# Patient Record
Sex: Female | Born: 1960 | Race: White | Hispanic: No | Marital: Married | Smoking: Never smoker
Health system: Southern US, Community
[De-identification: ages and names within clinical notes are randomized; demographics above are authoritative.]

## PROBLEM LIST (undated history)

## (undated) DIAGNOSIS — E559 Vitamin D deficiency, unspecified: Secondary | ICD-10-CM

## (undated) DIAGNOSIS — T4145XA Adverse effect of unspecified anesthetic, initial encounter: Secondary | ICD-10-CM

## (undated) DIAGNOSIS — Z87442 Personal history of urinary calculi: Secondary | ICD-10-CM

## (undated) DIAGNOSIS — R519 Headache, unspecified: Secondary | ICD-10-CM

## (undated) DIAGNOSIS — Z789 Other specified health status: Secondary | ICD-10-CM

## (undated) DIAGNOSIS — N200 Calculus of kidney: Secondary | ICD-10-CM

## (undated) DIAGNOSIS — R51 Headache: Secondary | ICD-10-CM

## (undated) DIAGNOSIS — T8859XA Other complications of anesthesia, initial encounter: Secondary | ICD-10-CM

## (undated) DIAGNOSIS — M419 Scoliosis, unspecified: Secondary | ICD-10-CM

## (undated) DIAGNOSIS — M1990 Unspecified osteoarthritis, unspecified site: Secondary | ICD-10-CM

## (undated) DIAGNOSIS — K219 Gastro-esophageal reflux disease without esophagitis: Secondary | ICD-10-CM

## (undated) DIAGNOSIS — I10 Essential (primary) hypertension: Secondary | ICD-10-CM

## (undated) HISTORY — DX: Vitamin D deficiency, unspecified: E55.9

## (undated) HISTORY — PX: FOOT SURGERY: SHX648

## (undated) HISTORY — PX: TUBAL LIGATION: SHX77

## (undated) HISTORY — PX: CHOLECYSTECTOMY: SHX55

## (undated) HISTORY — DX: Calculus of kidney: N20.0

## (undated) HISTORY — PX: ANKLE SURGERY: SHX546

## (undated) HISTORY — PX: KNEE SURGERY: SHX244

---

## 1996-10-12 HISTORY — PX: OTHER SURGICAL HISTORY: SHX169

## 2002-11-21 ENCOUNTER — Inpatient Hospital Stay (HOSPITAL_COMMUNITY): Admission: RE | Admit: 2002-11-21 | Discharge: 2002-11-22 | Payer: Self-pay | Admitting: Orthopedic Surgery

## 2002-11-21 ENCOUNTER — Encounter (INDEPENDENT_AMBULATORY_CARE_PROVIDER_SITE_OTHER): Payer: Self-pay

## 2002-12-20 ENCOUNTER — Ambulatory Visit (HOSPITAL_COMMUNITY): Admission: RE | Admit: 2002-12-20 | Discharge: 2002-12-20 | Payer: Self-pay | Admitting: Orthopedic Surgery

## 2003-07-23 ENCOUNTER — Ambulatory Visit (HOSPITAL_COMMUNITY): Admission: RE | Admit: 2003-07-23 | Discharge: 2003-07-23 | Payer: Self-pay | Admitting: Orthopedic Surgery

## 2003-07-23 ENCOUNTER — Encounter (INDEPENDENT_AMBULATORY_CARE_PROVIDER_SITE_OTHER): Payer: Self-pay | Admitting: Specialist

## 2004-08-19 ENCOUNTER — Ambulatory Visit (HOSPITAL_COMMUNITY): Admission: RE | Admit: 2004-08-19 | Discharge: 2004-08-19 | Payer: Self-pay | Admitting: Obstetrics and Gynecology

## 2006-01-06 ENCOUNTER — Ambulatory Visit (HOSPITAL_COMMUNITY): Admission: RE | Admit: 2006-01-06 | Discharge: 2006-01-06 | Payer: Self-pay | Admitting: Obstetrics and Gynecology

## 2008-02-27 ENCOUNTER — Other Ambulatory Visit: Admission: RE | Admit: 2008-02-27 | Discharge: 2008-02-27 | Payer: Self-pay | Admitting: Obstetrics and Gynecology

## 2009-02-27 ENCOUNTER — Other Ambulatory Visit: Admission: RE | Admit: 2009-02-27 | Discharge: 2009-02-27 | Payer: Self-pay | Admitting: Obstetrics and Gynecology

## 2009-03-07 ENCOUNTER — Ambulatory Visit (HOSPITAL_COMMUNITY): Admission: RE | Admit: 2009-03-07 | Discharge: 2009-03-07 | Payer: Self-pay | Admitting: Obstetrics & Gynecology

## 2010-06-09 ENCOUNTER — Ambulatory Visit (HOSPITAL_COMMUNITY): Admission: RE | Admit: 2010-06-09 | Discharge: 2010-06-09 | Payer: Self-pay | Admitting: Obstetrics & Gynecology

## 2010-07-28 ENCOUNTER — Other Ambulatory Visit: Admission: RE | Admit: 2010-07-28 | Discharge: 2010-07-28 | Payer: Self-pay | Admitting: Obstetrics and Gynecology

## 2010-11-02 ENCOUNTER — Encounter: Payer: Self-pay | Admitting: Obstetrics and Gynecology

## 2011-02-27 NOTE — Op Note (Signed)
Sarah Hicks, Sarah Hicks                         ACCOUNT NO.:  [id number]   MEDICAL RECORD NO.:  [id number]                   PATIENT TYPE:  AMB   LOCATION:  DAY                                  FACILITY:  [facility]   PHYSICIAN:  Marlowe Kays, M.D.               DATE OF BIRTH:  Dec 24, 1960   DATE OF PROCEDURE:  07/23/2003  DATE OF DISCHARGE:                                 OPERATIVE REPORT   PREOPERATIVE DIAGNOSIS:  Morton's neuroma, second-third, third-fourth web  spaces, right foot.   POSTOPERATIVE DIAGNOSIS:  Morton's neuroma, second-third, third and fourth  web spaces, right foot.   OPERATION/PROCEDURE:  Excision of Morton's neuroma, second-third, third and  fourth web spaces, right foot.   SURGEON:  Marlowe Kays, M.D.   ASSISTANT:  Nurse.   ANESTHESIA:  General.   PATHOLOGY AND INDICATIONS FOR PROCEDURE:  Following prior foot surgery and  presumably due to postoperative swelling and compression, she developed pain  in the second-third and third and fourth web spaces.  She has had  temporarily relief with Xylocaine and steroid injections, but because of a  persistent problems with disabling pain, she is here today for excision of  the two Morton's neuromas.  She was warned that there was increased risk of  devascularizing the third toe but since she was in good health overall and  is a nonsmoker, we elected to proceed with the surgery of one sitting.   DESCRIPTION OF PROCEDURE:  Satisfactory general anesthesia, pneumatic  tourniquet.  The leg was esmarched out nonsterilely.  Right foot and ankle  were prepped with DuraPrep, draped in a sterile field.  Dorsal web-splitting  incisions between second-third and third and fourth metatarsal heads with  blunt dissection to expose the common digital nerve.  The one at the second-  third was much more pronounces in terms of pathology than the third and  fourth.  In each case the common digital nerve was grasped with an Allis  clamp and the digital branches cut with the cutting cautery distally, and I  then freed up the nerve and cut as far proximally in the intermetatarsal  head area with the cutting cautery.  I then released the tourniquet.  There  was a slight amount of bleeding superficially which I corrected in both  wounds but there was some slight deep bleeding which we were unable to  locate and completely eradicate.  Also observed the third toe for  circulation and it did pink up nicely although not quite as pink as the  second and fourth toes.  I then infiltrated both wounds with 0.5% plain  Marcaine  and closed the subcutaneous tissue with interrupted 3-0 Vicryl and  skin with interrupted 4-0 nylon simple and mattress sutures.  Betadine and  dry sterile dressings were applied.  She tolerated the procedure well and at  the time of this dictation was on her way to recovery  room in satisfactory  condition with no complications.                                                 Marlowe Kays, M.D.   JA/MEDQ  D:  07/23/2003  T:  07/23/2003  Job:  [id number]

## 2011-02-27 NOTE — Op Note (Signed)
Sarah Hicks, Sarah Hicks                         ACCOUNT NO.:  [id number]   MEDICAL RECORD NO.:  [id number]                   PATIENT TYPE:  AMB   LOCATION:  DAY                                  FACILITY:  [facility]   PHYSICIAN:  Marlowe Kays, M.D.               DATE OF BIRTH:  1960-10-17   DATE OF PROCEDURE:  11/20/2002  DATE OF DISCHARGE:                                 OPERATIVE REPORT   PREOPERATIVE DIAGNOSES:  1. Popliteal cyst, left knee.  2. Possible ganglion cyst or cyst of peroneal tendon sheath, right ankle.   POSTOPERATIVE DIAGNOSES:  1. Popliteal cyst, left knee.  2. Extensive tear of peroneus brevis tendon, right ankle.   OPERATION PERFORMED:  1. Excision of popliteal cyst, left knee.  2. Repair of multiple tears, peroneus brevis tendon, right ankle.   SURGEON:  Marlowe Kays, M.D.   ASSISTANT:  Clarene Reamer, P.A.-C.   ANESTHESIA:  General.   DESCRIPTION OF PROCEDURE:  Today's problems were the result of injury at  work on December 01, 2001.  She has had MRI dated September 22, 2002 which  demonstrated moderate sized Baker's cyst with some mild chondromalacia of  the patella but no other internal derangement of the knee to suggest a cause  for her Baker's cyst other than a primary cause of trauma to the posterior  capsule.  She has had persistent cystic mass one larger than the other  measuring about 1 to 2 cm over the extensor brevis over the right ankle and  foot.  Both these problems were to be addressed today.   Satisfactory general anesthesia.  She was first placed in supine position  and pneumatic tourniquet applied to the right lower extremity and the right  foot and ankle prepped with DuraPrep and draped in the sterile field.  I  made a longitudinal incision along the course of the peroneal tendons and  the palpable mass.  I went through the subcutaneous tissue.  The peroneal  sheath was opened and it became clear that this was not a ganglion cyst  but  actually a fairly extensive tear of the peroneus brevis.  The tendon had  tried to repair itself but had partial herniation with rotation of the  tendon from the inside out.  The total extent of the tendon tear was almost  4 cm and went up well beneath the lateral malleolus.  There were several  longitudinal rents.  The peroneus longus tendon was well defined beneath it  and did not appear to have any injury.  After defining the pathology, I then  used 2-0 Ethibond and went from distal to proximal repairing one  longitudinal rent on the inner aspect of the tendon working up beneath the  lateral malleolus and using some sutures for traction until I felt that we  had gotten up to the tip of the longitudinal split.  She also had another  tear over a good bit of the tendon posteriorly and laterally which I then  repaired with 2-0 Ethibond as well.  During this repair process, I tried to  invaginate as much as I could of the bulbous portion of the tendon but also  then trimmed some of it down with a 15 knife blade at the conclusion of the  repair.  I irrigated the wound was sterile saline and infiltrated the wound  with 0.5% plain Marcaine.  The peroneal tendon sheath was reapproximated  with interrupted 3-0 Vicryl and the skin and subcutaneous tissue with  interrupted 4-0 nylon.  Betadine, Adaptic, dry sterile dressing and ABD pads  and Webril were then applied and we then left this portion of the operation  to put a cast on later.  By this time the tourniquet had been released and  we then removed it and I then applied it to the left lower extremity.  Then  we moved her over onto a stretcher in the supine position and then put rolls  on the operating room bed and moved her back into the prone position.  I  then put Esmarch on her leg out nonsterilely and inflated the tourniquet.  I  then prepped her from tourniquet to just above her ankle with DuraPrep and  draped in sterile field.   Through the lateral parapatellar approach I then  injected 30 cc of methylene blue and saline and moved the knee vigorously.  Then a lazy-S incision in the popliteal and with care went through the  posterior fascia and found the large robins egg cyst as outlined with the  dye.  I dissected most of it out with finger dissection and its base went  just lateral to the medial hamstrings.  I excised this space and we sent the  specimen to pathology.  I then closed the defect tightly with multiple  interrupted  #1 Vicryl sutures.  The wound was irrigated with sterile  saline.  There appeared to be no residual defect present.  I released the  tourniquet and there was no unusual bleeding.  We then reapproximated the  popliteal fascia with interrupted #1 Vicryl, the subcutaneous tissue with 3-  0 Vicryl and the skin with 3-0 and 4-0 nylon.  Betadine Adaptic dry sterile  dressing were applied and I then went and placed short leg splint cast using  5 x 30 splints for her right lower extremity.  We then moved her over onto  the operating room bed and went she was in the supine position, completed  the dressing and applied knee immobilizer on her left lower extremity.  The  patient tolerated the procedure well and was taken to the recovery room in  satisfactory condition with no known complications.                                                Marlowe Kays, M.D.    JA/MEDQ  D:  11/20/2002  T:  11/20/2002  Job:  [id number]

## 2011-08-14 ENCOUNTER — Other Ambulatory Visit: Payer: Self-pay | Admitting: Obstetrics & Gynecology

## 2011-08-14 DIAGNOSIS — Z1231 Encounter for screening mammogram for malignant neoplasm of breast: Secondary | ICD-10-CM

## 2011-09-10 ENCOUNTER — Other Ambulatory Visit (HOSPITAL_COMMUNITY)
Admission: RE | Admit: 2011-09-10 | Discharge: 2011-09-10 | Disposition: A | Discharge status: Home / Self Care | Payer: 59 | Source: Ambulatory Visit | Attending: Obstetrics and Gynecology | Admitting: Obstetrics and Gynecology

## 2011-09-10 ENCOUNTER — Other Ambulatory Visit: Payer: Self-pay | Admitting: Adult Health

## 2011-09-10 DIAGNOSIS — Z01419 Encounter for gynecological examination (general) (routine) without abnormal findings: Secondary | ICD-10-CM | POA: Insufficient documentation

## 2011-09-11 ENCOUNTER — Ambulatory Visit (HOSPITAL_COMMUNITY): Payer: Self-pay

## 2011-10-12 ENCOUNTER — Ambulatory Visit (HOSPITAL_COMMUNITY)
Admission: RE | Admit: 2011-10-12 | Discharge: 2011-10-12 | Disposition: A | Discharge status: Home / Self Care | Payer: 59 | Source: Ambulatory Visit | Attending: Obstetrics & Gynecology | Admitting: Obstetrics & Gynecology

## 2011-10-12 DIAGNOSIS — Z1231 Encounter for screening mammogram for malignant neoplasm of breast: Secondary | ICD-10-CM | POA: Insufficient documentation

## 2012-08-11 ENCOUNTER — Encounter (INDEPENDENT_AMBULATORY_CARE_PROVIDER_SITE_OTHER): Payer: Self-pay | Admitting: *Deleted

## 2012-09-12 ENCOUNTER — Other Ambulatory Visit (HOSPITAL_COMMUNITY)
Admission: RE | Admit: 2012-09-12 | Discharge: 2012-09-12 | Disposition: A | Discharge status: Home / Self Care | Payer: 59 | Source: Ambulatory Visit | Attending: Obstetrics and Gynecology | Admitting: Obstetrics and Gynecology

## 2012-09-12 ENCOUNTER — Other Ambulatory Visit: Payer: Self-pay | Admitting: Adult Health

## 2012-09-12 DIAGNOSIS — Z1151 Encounter for screening for human papillomavirus (HPV): Secondary | ICD-10-CM | POA: Insufficient documentation

## 2012-09-12 DIAGNOSIS — Z01419 Encounter for gynecological examination (general) (routine) without abnormal findings: Secondary | ICD-10-CM | POA: Insufficient documentation

## 2013-02-01 ENCOUNTER — Telehealth: Payer: Self-pay | Admitting: Adult Health

## 2013-02-01 NOTE — Telephone Encounter (Signed)
Pt states 52 years old and has not had a period sine  December of 2013 not having hot flashes or any other negative symptoms, irritability at times. Wants to know if she needs to come in for an appt or if she is menopausal. Per Cyril Mourning, NP states pt probably starting menopause but can make an appt to discuss in more detail. Pt informed and stated would call back if she decided to make an appt.

## 2013-06-14 ENCOUNTER — Encounter (INDEPENDENT_AMBULATORY_CARE_PROVIDER_SITE_OTHER): Payer: Self-pay | Admitting: *Deleted

## 2013-06-28 ENCOUNTER — Telehealth: Payer: Self-pay | Admitting: Adult Health

## 2013-06-28 NOTE — Telephone Encounter (Signed)
Pt really NEEDS to speak to JAG. Pt refused to speak to me.

## 2013-06-28 NOTE — Telephone Encounter (Signed)
Had questions about daughter who is pregnant will try to get daughter in this week

## 2013-07-06 ENCOUNTER — Encounter (INDEPENDENT_AMBULATORY_CARE_PROVIDER_SITE_OTHER): Payer: Self-pay | Admitting: *Deleted

## 2013-07-06 ENCOUNTER — Telehealth (INDEPENDENT_AMBULATORY_CARE_PROVIDER_SITE_OTHER): Payer: Self-pay | Admitting: *Deleted

## 2013-07-06 ENCOUNTER — Other Ambulatory Visit (INDEPENDENT_AMBULATORY_CARE_PROVIDER_SITE_OTHER): Payer: Self-pay | Admitting: *Deleted

## 2013-07-06 DIAGNOSIS — Z1211 Encounter for screening for malignant neoplasm of colon: Secondary | ICD-10-CM

## 2013-07-06 NOTE — Telephone Encounter (Signed)
Patient needs movi prep 

## 2013-07-07 MED ORDER — PEG-KCL-NACL-NASULF-NA ASC-C 100 G PO SOLR: 1.0000 | Freq: Once | ORAL | Status: DC

## 2013-08-24 ENCOUNTER — Telehealth (INDEPENDENT_AMBULATORY_CARE_PROVIDER_SITE_OTHER): Payer: Self-pay | Admitting: *Deleted

## 2013-08-24 NOTE — Telephone Encounter (Signed)
  Procedure: tcs  Reason/Indication:  screening  Has patient had this procedure before?  no  If so, when, by whom and where?    Is there a family history of colon cancer?  no  Who?  What age when diagnosed?    Is patient diabetic?   no      Does patient have prosthetic heart valve?  no  Do you have a pacemaker?  no  Has patient ever had endocarditis? no  Has patient had joint replacement within last 12 months?  no  Does patient tend to be constipated or take laxatives? no  Is patient on Coumadin, Plavix and/or Aspirin? Yes, asa prn  Medications: asa prn, tylenol, ibuprofen  Allergies: latex  Medication Adjustment: asa 2 days  Procedure date & time: 09/13/13 at 730

## 2013-08-24 NOTE — Telephone Encounter (Signed)
agree

## 2013-09-11 ENCOUNTER — Encounter (HOSPITAL_COMMUNITY): Payer: Self-pay | Admitting: Pharmacy Technician

## 2013-09-13 ENCOUNTER — Encounter (HOSPITAL_COMMUNITY): Payer: Self-pay

## 2013-09-13 ENCOUNTER — Encounter (HOSPITAL_COMMUNITY)
Admission: RE | Disposition: A | Discharge status: Home / Self Care | Payer: Self-pay | Source: Ambulatory Visit | Attending: Internal Medicine

## 2013-09-13 ENCOUNTER — Other Ambulatory Visit: Payer: Self-pay | Admitting: Adult Health

## 2013-09-13 ENCOUNTER — Ambulatory Visit (HOSPITAL_COMMUNITY)
Admission: RE | Admit: 2013-09-13 | Discharge: 2013-09-13 | Disposition: A | Discharge status: Home / Self Care | Payer: BC Managed Care – PPO | Source: Ambulatory Visit | Attending: Internal Medicine | Admitting: Internal Medicine

## 2013-09-13 DIAGNOSIS — Z1211 Encounter for screening for malignant neoplasm of colon: Secondary | ICD-10-CM

## 2013-09-13 DIAGNOSIS — K644 Residual hemorrhoidal skin tags: Secondary | ICD-10-CM | POA: Insufficient documentation

## 2013-09-13 DIAGNOSIS — K6389 Other specified diseases of intestine: Secondary | ICD-10-CM

## 2013-09-13 DIAGNOSIS — D126 Benign neoplasm of colon, unspecified: Secondary | ICD-10-CM

## 2013-09-13 HISTORY — DX: Other specified health status: Z78.9

## 2013-09-13 HISTORY — PX: COLONOSCOPY: SHX5424

## 2013-09-13 SURGERY — COLONOSCOPY
Anesthesia: Moderate Sedation

## 2013-09-13 MED ORDER — SODIUM CHLORIDE 0.9 % IV SOLN
INTRAVENOUS | Status: DC
  Administered 2013-09-13: 08:00:00 via INTRAVENOUS

## 2013-09-13 MED ORDER — MEPERIDINE HCL 50 MG/ML IJ SOLN
INTRAMUSCULAR | Status: AC
  Filled 2013-09-13: qty 1

## 2013-09-13 MED ORDER — STERILE WATER FOR IRRIGATION IR SOLN
Status: DC | PRN
  Administered 2013-09-13: 09:00:00

## 2013-09-13 MED ORDER — MEPERIDINE HCL 50 MG/ML IJ SOLN
INTRAMUSCULAR | Status: DC | PRN
Start: 2013-09-13 — End: 2013-09-13
  Administered 2013-09-13 (×2): 25 mg via INTRAVENOUS

## 2013-09-13 MED ORDER — MIDAZOLAM HCL 5 MG/5ML IJ SOLN
INTRAMUSCULAR | Status: DC | PRN
  Administered 2013-09-13 (×4): 2 mg via INTRAVENOUS

## 2013-09-13 MED ORDER — MIDAZOLAM HCL 5 MG/5ML IJ SOLN
INTRAMUSCULAR | Status: AC
  Filled 2013-09-13: qty 10

## 2013-09-13 NOTE — Op Note (Signed)
COLONOSCOPY PROCEDURE REPORT  PATIENT:  Sarah Hicks  MR#:  [id number] Birthdate:  05-02-1961, 52 y.o., female Endoscopist:  Dr. Malissa Hippo, MD Referred By:  Dr. Ignatius Specking, MD Procedure Date: 09/13/2013  Procedure:   Colonoscopy  Indications: Patient is 52 year old Caucasian female who is undergoing average risk screening colonoscopy.  Informed Consent:  The procedure and risks were reviewed with the patient and informed consent was obtained.  Medications:  Demerol 50 mg IV Versed 8 mg IV  Description of procedure:  After a digital rectal exam was performed, that colonoscope was advanced from the anus through the rectum and colon to the area of the cecum, ileocecal valve and appendiceal orifice. The cecum was deeply intubated. These structures were well-seen and photographed for the record. From the level of the cecum and ileocecal valve, the scope was slowly and cautiously withdrawn. The mucosal surfaces were carefully surveyed utilizing scope tip to flexion to facilitate fold flattening as needed. The scope was pulled down into the rectum where a thorough exam including retroflexion was performed.  Findings:   Prep satisfactory. Small polyps ablated via cold biopsy from transverse colon. No other mucosal abnormalities noted. Normal rectal mucosa. Small hemorrhoids below the dentate line and two anal papillae.   Therapeutic/Diagnostic Maneuvers Performed:  See above  Complications:  None  Cecal Withdrawal Time:  11 minutes  Impression:  Examination performed to cecum. Small polyp there are cold biopsy from transverse colon. Small external hemorrhoids and two anal papillae.  Recommendations:  Standard instructions given. I will contact patient with biopsy results and further recommendations.  Daryana Whirley U  09/13/2013 9:12 AM  CC: Dr. Ignatius Specking., MD & Dr. Bonnetta Barry ref. provider found

## 2013-09-13 NOTE — H&P (Signed)
Sarah Hicks is an 52 y.o. female.   Chief Complaint: Patient is here for colonoscopy. HPI: Patient is 52 year old Caucasian female who is here for screening colonoscopy. She denies abdominal pain change in bowel habits or rectal bleeding. Family history is negative for CRC.  Past Medical History  Diagnosis Date  . Medical history non-contributory     Past Surgical History  Procedure Laterality Date  . Cholecystectomy  17 years ago  . Knee surgery Left 10 years ago  . Ankle surgery Right 10 year ago  . Tubal ligation  19 years ago    History reviewed. No pertinent family history. Social History:  reports that she has never smoked. She does not have any smokeless tobacco history on file. She reports that she does not drink alcohol or use illicit drugs.  Allergies:  Allergies  Allergen Reactions  . Latex     Medications Prior to Admission  Medication Sig Dispense Refill  . peg 3350 powder (MOVIPREP) 100 G SOLR Take 1 kit (200 g total) by mouth once.  1 kit  0    No results found for this or any previous visit (from the past 48 hour(s)). No results found.  ROS  Blood pressure 131/79, pulse 69, temperature 97.6 F (36.4 C), temperature source Oral, resp. rate 20, height 5\' 4"  (1.626 m), weight 189 lb (85.73 kg), last menstrual period 04/13/2013, SpO2 97.00%. Physical Exam  Constitutional: She appears well-developed and well-nourished.  HENT:  Mouth/Throat: Oropharynx is clear and moist.  Eyes: Conjunctivae are normal. No scleral icterus.  Neck: No thyromegaly present.  Cardiovascular: Normal rate, regular rhythm and normal heart sounds.   No murmur heard. Respiratory: Effort normal and breath sounds normal.  GI: Soft. She exhibits no distension and no mass. There is no tenderness.  Musculoskeletal: She exhibits no edema.  Lymphadenopathy:    She has no cervical adenopathy.  Neurological: She is alert.  Skin: Skin is warm and dry.     Assessment/Plan Average risk  screening colonoscopy.  REHMAN,NAJEEB U 09/13/2013, 8:30 AM

## 2013-09-18 ENCOUNTER — Encounter (INDEPENDENT_AMBULATORY_CARE_PROVIDER_SITE_OTHER): Payer: Self-pay | Admitting: *Deleted

## 2013-09-19 ENCOUNTER — Encounter (HOSPITAL_COMMUNITY): Payer: Self-pay | Admitting: Internal Medicine

## 2013-09-27 ENCOUNTER — Ambulatory Visit (INDEPENDENT_AMBULATORY_CARE_PROVIDER_SITE_OTHER): Payer: BC Managed Care – PPO | Admitting: Adult Health

## 2013-09-27 ENCOUNTER — Encounter: Payer: Self-pay | Admitting: Adult Health

## 2013-09-27 VITALS — BP 140/78 | HR 78 | Ht 64.0 in | Wt 195.0 lb

## 2013-09-27 DIAGNOSIS — Z01419 Encounter for gynecological examination (general) (routine) without abnormal findings: Secondary | ICD-10-CM

## 2013-09-27 NOTE — Progress Notes (Addendum)
Patient ID: Sarah Hicks, female   DOB: 1961/07/19, 52 y.o.   MRN: [id number] History of Present Illness: Sarah Hicks is a 52 year old white female, married in for a physical.She a normal pap with negative HPV 09/13/12.Had colonoscopy 09/2013 and had 1 3 mm polyp and needs repeat in 7 years.  Current Medications, Allergies, Past Medical History, Past Surgical History, Family History and Social History were reviewed in [employer] record.     Review of Systems: Patient denies any headaches, blurred vision, shortness of breath, chest pain, abdominal pain, problems with bowel movements, urination, or intercourse. No mood changes, has some pain right knee sprained it recently.Has occasional hot flash and LMP July 2014.    Physical Exam:BP 140/78  Pulse 78  Ht 5\' 4"  (1.626 m)  Wt 195 lb (88.451 kg)  BMI 33.46 kg/m2  LMP 04/13/2013 General:  Well developed, well nourished, no acute distress Skin:  Warm and dry Neck:  Midline trachea, normal thyroid Lungs; Clear to auscultation bilaterally Breast:  No dominant palpable mass, retraction, or nipple discharge Cardiovascular: Regular rate and rhythm Abdomen:  Soft, non tender, no hepatosplenomegaly Pelvic:  External genitalia is normal in appearance.  The vagina is normal in appearance. The cervix is bulbous.  Uterus is felt to be normal size, shape, and contour.  No  adnexal masses or tenderness noted. Rectal: Good sphincter tone, no polyps, or hemorrhoids felt.  Hemoccult negative. Extremities:  No varicosities noted, has some swelling right knee. Psych:  No mood changes, alert and cooperative seems happy   Impression: Yearly gyn exam no pap  Plan: Physical in 1 year Mammogram due in 10/2013 Colonoscopy 2021 Labs with PCP Call prn, discussed if no bleeding in 366 and has bleeding call me

## 2013-09-27 NOTE — Patient Instructions (Signed)
Physical in 1 year mammogram in 2015 Colonoscopy 2021 Labs with PCP

## 2014-03-06 ENCOUNTER — Telehealth: Payer: Self-pay

## 2014-03-06 NOTE — Telephone Encounter (Signed)
Spoke with pt. Pt thinks she has a yeast infection. I advised she would need to be seen. Call transferred to front desk to schedule an appt. Pt voiced understanding. [place]

## 2014-03-08 ENCOUNTER — Telehealth: Payer: Self-pay | Admitting: Adult Health

## 2014-03-08 MED ORDER — FLUCONAZOLE 150 MG PO TABS: ORAL_TABLET | ORAL | Status: DC

## 2014-03-08 NOTE — Telephone Encounter (Signed)
Complains of yeast infection after antibiotic for sinus infection,will rx diflucan

## 2014-08-13 ENCOUNTER — Encounter: Payer: Self-pay | Admitting: Adult Health

## 2014-09-20 ENCOUNTER — Other Ambulatory Visit: Payer: Self-pay | Admitting: Obstetrics and Gynecology

## 2014-09-20 DIAGNOSIS — Z1231 Encounter for screening mammogram for malignant neoplasm of breast: Secondary | ICD-10-CM

## 2014-10-08 ENCOUNTER — Ambulatory Visit (HOSPITAL_COMMUNITY)
Admission: RE | Admit: 2014-10-08 | Discharge: 2014-10-08 | Disposition: A | Discharge status: Home / Self Care | Payer: BC Managed Care – PPO | Source: Ambulatory Visit | Attending: Obstetrics and Gynecology | Admitting: Obstetrics and Gynecology

## 2014-10-08 DIAGNOSIS — Z1231 Encounter for screening mammogram for malignant neoplasm of breast: Secondary | ICD-10-CM | POA: Diagnosis not present

## 2014-10-10 ENCOUNTER — Ambulatory Visit (INDEPENDENT_AMBULATORY_CARE_PROVIDER_SITE_OTHER): Payer: BC Managed Care – PPO | Admitting: Adult Health

## 2014-10-10 ENCOUNTER — Encounter: Payer: Self-pay | Admitting: Adult Health

## 2014-10-10 VITALS — BP 132/82 | HR 78 | Ht 64.0 in | Wt 206.0 lb

## 2014-10-10 DIAGNOSIS — Z01419 Encounter for gynecological examination (general) (routine) without abnormal findings: Secondary | ICD-10-CM

## 2014-10-10 NOTE — Patient Instructions (Addendum)
Pap and physical in 1 year Mammogram yearly Colonoscopy 2021 Labs with PCP

## 2014-10-10 NOTE — Progress Notes (Signed)
Patient ID: Sarah Hicks, female   DOB: 1961-10-07, 53 y.o.   MRN: [id number] History of Present Illness: Sarah Hicks is a 53 year old white female, married in for gyn physical.She had a normal pap with negative HPV 09/12/12.She had labs with PCP and did 3 hemoccult cards that were negative.She had a colonoscopy 2014 and had a polyp, will get F/U in 7 years.Had mammogram 12/28 at [facility].   Current Medications, Allergies, Past Medical History, Past Surgical History, Family History and Social History were reviewed in [employer] record.     Review of Systems: Patient denies any headaches, blurred vision, shortness of breath, chest pain, abdominal pain, problems with bowel movements, urination, or intercourse. No joint pain or mood swings.    Physical Exam:BP 132/82 mmHg  Pulse 78  Ht 5\' 4"  (1.626 m)  Wt 206 lb (93.441 kg)  BMI 35.34 kg/m2  LMP 04/13/2013 General:  Well developed, well nourished, no acute distress Skin:  Warm and dry Neck:  Midline trachea, normal thyroid Lungs; Clear to auscultation bilaterally Breast:  No dominant palpable mass, retraction, or nipple discharge Cardiovascular: Regular rate and rhythm Abdomen:  Soft, non tender, no hepatosplenomegaly Pelvic:  External genitalia is normal in appearance.  The vagina is pale with loss of moisture and rugae. The cervix is smooth.  Uterus is felt to be normal size, shape, and contour.  No  adnexal masses or tenderness noted. Rectal: deferred  Extremities:  No swelling or varicosities noted Psych:  No mood changes,alert and cooperative,seems happy   Impression: Well woman gyn exam no pap    Plan: Pap and physical in 1 year Mammogram yearly  Colonoscopy 2021 Labs with PCP

## 2014-12-06 ENCOUNTER — Emergency Department (HOSPITAL_COMMUNITY)
Admission: EM | Admit: 2014-12-06 | Discharge: 2014-12-06 | Disposition: A | Discharge status: Home / Self Care | Payer: Worker's Compensation | Attending: Emergency Medicine | Admitting: Emergency Medicine

## 2014-12-06 ENCOUNTER — Encounter (HOSPITAL_COMMUNITY): Payer: Self-pay

## 2014-12-06 DIAGNOSIS — Y9389 Activity, other specified: Secondary | ICD-10-CM | POA: Insufficient documentation

## 2014-12-06 DIAGNOSIS — S199XXA Unspecified injury of neck, initial encounter: Secondary | ICD-10-CM | POA: Diagnosis not present

## 2014-12-06 DIAGNOSIS — S060X0A Concussion without loss of consciousness, initial encounter: Secondary | ICD-10-CM | POA: Diagnosis not present

## 2014-12-06 DIAGNOSIS — Y990 Civilian activity done for income or pay: Secondary | ICD-10-CM | POA: Insufficient documentation

## 2014-12-06 DIAGNOSIS — Y9289 Other specified places as the place of occurrence of the external cause: Secondary | ICD-10-CM | POA: Insufficient documentation

## 2014-12-06 DIAGNOSIS — W01198A Fall on same level from slipping, tripping and stumbling with subsequent striking against other object, initial encounter: Secondary | ICD-10-CM | POA: Diagnosis not present

## 2014-12-06 DIAGNOSIS — S0990XA Unspecified injury of head, initial encounter: Secondary | ICD-10-CM | POA: Diagnosis present

## 2014-12-06 DIAGNOSIS — Z91040 Latex allergy status: Secondary | ICD-10-CM | POA: Diagnosis not present

## 2014-12-06 MED ORDER — ONDANSETRON 4 MG PO TBDP
4.0000 mg | ORAL_TABLET | Freq: Once | ORAL | Status: AC
  Administered 2014-12-06: 4 mg via ORAL
  Filled 2014-12-06: qty 1

## 2014-12-06 MED ORDER — ONDANSETRON 4 MG PO TBDP: ORAL_TABLET | ORAL | Status: DC
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
## 2014-12-06 NOTE — ED Notes (Signed)
Pt reports a 2x4 fell and hit her on the top of her head at work Tuesday.  Denies any LOC but reports got lightheaded and nauseated initially.  Reports since then had been tired and nauseated.  Also c/o headache.

## 2014-12-06 NOTE — ED Notes (Signed)
MD at bedside. 

## 2014-12-06 NOTE — Discharge Instructions (Signed)
Concussion A concussion, or closed-head injury, is a brain injury caused by a direct blow to the head or by a quick and sudden movement (jolt) of the head or neck. Concussions are usually not life-threatening. Even so, the effects of a concussion can be serious. If you have had a concussion before, you are more likely to experience concussion-like symptoms after a direct blow to the head.  CAUSES  Direct blow to the head, such as from running into another player during a soccer game, being hit in a fight, or hitting your head on a hard surface.  A jolt of the head or neck that causes the brain to move back and forth inside the skull, such as in a car crash. SIGNS AND SYMPTOMS The signs of a concussion can be hard to notice. Early on, they may be missed by you, family members, and health care providers. You may look fine but act or feel differently. Symptoms are usually temporary, but they may last for days, weeks, or even longer. Some symptoms may appear right away while others may not show up for hours or days. Every head injury is different. Symptoms include:  Mild to moderate headaches that will not go away.  A feeling of pressure inside your head.  Having more trouble than usual:  Learning or remembering things you have heard.  Answering questions.  Paying attention or concentrating.  Organizing daily tasks.  Making decisions and solving problems.  Slowness in thinking, acting or reacting, speaking, or reading.  Getting lost or being easily confused.  Feeling tired all the time or lacking energy (fatigued).  Feeling drowsy.  Sleep disturbances.  Sleeping more than usual.  Sleeping less than usual.  Trouble falling asleep.  Trouble sleeping (insomnia).  Loss of balance or feeling lightheaded or dizzy.  Nausea or vomiting.  Numbness or tingling.  Increased sensitivity to:  Sounds.  Lights.  Distractions.  Vision problems or eyes that tire  easily.  Diminished sense of taste or smell.  Ringing in the ears.  Mood changes such as feeling sad or anxious.  Becoming easily irritated or angry for little or no reason.  Lack of motivation.  Seeing or hearing things other people do not see or hear (hallucinations). DIAGNOSIS Your health care provider can usually diagnose a concussion based on a description of your injury and symptoms. He or she will ask whether you passed out (lost consciousness) and whether you are having trouble remembering events that happened right before and during your injury. Your evaluation might include:  A brain scan to look for signs of injury to the brain. Even if the test shows no injury, you may still have a concussion.  Blood tests to be sure other problems are not present. TREATMENT  Concussions are usually treated in an emergency department, in urgent care, or at a clinic. You may need to stay in the hospital overnight for further treatment.  Tell your health care provider if you are taking any medicines, including prescription medicines, over-the-counter medicines, and natural remedies. Some medicines, such as blood thinners (anticoagulants) and aspirin, may increase the chance of complications. Also tell your health care provider whether you have had alcohol or are taking illegal drugs. This information may affect treatment.  Your health care provider will send you home with important instructions to follow.  How fast you will recover from a concussion depends on many factors. These factors include how severe your concussion is, what part of your brain was injured, your  age, and how healthy you were before the concussion.  Most people with mild injuries recover fully. Recovery can take time. In general, recovery is slower in older persons. Also, persons who have had a concussion in the past or have other medical problems may find that it takes longer to recover from their current injury. HOME  CARE INSTRUCTIONS General Instructions  Carefully follow the directions your health care provider gave you.  Only take over-the-counter or prescription medicines for pain, discomfort, or fever as directed by your health care provider.  Take only those medicines that your health care provider has approved.  Do not drink alcohol until your health care provider says you are well enough to do so. Alcohol and certain other drugs may slow your recovery and can put you at risk of further injury.  If it is harder than usual to remember things, write them down.  If you are easily distracted, try to do one thing at a time. For example, do not try to watch TV while fixing dinner.  Talk with family members or close friends when making important decisions.  Keep all follow-up appointments. Repeated evaluation of your symptoms is recommended for your recovery.  Watch your symptoms and tell others to do the same. Complications sometimes occur after a concussion. Older adults with a brain injury may have a higher risk of serious complications, such as a blood clot on the brain.  Tell your teachers, school nurse, school counselor, coach, athletic trainer, or work Freight forwarder about your injury, symptoms, and restrictions. Tell them about what you can or cannot do. They should watch for:  Increased problems with attention or concentration.  Increased difficulty remembering or learning new information.  Increased time needed to complete tasks or assignments.  Increased irritability or decreased ability to cope with stress.  Increased symptoms.  Rest. Rest helps the brain to heal. Make sure you:  Get plenty of sleep at night. Avoid staying up late at night.  Keep the same bedtime hours on weekends and weekdays.  Rest during the day. Take daytime naps or rest breaks when you feel tired.  Limit activities that require a lot of thought or concentration. These include:  Doing homework or job-related  work.  Watching TV.  Working on the computer.  Avoid any situation where there is potential for another head injury (football, hockey, soccer, basketball, martial arts, downhill snow sports and horseback riding). Your condition will get worse every time you experience a concussion. You should avoid these activities until you are evaluated by the appropriate follow-up health care providers. Returning To Your Regular Activities You will need to return to your normal activities slowly, not all at once. You must give your body and brain enough time for recovery.  Do not return to sports or other athletic activities until your health care provider tells you it is safe to do so.  Ask your health care provider when you can drive, ride a bicycle, or operate heavy machinery. Your ability to react may be slower after a brain injury. Never do these activities if you are dizzy.  Ask your health care provider about when you can return to work or school. Preventing Another Concussion It is very important to avoid another brain injury, especially before you have recovered. In rare cases, another injury can lead to permanent brain damage, brain swelling, or death. The risk of this is greatest during the first 7-10 days after a head injury. Avoid injuries by:  Wearing a seat  belt when riding in a car.  Drinking alcohol only in moderation.  Wearing a helmet when biking, skiing, skateboarding, skating, or doing similar activities.  Avoiding activities that could lead to a second concussion, such as contact or recreational sports, until your health care provider says it is okay.  Taking safety measures in your home.  Remove clutter and tripping hazards from floors and stairways.  Use grab bars in bathrooms and handrails by stairs.  Place non-slip mats on floors and in bathtubs.  Improve lighting in dim areas. SEEK MEDICAL CARE IF:  You have increased problems paying attention or  concentrating.  You have increased difficulty remembering or learning new information.  You need more time to complete tasks or assignments than before.  You have increased irritability or decreased ability to cope with stress.  You have more symptoms than before. Seek medical care if you have any of the following symptoms for more than 2 weeks after your injury:  Lasting (chronic) headaches.  Dizziness or balance problems.  Nausea.  Vision problems.  Increased sensitivity to noise or light.  Depression or mood swings.  Anxiety or irritability.  Memory problems.  Difficulty concentrating or paying attention.  Sleep problems.  Feeling tired all the time. SEEK IMMEDIATE MEDICAL CARE IF:  You have severe or worsening headaches. These may be a sign of a blood clot in the brain.  You have weakness (even if only in one hand, leg, or part of the face).  You have numbness.  You have decreased coordination.  You vomit repeatedly.  You have increased sleepiness.  One pupil is larger than the other.  You have convulsions.  You have slurred speech.  You have increased confusion. This may be a sign of a blood clot in the brain.  You have increased restlessness, agitation, or irritability.  You are unable to recognize people or places.  You have neck pain.  It is difficult to wake you up.  You have unusual behavior changes.  You lose consciousness. MAKE SURE YOU:  Understand these instructions.  Will watch your condition.  Will get help right away if you are not doing well or get worse. Document Released: 12/19/2003 Document Revised: 10/03/2013 Document Reviewed: 04/20/2013 [facility] Patient Information 2015 [place], [place]. This information is not intended to replace advice given to you by your health care provider. Make sure you discuss any questions you have with your health care provider.      Head Injury You have received a head injury. It does  not appear serious at this time. Headaches and vomiting are common following head injury. It should be easy to awaken from sleeping. Sometimes it is necessary for you to stay in the emergency department for a while for observation. Sometimes admission to the hospital may be needed. After injuries such as yours, most problems occur within the first 24 hours, but side effects may occur up to 7-10 days after the injury. It is important for you to carefully monitor your condition and contact your health care provider or seek immediate medical care if there is a change in your condition. WHAT ARE THE TYPES OF HEAD INJURIES? Head injuries can be as minor as a bump. Some head injuries can be more severe. More severe head injuries include:  A jarring injury to the brain (concussion).  A bruise of the brain (contusion). This mean there is bleeding in the brain that can cause swelling.  A cracked skull (skull fracture).  Bleeding in the brain  that collects, clots, and forms a bump (hematoma). WHAT CAUSES A HEAD INJURY? A serious head injury is most likely to happen to someone who is in a car wreck and is not wearing a seat belt. Other causes of major head injuries include bicycle or motorcycle accidents, sports injuries, and falls. HOW ARE HEAD INJURIES DIAGNOSED? A complete history of the event leading to the injury and your current symptoms will be helpful in diagnosing head injuries. Many times, pictures of the brain, such as CT or MRI are needed to see the extent of the injury. Often, an overnight hospital stay is necessary for observation.  WHEN SHOULD I SEEK IMMEDIATE MEDICAL CARE?  You should get help right away if:  You have confusion or drowsiness.  You feel sick to your stomach (nauseous) or have continued, forceful vomiting.  You have dizziness or unsteadiness that is getting worse.  You have severe, continued headaches not relieved by medicine. Only take over-the-counter or prescription  medicines for pain, fever, or discomfort as directed by your health care provider.  You do not have normal function of the arms or legs or are unable to walk.  You notice changes in the black spots in the center of the colored part of your eye (pupil).  You have a clear or bloody fluid coming from your nose or ears.  You have a loss of vision. During the next 24 hours after the injury, you must stay with someone who can watch you for the warning signs. This person should contact local emergency services (911 in the [place] if you have seizures, you become unconscious, or you are unable to wake up. HOW CAN I PREVENT A HEAD INJURY IN THE FUTURE? The most important factor for preventing major head injuries is avoiding motor vehicle accidents. To minimize the potential for damage to your head, it is crucial to wear seat belts while riding in motor vehicles. Wearing helmets while bike riding and playing collision sports (like football) is also helpful. Also, avoiding dangerous activities around the house will further help reduce your risk of head injury.  WHEN CAN I RETURN TO NORMAL ACTIVITIES AND ATHLETICS? You should be reevaluated by your health care provider before returning to these activities. If you have any of the following symptoms, you should not return to activities or contact sports until 1 week after the symptoms have stopped:  Persistent headache.  Dizziness or vertigo.  Poor attention and concentration.  Confusion.  Memory problems.  Nausea or vomiting.  Fatigue or tire easily.  Irritability.  Intolerant of bright lights or loud noises.  Anxiety or depression.  Disturbed sleep. MAKE SURE YOU:   Understand these instructions.  Will watch your condition.  Will get help right away if you are not doing well or get worse. Document Released: 09/28/2005 Document Revised: 10/03/2013 Document Reviewed: 06/05/2013 [facility] Patient Information 2015 [place], [place]. This  information is not intended to replace advice given to you by your health care provider. Make sure you discuss any questions you have with your health care provider.

## 2016-03-23 ENCOUNTER — Ambulatory Visit (INDEPENDENT_AMBULATORY_CARE_PROVIDER_SITE_OTHER): Payer: 59 | Admitting: Adult Health

## 2016-03-23 ENCOUNTER — Other Ambulatory Visit: Payer: Self-pay | Admitting: Adult Health

## 2016-03-23 ENCOUNTER — Encounter: Payer: Self-pay | Admitting: Adult Health

## 2016-03-23 ENCOUNTER — Telehealth: Payer: Self-pay | Admitting: *Deleted

## 2016-03-23 ENCOUNTER — Telehealth: Payer: Self-pay | Admitting: Adult Health

## 2016-03-23 ENCOUNTER — Other Ambulatory Visit (HOSPITAL_COMMUNITY)
Admission: RE | Admit: 2016-03-23 | Discharge: 2016-03-23 | Disposition: A | Discharge status: Home / Self Care | Payer: 59 | Source: Ambulatory Visit | Attending: Adult Health | Admitting: Adult Health

## 2016-03-23 VITALS — BP 132/78 | HR 80 | Ht 64.25 in | Wt 214.0 lb

## 2016-03-23 DIAGNOSIS — Z01419 Encounter for gynecological examination (general) (routine) without abnormal findings: Secondary | ICD-10-CM | POA: Insufficient documentation

## 2016-03-23 DIAGNOSIS — Z1231 Encounter for screening mammogram for malignant neoplasm of breast: Secondary | ICD-10-CM

## 2016-03-23 DIAGNOSIS — Z139 Encounter for screening, unspecified: Secondary | ICD-10-CM

## 2016-03-23 DIAGNOSIS — Z1211 Encounter for screening for malignant neoplasm of colon: Secondary | ICD-10-CM

## 2016-03-23 DIAGNOSIS — Z1151 Encounter for screening for human papillomavirus (HPV): Secondary | ICD-10-CM | POA: Insufficient documentation

## 2016-03-23 LAB — HEMOCCULT GUIAC POC 1CARD (OFFICE): FECAL OCCULT BLD: NEGATIVE

## 2016-03-23 NOTE — Patient Instructions (Signed)
Physical in 1 year,pap in 3 if normal  Mammogram yearly

## 2016-03-23 NOTE — Telephone Encounter (Signed)
Pt aware Hep C was added to labs. [place]

## 2016-03-23 NOTE — Telephone Encounter (Signed)
[facility] [place]

## 2016-03-23 NOTE — Telephone Encounter (Signed)
Will add Hept C antibody

## 2016-03-23 NOTE — Telephone Encounter (Signed)
Message left that mammogram done at the [facility] in [place].

## 2016-03-23 NOTE — Progress Notes (Signed)
Patient ID: Sarah Hicks, female   DOB: 1961/02/10, 55 y.o.   MRN: [id number] History of Present Illness: Sarah Hicks is a 55 year old white female, married in for a well woman gyn exam and pap.   Current Medications, Allergies, Past Medical History, Past Surgical History, Family History and Social History were reviewed in [employer] record.     Review of Systems: Patient denies any headaches, hearing loss, fatigue, blurred vision, shortness of breath, chest pain, abdominal pain, problems with bowel movements, urination, or intercourse. No joint pain or mood swings. She has had recent fire ant bite right inner ankle and had to have antibiotics.   Physical Exam:BP 132/78 mmHg  Pulse 80  Ht 5' 4.25" (1.632 m)  Wt 214 lb (97.07 kg)  BMI 36.45 kg/m2  LMP 04/13/2013 General:  Well developed, well nourished, no acute distress Skin:  Warm and dry Neck:  Midline trachea, normal thyroid, good ROM, no lymphadenopathy Lungs; Clear to auscultation bilaterally Breast:  No dominant palpable mass, retraction, or nipple discharge Cardiovascular: Regular rate and rhythm Abdomen:  Soft, non tender, no hepatosplenomegaly Pelvic:  External genitalia is normal in appearance, no lesions.  The vagina is normal in appearance. Urethra has no lesions or masses. The cervix is bulbous,pap with HPV performed.  Uterus is felt to be normal size, shape, and contour.  No adnexal masses or tenderness noted.Bladder is non tender, no masses felt. Rectal: Good sphincter tone, no polyps, or hemorrhoids felt.  Hemoccult negative. Extremities/musculoskeletal:  No swelling or varicosities noted, no clubbing or cyanosis,some skin color changes right inner ankle from fire [place] Psych:  No mood changes, alert and cooperative,seems happy   Impression: Well woman gyn exam and pap    Plan: Check CBC,CMP,TSH and lipids,A1c and vitamin D Physical in 1 year, pap in 3 if  normal Mammogram yearly  Colonoscopy per GI

## 2016-03-24 LAB — CBC
HEMATOCRIT: 43.1 % (ref 34.0–46.6)
Hemoglobin: 14 g/dL (ref 11.1–15.9)
MCH: 28.5 pg (ref 26.6–33.0)
MCHC: 32.5 g/dL (ref 31.5–35.7)
MCV: 88 fL (ref 79–97)
Platelets: 292 10*3/uL (ref 150–379)
RBC: 4.91 x10E6/uL (ref 3.77–5.28)
RDW: 14.2 % (ref 12.3–15.4)
WBC: 8.7 10*3/uL (ref 3.4–10.8)

## 2016-03-24 LAB — COMPREHENSIVE METABOLIC PANEL
ALK PHOS: 112 IU/L (ref 39–117)
ALT: 47 IU/L — AB (ref 0–32)
AST: 29 IU/L (ref 0–40)
Albumin/Globulin Ratio: 1.4 (ref 1.2–2.2)
Albumin: 4.3 g/dL (ref 3.5–5.5)
BUN/Creatinine Ratio: 14 (ref 9–23)
BUN: 11 mg/dL (ref 6–24)
Bilirubin Total: 0.3 mg/dL (ref 0.0–1.2)
CO2: 26 mmol/L (ref 18–29)
CREATININE: 0.76 mg/dL (ref 0.57–1.00)
Calcium: 9.4 mg/dL (ref 8.7–10.2)
Chloride: 102 mmol/L (ref 96–106)
GFR, EST AFRICAN AMERICAN: 103 mL/min/{1.73_m2} (ref >59)
GFR, EST NON AFRICAN AMERICAN: 89 mL/min/{1.73_m2} (ref >59)
GLOBULIN, TOTAL: 3 g/dL (ref 1.5–4.5)
Glucose: 87 mg/dL (ref 65–99)
Potassium: 4.7 mmol/L (ref 3.5–5.2)
Sodium: 141 mmol/L (ref 134–144)
Total Protein: 7.3 g/dL (ref 6.0–8.5)

## 2016-03-24 LAB — HEMOGLOBIN A1C
Est. average glucose Bld gHb Est-mCnc: 117 mg/dL
Hgb A1c MFr Bld: 5.7 % — ABNORMAL HIGH (ref 4.8–5.6)

## 2016-03-24 LAB — VITAMIN D 25 HYDROXY (VIT D DEFICIENCY, FRACTURES): Vit D, 25-Hydroxy: 27.3 ng/mL — ABNORMAL LOW (ref 30.0–100.0)

## 2016-03-24 LAB — LIPID PANEL
CHOL/HDL RATIO: 3 ratio (ref 0.0–4.4)
Cholesterol, Total: 222 mg/dL — ABNORMAL HIGH (ref 100–199)
HDL: 73 mg/dL (ref >39)
LDL CALC: 114 mg/dL — AB (ref 0–99)
TRIGLYCERIDES: 174 mg/dL — AB (ref 0–149)
VLDL Cholesterol Cal: 35 mg/dL (ref 5–40)

## 2016-03-24 LAB — CYTOLOGY - PAP

## 2016-03-24 LAB — TSH: TSH: 4.19 u[IU]/mL (ref 0.450–4.500)

## 2016-03-25 ENCOUNTER — Encounter: Payer: Self-pay | Admitting: Adult Health

## 2016-03-25 ENCOUNTER — Telehealth: Payer: Self-pay | Admitting: Adult Health

## 2016-03-25 DIAGNOSIS — E559 Vitamin D deficiency, unspecified: Secondary | ICD-10-CM | POA: Insufficient documentation

## 2016-03-25 HISTORY — DX: Vitamin D deficiency, unspecified: E55.9

## 2016-03-25 LAB — HEPATITIS C ANTIBODY

## 2016-03-25 LAB — SPECIMEN STATUS REPORT

## 2016-03-25 MED ORDER — CHOLECALCIFEROL 125 MCG (5000 UT) PO CAPS: 5000.0000 [IU] | ORAL_CAPSULE | Freq: Every day | ORAL | Status: DC

## 2016-03-25 NOTE — Telephone Encounter (Signed)
Pt aware of labs, needs to increase vitamin D3 to 5000 iu ,decreased carbs and fats,aware pap normal with negative HPV

## 2016-03-27 ENCOUNTER — Ambulatory Visit
Admission: RE | Admit: 2016-03-27 | Discharge: 2016-03-27 | Disposition: A | Discharge status: Home / Self Care | Payer: 59 | Source: Ambulatory Visit | Attending: Adult Health | Admitting: Adult Health

## 2016-03-27 DIAGNOSIS — Z1231 Encounter for screening mammogram for malignant neoplasm of breast: Secondary | ICD-10-CM

## 2016-04-06 ENCOUNTER — Telehealth: Payer: Self-pay | Admitting: Adult Health

## 2016-04-06 NOTE — Telephone Encounter (Signed)
Spoke with pt letting her know mammogram was normal. Pt voiced understanding. JSY 

## 2017-05-20 ENCOUNTER — Telehealth: Payer: Self-pay | Admitting: Adult Health

## 2017-05-20 ENCOUNTER — Other Ambulatory Visit: Payer: Self-pay | Admitting: Adult Health

## 2017-05-20 DIAGNOSIS — E7800 Pure hypercholesterolemia, unspecified: Secondary | ICD-10-CM

## 2017-05-20 DIAGNOSIS — Z131 Encounter for screening for diabetes mellitus: Secondary | ICD-10-CM

## 2017-05-20 DIAGNOSIS — Z01419 Encounter for gynecological examination (general) (routine) without abnormal findings: Secondary | ICD-10-CM

## 2017-05-20 DIAGNOSIS — E559 Vitamin D deficiency, unspecified: Secondary | ICD-10-CM

## 2017-05-20 DIAGNOSIS — Z1231 Encounter for screening mammogram for malignant neoplasm of breast: Secondary | ICD-10-CM

## 2017-05-20 NOTE — Telephone Encounter (Signed)
Patient called stating that she is having a pap and physical with Anderson [facility] on 06/16/2017 at 8:30am, Patient would like to know if she could have blood work done that morning. Please place lab order in.

## 2017-05-21 NOTE — Telephone Encounter (Signed)
Will order labs

## 2017-06-15 ENCOUNTER — Ambulatory Visit
Admission: RE | Admit: 2017-06-15 | Discharge: 2017-06-15 | Disposition: A | Discharge status: Home / Self Care | Payer: 59 | Source: Ambulatory Visit | Attending: Adult Health | Admitting: Adult Health

## 2017-06-15 ENCOUNTER — Encounter (INDEPENDENT_AMBULATORY_CARE_PROVIDER_SITE_OTHER): Payer: Self-pay

## 2017-06-15 DIAGNOSIS — Z1231 Encounter for screening mammogram for malignant neoplasm of breast: Secondary | ICD-10-CM

## 2017-06-16 ENCOUNTER — Encounter: Payer: Self-pay | Admitting: Adult Health

## 2017-06-16 ENCOUNTER — Ambulatory Visit (INDEPENDENT_AMBULATORY_CARE_PROVIDER_SITE_OTHER): Payer: 59 | Admitting: Adult Health

## 2017-06-16 VITALS — BP 134/86 | HR 81 | Ht 64.5 in | Wt 214.0 lb

## 2017-06-16 DIAGNOSIS — Z01419 Encounter for gynecological examination (general) (routine) without abnormal findings: Secondary | ICD-10-CM | POA: Diagnosis not present

## 2017-06-16 DIAGNOSIS — Z1212 Encounter for screening for malignant neoplasm of rectum: Secondary | ICD-10-CM

## 2017-06-16 DIAGNOSIS — Z1211 Encounter for screening for malignant neoplasm of colon: Secondary | ICD-10-CM | POA: Diagnosis not present

## 2017-06-16 LAB — HEMOCCULT GUIAC POC 1CARD (OFFICE): FECAL OCCULT BLD: NEGATIVE

## 2017-06-16 NOTE — Progress Notes (Signed)
Patient ID: Sarah Hicks, female   DOB: 12-02-1960, 56 y.o.   MRN: [id number] History of Present Illness: Amarissa is a 56 year old white female, married in for a well woman gyn exam, had normal pap with negative HPV 03/24/16. She had labs this morning.  PCP is Dr Woody Seller in [place].    Current Medications, Allergies, Past Medical History, Past Surgical History, Family History and Social History were reviewed in [employer] record.     Review of Systems: Patient denies any headaches, hearing loss, fatigue, blurred vision, shortness of breath, chest pain, abdominal pain, problems with bowel movements, urination, or intercourse. No mood swings.Awaiting right knee replacement     Physical Exam:BP 134/86 (BP Location: Left Arm, Patient Position: Sitting, Cuff Size: Large)   Pulse 81   Ht 5' 4.5" (1.638 m)   Wt 214 lb (97.1 kg)   LMP 04/13/2013   BMI 36.17 kg/m  General:  Well developed, well nourished, no acute distress Skin:  Warm and dry Neck:  Midline trachea, normal thyroid, good ROM, no lymphadenopathy Lungs; Clear to auscultation bilaterally Breast:  No dominant palpable mass, retraction, or nipple discharge Cardiovascular: Regular rate and rhythm Abdomen:  Soft, non tender, no hepatosplenomegaly Pelvic:  External genitalia is normal in appearance, no lesions.  The vagina is normal in appearance. Urethra has no lesions or masses. The cervix is smooth.  Uterus is felt to be normal size, shape, and contour.  No adnexal masses or tenderness noted.Bladder is non tender, no masses felt. Rectal: Good sphincter tone, no polyps, or hemorrhoids felt.  Hemoccult negative. Extremities/musculoskeletal:  No swelling or varicosities noted, no clubbing or cyanosis Psych:  No mood changes, alert and cooperative,seems happy PHQ 2 score 0.  Impression: 1. Well woman exam with routine gynecological exam   2. Screening for colorectal cancer       Plan: Physical in 1 year Pap  in 2020 Mammogram yearly

## 2017-06-17 ENCOUNTER — Telehealth: Payer: Self-pay | Admitting: Adult Health

## 2017-06-17 LAB — COMPREHENSIVE METABOLIC PANEL
ALBUMIN: 4.1 g/dL (ref 3.5–5.5)
ALT: 31 IU/L (ref 0–32)
AST: 25 IU/L (ref 0–40)
Albumin/Globulin Ratio: 1.6 (ref 1.2–2.2)
Alkaline Phosphatase: 95 IU/L (ref 39–117)
BUN/Creatinine Ratio: 12 (ref 9–23)
BUN: 10 mg/dL (ref 6–24)
Bilirubin Total: 0.4 mg/dL (ref 0.0–1.2)
CO2: 24 mmol/L (ref 20–29)
Calcium: 9.4 mg/dL (ref 8.7–10.2)
Chloride: 104 mmol/L (ref 96–106)
Creatinine, Ser: 0.82 mg/dL (ref 0.57–1.00)
GFR, EST AFRICAN AMERICAN: 93 mL/min/{1.73_m2} (ref >59)
GFR, EST NON AFRICAN AMERICAN: 81 mL/min/{1.73_m2} (ref >59)
Globulin, Total: 2.6 g/dL (ref 1.5–4.5)
Glucose: 91 mg/dL (ref 65–99)
Potassium: 5 mmol/L (ref 3.5–5.2)
Sodium: 143 mmol/L (ref 134–144)
TOTAL PROTEIN: 6.7 g/dL (ref 6.0–8.5)

## 2017-06-17 LAB — HEMOGLOBIN A1C
Est. average glucose Bld gHb Est-mCnc: 111 mg/dL
HEMOGLOBIN A1C: 5.5 % (ref 4.8–5.6)

## 2017-06-17 LAB — CBC
HEMOGLOBIN: 13.7 g/dL (ref 11.1–15.9)
Hematocrit: 42 % (ref 34.0–46.6)
MCH: 28.7 pg (ref 26.6–33.0)
MCHC: 32.6 g/dL (ref 31.5–35.7)
MCV: 88 fL (ref 79–97)
Platelets: 318 10*3/uL (ref 150–379)
RBC: 4.77 x10E6/uL (ref 3.77–5.28)
RDW: 14.1 % (ref 12.3–15.4)
WBC: 7.6 10*3/uL (ref 3.4–10.8)

## 2017-06-17 LAB — LIPID PANEL
Chol/HDL Ratio: 3.7 ratio (ref 0.0–4.4)
Cholesterol, Total: 214 mg/dL — ABNORMAL HIGH (ref 100–199)
HDL: 58 mg/dL (ref >39)
LDL Calculated: 117 mg/dL — ABNORMAL HIGH (ref 0–99)
Triglycerides: 197 mg/dL — ABNORMAL HIGH (ref 0–149)
VLDL Cholesterol Cal: 39 mg/dL (ref 5–40)

## 2017-06-17 LAB — VITAMIN D 25 HYDROXY (VIT D DEFICIENCY, FRACTURES): Vit D, 25-Hydroxy: 32.8 ng/mL (ref 30.0–100.0)

## 2017-06-17 LAB — TSH: TSH: 4.31 u[IU]/mL (ref 0.450–4.500)

## 2017-06-17 NOTE — Telephone Encounter (Signed)
Pt aware of labs, keep taking vitamin D and omega 3 and CO Q 10

## 2017-09-19 ENCOUNTER — Ambulatory Visit: Payer: Self-pay | Admitting: Orthopedic Surgery

## 2017-09-28 ENCOUNTER — Ambulatory Visit: Payer: Self-pay | Admitting: Orthopedic Surgery

## 2017-09-28 NOTE — H&P (Signed)
Sarah Hicks DOB: [birth year] Married / Language: English / Race: White Female Date of Admission:  10/25/2017 CC:  Right knee pain History of Present Illness The patient is a 56 year old female who comes in for a preoperative History and Physical. The patient is scheduled for a right total knee arthroplasty to be performed by Dr. Dione Plover. Aluisio, MD at [facility] on 10-25-2017. The patient is a 56 year old female who presented for follow up of their knee. The patient is being followed for their right knee pain and osteoarthritis. They are now month(s) out from when symptoms began. Symptoms reported include: pain, swelling, aching, stiffness, pain with weightbearing and difficulty ambulating. The patient feels that they are doing poorly and report their pain level to be mild. Current treatment includes: relative rest and activity modification. The following medication has been used for pain control: antiinflammatory medication (Ibuprofen). The patient has not gotten any relief of their symptoms with activity modification, Cortisone injections or viscosupplementation (helped some but not to the extent that she was expecting). Patient was seen in referral from [place]. She said her right knee is hurting at all times. She has had a progressive deformity in the knee. The knee hurts all throughout the knee and not just specifically on the medial side. She does get swelling with activity and generally gets better with rest. She feels as though it is definitely limiting what she can and cannot do. She is out of work at this time. It developed after she had a foot injury. She feels as though the knee is essentially taken over her life at this point. She has had two cortisone injections and viscous supplement injections without benefit. Dr. Delilah Shan was taking care of her up until this point. She has reached a point where she is ready to get the knee fixed. They have been treated conservatively in  the past for the above stated problem and despite conservative measures, they continue to have progressive pain and severe functional limitations and dysfunction. They have failed non-operative management including home exercise, medications, and injections. It is felt that they would benefit from undergoing total joint replacement. Risks and benefits of the procedure have been discussed with the patient and they elect to proceed with surgery. There are no active contraindications to surgery such as ongoing infection or rapidly progressive neurological disease.  Problem List/Past Medical Achilles tendinitis (M76.62)  Primary osteoarthritis of right knee (M17.11)  Chronic pain of right knee (M25.561)  Plantar fasciitis, left (M72.2)  Contusion of left foot, subsequent encounter (S90.32XD)  Tinnitus  Bronchitis  Pneumonia  Sleep Apnea  Hiatal Hernia  Hemorrhoids  Urinary Tract Infection  Kidney Stone  Menopause  Scoliosis    Allergies Latex  Family History Cerebrovascular Accident  grandmother fathers side Rheumatoid Arthritis  father Hypertension  mother, father, sister and brother Kidney disease  father Cancer  father Osteoporosis  grandfather mothers side Congestive Heart Failure  grandmother mothers side  Social History Alcohol use  never consumed alcohol Tobacco / smoke exposure  no Pain Contract  no Living situation  live with spouse Exercise  Exercises rarely; does running / walking Drug/Alcohol Rehab (Currently)  no Number of flights of stairs before winded  2-3 Illicit drug use  no Copy of Drug/Alcohol Rehab (Previously)  no Tobacco use  Never smoker. Marital status  married Children  2 Latex  Current work status  working full time  Medication History Tylenol (500MG  Capsule, Oral) Active. Multivitamin Adult (Oral)  Active. CoQ10 (Oral) Specific strength unknown - Active. Probiotic (Oral) Specific strength unknown -  Active. Vitamin C (Oral) Specific strength unknown - Active. Vitamin B Complex (Oral) Active. Ibuprofen Active.  Past Surgical History Tubal Ligation  Date: 39. Ankle Surgery  Date: 2005. right Gallbladder Surgery  Date: 1998. laporoscopic   Review of Systems General Not Present- Chills, Fatigue, Fever, Memory Loss, Night Sweats, Weight Gain and Weight Loss. Skin Not Present- Eczema, Hives, Itching, Lesions and Rash. HEENT Present- Headache and Tinnitus. Not Present- Dentures, Double Vision, Hearing Loss and Visual Loss. Respiratory Present- Allergies. Not Present- Chronic Cough, Coughing up blood, Shortness of breath at rest and Shortness of breath with exertion. Cardiovascular Not Present- Chest Pain, Difficulty Breathing Lying Down, Murmur, Palpitations, Racing/skipping heartbeats and Swelling. Gastrointestinal Not Present- Abdominal Pain, Bloody Stool, Constipation, Diarrhea, Difficulty Swallowing, Heartburn, Jaundice, Loss of appetitie, Nausea and Vomiting. Female Genitourinary Not Present- Blood in Urine, Discharge, Flank Pain, Incontinence, Painful Urination, Urgency, Urinary frequency, Urinary Retention, Urinating at Night and Weak urinary stream. Musculoskeletal Present- Joint Pain, Joint Swelling and Morning Stiffness. Not Present- Back Pain, Muscle Pain, Muscle Weakness and Spasms. Neurological Not Present- Blackout spells, Difficulty with balance, Dizziness, Paralysis, Tremor and Weakness. Psychiatric Not Present- Insomnia.  Vitals Weight: 212 lb Height: 64.5in Weight was reported by patient. Height was reported by patient. Body Surface Area: 2.02 m Body Mass Index: 35.83 kg/m  Pulse: 84 (Regular)  BP: 142/82 (Sitting, Right Arm, Standard)   Physical Exam  General Mental Status -Alert, cooperative and good historian. General Appearance-pleasant, Not in acute distress. Orientation-Oriented X3. Build & Nutrition-Well nourished and Well  developed.  Head and Neck Head-normocephalic, atraumatic . Neck Global Assessment - supple, no bruit auscultated on the right, no bruit auscultated on the left.  Eye Pupil - Bilateral-Regular and Round. Motion - Bilateral-EOMI.  Chest and Lung Exam Auscultation Breath sounds - clear at anterior chest wall and clear at posterior chest wall. Adventitious sounds - No Adventitious sounds.  Cardiovascular Auscultation Rhythm - Regular rate and rhythm. Heart Sounds - S1 WNL and S2 WNL. Murmurs & Other Heart Sounds - Auscultation of the heart reveals - No Murmurs.  Abdomen Palpation/Percussion Tenderness - Abdomen is non-tender to palpation. Rigidity (guarding) - Abdomen is soft. Auscultation Auscultation of the abdomen reveals - Bowel sounds normal.  Female Genitourinary Note: Not done, not pertinent to present illness   Musculoskeletal Note: Evaluation of her hips show normal range of motion, no discomfort. Her left knee shows no effusion. Range of motion of the left knee is about 0 to 125 with no tenderness or instability. Her right knee shows a trace effusion. Her range is about 5 to 120. There is moderate crepitus on range of motion. She is tender medial greater than lateral. There is no instability. She does have a varus deformity. Pulse, sensation, motor intact distally. She has significantly antalgic gait pattern with a varus thrust.  She had an MRI scan which showed a degenerative meniscal tear as well as significant degeneration medial and patellofemoral. She did not have any plain film, so we obtained plain films today. Standing AP and lateral of the right knee and she has got bone-on-bone arthritis in the medial and patellofemoral compartments of the right knee with varus deformity.   Assessment & Plan Primary osteoarthritis of right knee (M17.11)  Note:Surgical Plans: Right Total Knee Replacement  Disposition: Home, Straight to outpatient therapy in [facility] on  Friday Jan 18th.  PCP: Dr. Woody Seller, [facility] - pending  IV TXA  Anesthesia Issues: None  Patient was instructed on what medications to stop prior to surgery.  Signed electronically by Joelene Millin, III PA-C

## 2017-10-15 ENCOUNTER — Other Ambulatory Visit (HOSPITAL_COMMUNITY): Payer: Self-pay | Admitting: *Deleted

## 2017-10-15 NOTE — Patient Instructions (Signed)
Sarah Hicks  10/15/2017   Your procedure is scheduled on: 10-25-17  Report to [facility] Main  Entrance Follow signs to Short Stay on first floor at 530 AM  Call this number if you have problems the morning of surgery [phone]   Remember: Do not eat food or drink liquids :After Midnight.     Take these medicines the morning of surgery with A SIP OF WATER: LORATADINE (CLARITIN ) IF NEEDED, EYE DROP IF NEEDED                               You may not have any metal on your body including hair pins and              piercings  Do not wear jewelry, make-up, lotions, powders or perfumes, deodorant             Do not wear nail polish.  Do not shave  48 hours prior to surgery.              Men may shave face and neck.   Do not bring valuables to the hospital. [place].  Contacts, dentures or bridgework may not be worn into surgery.  Leave suitcase in the car. After surgery it may be brought to your room.                  Please read over the following fact sheets you were given: _____________________________________________________________________             [facility] - Preparing for Surgery Before surgery, you can play an important role.  Because skin is not sterile, your skin needs to be as free of germs as possible.  You can reduce the number of germs on your skin by washing with CHG (chlorahexidine gluconate) soap before surgery.  CHG is an antiseptic cleaner which kills germs and bonds with the skin to continue killing germs even after washing. Please DO NOT use if you have an allergy to CHG or antibacterial soaps.  If your skin becomes reddened/irritated stop using the CHG and inform your nurse when you arrive at Short Stay. Do not shave (including legs and underarms) for at least 48 hours prior to the first CHG shower.  You may shave your face/neck. Please follow these instructions carefully:  1.   Shower with CHG Soap the night before surgery and the  morning of Surgery.  2.  If you choose to wash your hair, wash your hair first as usual with your  normal  shampoo.  3.  After you shampoo, rinse your hair and body thoroughly to remove the  shampoo.                           4.  Use CHG as you would any other liquid soap.  You can apply chg directly  to the skin and wash                       Gently with a scrungie or clean washcloth.  5.  Apply the CHG Soap to your body ONLY FROM THE NECK DOWN.   Do not use on face/ open  Wound or open sores. Avoid contact with eyes, ears mouth and genitals (private parts).                       Wash face,  Genitals (private parts) with your normal soap.             6.  Wash thoroughly, paying special attention to the area where your surgery  will be performed.  7.  Thoroughly rinse your body with warm water from the neck down.  8.  DO NOT shower/wash with your normal soap after using and rinsing off  the CHG Soap.                9.  Pat yourself dry with a clean towel.            10.  Wear clean pajamas.            11.  Place clean sheets on your bed the night of your first shower and do not  sleep with pets. Day of Surgery : Do not apply any lotions/deodorants the morning of surgery.  Please wear clean clothes to the hospital/surgery center.  FAILURE TO FOLLOW THESE INSTRUCTIONS MAY RESULT IN THE CANCELLATION OF YOUR SURGERY PATIENT SIGNATURE_________________________________  NURSE SIGNATURE__________________________________  ________________________________________________________________________   Adam Phenix  An incentive spirometer is a tool that can help keep your lungs clear and active. This tool measures how well you are filling your lungs with each breath. Taking long deep breaths may help reverse or decrease the chance of developing breathing (pulmonary) problems (especially infection) following:  A long  period of time when you are unable to move or be active. BEFORE THE PROCEDURE   If the spirometer includes an indicator to show your best effort, your nurse or respiratory therapist will set it to a desired goal.  If possible, sit up straight or lean slightly forward. Try not to slouch.  Hold the incentive spirometer in an upright position. INSTRUCTIONS FOR USE  1. Sit on the edge of your bed if possible, or sit up as far as you can in bed or on a chair. 2. Hold the incentive spirometer in an upright position. 3. Breathe out normally. 4. Place the mouthpiece in your mouth and seal your lips tightly around it. 5. Breathe in slowly and as deeply as possible, raising the piston or the ball toward the top of the column. 6. Hold your breath for 3-5 seconds or for as long as possible. Allow the piston or ball to fall to the bottom of the column. 7. Remove the mouthpiece from your mouth and breathe out normally. 8. Rest for a few seconds and repeat Steps 1 through 7 at least 10 times every 1-2 hours when you are awake. Take your time and take a few normal breaths between deep breaths. 9. The spirometer may include an indicator to show your best effort. Use the indicator as a goal to work toward during each repetition. 10. After each set of 10 deep breaths, practice coughing to be sure your lungs are clear. If you have an incision (the cut made at the time of surgery), support your incision when coughing by placing a pillow or rolled up towels firmly against it. Once you are able to get out of bed, walk around indoors and cough well. You may stop using the incentive spirometer when instructed by your caregiver.  RISKS AND COMPLICATIONS  Take your time so you do not get  dizzy or light-headed.  If you are in pain, you may need to take or ask for pain medication before doing incentive spirometry. It is harder to take a deep breath if you are having pain. AFTER USE  Rest and breathe slowly and  easily.  It can be helpful to keep track of a log of your progress. Your caregiver can provide you with a simple table to help with this. If you are using the spirometer at home, follow these instructions: [place] IF:   You are having difficultly using the spirometer.  You have trouble using the spirometer as often as instructed.  Your pain medication is not giving enough relief while using the spirometer.  You develop fever of 100.5 F (38.1 C) or higher. SEEK IMMEDIATE MEDICAL CARE IF:   You cough up bloody sputum that had not been present before.  You develop fever of 102 F (38.9 C) or greater.  You develop worsening pain at or near the incision site. MAKE SURE YOU:   Understand these instructions.  Will watch your condition.  Will get help right away if you are not doing well or get worse. Document Released: 02/08/2007 Document Revised: 12/21/2011 Document Reviewed: 04/11/2007 ExitCare Patient Information 2014 ExitCare, [place].   ________________________________________________________________________  WHAT IS A BLOOD TRANSFUSION? Blood Transfusion Information  A transfusion is the replacement of blood or some of its parts. Blood is made up of multiple cells which provide different functions.  Red blood cells carry oxygen and are used for blood loss replacement.  White blood cells fight against infection.  Platelets control bleeding.  Plasma helps clot blood.  Other blood products are available for specialized needs, such as hemophilia or other clotting disorders. BEFORE THE TRANSFUSION  Who gives blood for transfusions?   Healthy volunteers who are fully evaluated to make sure their blood is safe. This is blood bank blood. Transfusion therapy is the safest it has ever been in the practice of medicine. Before blood is taken from a donor, a complete history is taken to make sure that person has no history of diseases nor engages in risky social  behavior (examples are intravenous drug use or sexual activity with multiple partners). The donor's travel history is screened to minimize risk of transmitting infections, such as malaria. The donated blood is tested for signs of infectious diseases, such as HIV and hepatitis. The blood is then tested to be sure it is compatible with you in order to minimize the chance of a transfusion reaction. If you or a relative donates blood, this is often done in anticipation of surgery and is not appropriate for emergency situations. It takes many days to process the donated blood. RISKS AND COMPLICATIONS Although transfusion therapy is very safe and saves many lives, the main dangers of transfusion include:   Getting an infectious disease.  Developing a transfusion reaction. This is an allergic reaction to something in the blood you were given. Every precaution is taken to prevent this. The decision to have a blood transfusion has been considered carefully by your caregiver before blood is given. Blood is not given unless the benefits outweigh the risks. AFTER THE TRANSFUSION  Right after receiving a blood transfusion, you will usually feel much better and more energetic. This is especially true if your red blood cells have gotten low (anemic). The transfusion raises the level of the red blood cells which carry oxygen, and this usually causes an energy increase.  The nurse administering the transfusion will  monitor you carefully for complications. HOME CARE INSTRUCTIONS  No special instructions are needed after a transfusion. You may find your energy is better. Speak with your caregiver about any limitations on activity for underlying diseases you may have. SEEK MEDICAL CARE IF:   Your condition is not improving after your transfusion.  You develop redness or irritation at the intravenous (IV) site. SEEK IMMEDIATE MEDICAL CARE IF:  Any of the following symptoms occur over the next 12 hours:  Shaking  chills.  You have a temperature by mouth above 102 F (38.9 C), not controlled by medicine.  Chest, back, or muscle pain.  People around you feel you are not acting correctly or are confused.  Shortness of breath or difficulty breathing.  Dizziness and fainting.  You get a rash or develop hives.  You have a decrease in urine output.  Your urine turns a dark color or changes to pink, red, or brown. Any of the following symptoms occur over the next 10 days:  You have a temperature by mouth above 102 F (38.9 C), not controlled by medicine.  Shortness of breath.  Weakness after normal activity.  The white part of the eye turns yellow (jaundice).  You have a decrease in the amount of urine or are urinating less often.  Your urine turns a dark color or changes to pink, red, or brown. Document Released: 09/25/2000 Document Revised: 12/21/2011 Document Reviewed: 05/14/2008 [facility] Patient Information 2014 [place], [place].  _______________________________________________________________________

## 2017-10-15 NOTE — Progress Notes (Signed)
MEDICAL CLEARANCE DR Woody Seller 09-29-17 ON CHART

## 2017-10-19 ENCOUNTER — Ambulatory Visit (HOSPITAL_COMMUNITY)
Admission: RE | Admit: 2017-10-19 | Discharge: 2017-10-19 | Disposition: A | Discharge status: Home / Self Care | Payer: 59 | Source: Ambulatory Visit | Attending: Anesthesiology | Admitting: Anesthesiology

## 2017-10-19 ENCOUNTER — Encounter (HOSPITAL_COMMUNITY): Payer: Self-pay

## 2017-10-19 ENCOUNTER — Encounter (HOSPITAL_COMMUNITY)
Admission: RE | Admit: 2017-10-19 | Discharge: 2017-10-19 | Disposition: A | Discharge status: Home / Self Care | Payer: 59 | Source: Ambulatory Visit | Attending: Orthopedic Surgery | Admitting: Orthopedic Surgery

## 2017-10-19 ENCOUNTER — Encounter (INDEPENDENT_AMBULATORY_CARE_PROVIDER_SITE_OTHER): Payer: Self-pay

## 2017-10-19 ENCOUNTER — Other Ambulatory Visit: Payer: Self-pay

## 2017-10-19 DIAGNOSIS — M1711 Unilateral primary osteoarthritis, right knee: Secondary | ICD-10-CM | POA: Insufficient documentation

## 2017-10-19 DIAGNOSIS — Z01818 Encounter for other preprocedural examination: Secondary | ICD-10-CM | POA: Insufficient documentation

## 2017-10-19 DIAGNOSIS — R059 Cough, unspecified: Secondary | ICD-10-CM

## 2017-10-19 DIAGNOSIS — R05 Cough: Secondary | ICD-10-CM

## 2017-10-19 HISTORY — DX: Adverse effect of unspecified anesthetic, initial encounter: T41.45XA

## 2017-10-19 HISTORY — DX: Other complications of anesthesia, initial encounter: T88.59XA

## 2017-10-19 HISTORY — DX: Gastro-esophageal reflux disease without esophagitis: K21.9

## 2017-10-19 HISTORY — DX: Unspecified osteoarthritis, unspecified site: M19.90

## 2017-10-19 HISTORY — DX: Scoliosis, unspecified: M41.9

## 2017-10-19 HISTORY — DX: Essential (primary) hypertension: I10

## 2017-10-19 HISTORY — DX: Personal history of urinary calculi: Z87.442

## 2017-10-19 HISTORY — DX: Headache, unspecified: R51.9

## 2017-10-19 HISTORY — DX: Headache: R51

## 2017-10-19 LAB — SURGICAL PCR SCREEN
MRSA, PCR: NEGATIVE
STAPHYLOCOCCUS AUREUS: NEGATIVE

## 2017-10-19 LAB — COMPREHENSIVE METABOLIC PANEL
ALK PHOS: 99 U/L (ref 38–126)
ALT: 49 U/L (ref 14–54)
AST: 33 U/L (ref 15–41)
Albumin: 4.1 g/dL (ref 3.5–5.0)
Anion gap: 5 (ref 5–15)
BUN: 13 mg/dL (ref 6–20)
CALCIUM: 9 mg/dL (ref 8.9–10.3)
CO2: 29 mmol/L (ref 22–32)
Chloride: 107 mmol/L (ref 101–111)
Creatinine, Ser: 0.84 mg/dL (ref 0.44–1.00)
Glucose, Bld: 103 mg/dL — ABNORMAL HIGH (ref 65–99)
Potassium: 4.5 mmol/L (ref 3.5–5.1)
SODIUM: 141 mmol/L (ref 135–145)
Total Bilirubin: 0.6 mg/dL (ref 0.3–1.2)
Total Protein: 7.6 g/dL (ref 6.5–8.1)

## 2017-10-19 LAB — APTT: aPTT: 26 seconds (ref 24–36)

## 2017-10-19 LAB — CBC
HCT: 43.4 % (ref 36.0–46.0)
HEMOGLOBIN: 14 g/dL (ref 12.0–15.0)
MCH: 29.2 pg (ref 26.0–34.0)
MCHC: 32.3 g/dL (ref 30.0–36.0)
MCV: 90.6 fL (ref 78.0–100.0)
Platelets: 257 10*3/uL (ref 150–400)
RBC: 4.79 MIL/uL (ref 3.87–5.11)
RDW: 13.6 % (ref 11.5–15.5)
WBC: 8.5 10*3/uL (ref 4.0–10.5)

## 2017-10-19 LAB — PROTIME-INR
INR: 0.94
PROTHROMBIN TIME: 12.5 s (ref 11.4–15.2)

## 2017-10-19 NOTE — Progress Notes (Signed)
Spoke with dr houser anesthesia, made aware patient with recent uri, treated with amoxicillian and prednisone 2 weeks ago patient with cough anf white phlegm, patient coughs sitting up and cough worse lying down, chest xray ordered per dr houser.

## 2017-10-20 LAB — ABO/RH: ABO/RH(D): A POS

## 2017-10-24 MED ORDER — TRANEXAMIC ACID 1000 MG/10ML IV SOLN
1000.0000 mg | INTRAVENOUS | Status: AC
  Administered 2017-10-25: 1000 mg via INTRAVENOUS
  Filled 2017-10-24: qty 1100

## 2017-10-24 MED ORDER — BUPIVACAINE LIPOSOME 1.3 % IJ SUSP
20.0000 mL | Freq: Once | INTRAMUSCULAR | Status: DC
  Filled 2017-10-24: qty 20

## 2017-10-24 NOTE — H&P (Signed)
Sarah Hicks DOB: 01/01/1961 Married / Language: English / Race: White Female Date of Admission:  10/25/2017 CC:  Right knee pain History of Present Illness The patient is a 57 year old female who comes in for a preoperative History and Physical. The patient is scheduled for a right total knee arthroplasty to be performed by Dr. Dione Plover. Aluisio, MD at [facility] on 10-25-2017. The patient is a 57 year old female who presented for follow up of their knee. The patient is being followed for their right knee pain and osteoarthritis. They are now month(s) out from when symptoms began. Symptoms reported include: pain, swelling, aching, stiffness, pain with weightbearing and difficulty ambulating. The patient feels that they are doing poorly and report their pain level to be mild. Current treatment includes: relative rest and activity modification. The following medication has been used for pain control: antiinflammatory medication (Ibuprofen). The patient has not gotten any relief of their symptoms with activity modification, Cortisone injections or viscosupplementation (helped some but not to the extent that she was expecting). Patient was seen in referral from [place]. She said her right knee is hurting at all times. She has had a progressive deformity in the knee. The knee hurts all throughout the knee and not just specifically on the medial side. She does get swelling with activity and generally gets better with rest. She feels as though it is definitely limiting what she can and cannot do. She is out of work at this time. It developed after she had a foot injury. She feels as though the knee is essentially taken over her life at this point. She has had two cortisone injections and viscous supplement injections without benefit. Dr. Delilah Shan was taking care of her up until this point. She has reached a point where she is ready to get the knee fixed. They have been treated conservatively in  the past for the above stated problem and despite conservative measures, they continue to have progressive pain and severe functional limitations and dysfunction. They have failed non-operative management including home exercise, medications, and injections. It is felt that they would benefit from undergoing total joint replacement. Risks and benefits of the procedure have been discussed with the patient and they elect to proceed with surgery. There are no active contraindications to surgery such as ongoing infection or rapidly progressive neurological disease.  Problem List/Past Medical Achilles tendinitis (M76.62)  Primary osteoarthritis of right knee (M17.11)  Chronic pain of right knee (M25.561)  Plantar fasciitis, left (M72.2)  Contusion of left foot, subsequent encounter (S90.32XD)  Tinnitus  Bronchitis  Pneumonia  Sleep Apnea  Hiatal Hernia  Hemorrhoids  Urinary Tract Infection  Kidney Stone  Menopause  Scoliosis    Allergies Latex  Family History Cerebrovascular Accident  grandmother fathers side Rheumatoid Arthritis  father Hypertension  mother, father, sister and brother Kidney disease  father Cancer  father Osteoporosis  grandfather mothers side Congestive Heart Failure  grandmother mothers side  Social History Alcohol use  never consumed alcohol Tobacco / smoke exposure  no Pain Contract  no Living situation  live with spouse Exercise  Exercises rarely; does running / walking Drug/Alcohol Rehab (Currently)  no Number of flights of stairs before winded  2-3 Illicit drug use  no Copy of Drug/Alcohol Rehab (Previously)  no Tobacco use  Never smoker. Marital status  married Children  2 Latex  Current work status  working full time  Medication History Tylenol (500MG  Capsule, Oral) Active. Multivitamin Adult (Oral)  Active. CoQ10 (Oral) Specific strength unknown - Active. Probiotic (Oral) Specific strength unknown  - Active. Vitamin C (Oral) Specific strength unknown - Active. Vitamin B Complex (Oral) Active. Ibuprofen Active.  Past Surgical History Tubal Ligation  Date: 35. Ankle Surgery  Date: 2005. right Gallbladder Surgery  Date: 1998. laporoscopic   Review of Systems General Not Present- Chills, Fatigue, Fever, Memory Loss, Night Sweats, Weight Gain and Weight Loss. Skin Not Present- Eczema, Hives, Itching, Lesions and Rash. HEENT Present- Headache and Tinnitus. Not Present- Dentures, Double Vision, Hearing Loss and Visual Loss. Respiratory Present- Allergies. Not Present- Chronic Cough, Coughing up blood, Shortness of breath at rest and Shortness of breath with exertion. Cardiovascular Not Present- Chest Pain, Difficulty Breathing Lying Down, Murmur, Palpitations, Racing/skipping heartbeats and Swelling. Gastrointestinal Not Present- Abdominal Pain, Bloody Stool, Constipation, Diarrhea, Difficulty Swallowing, Heartburn, Jaundice, Loss of appetitie, Nausea and Vomiting. Female Genitourinary Not Present- Blood in Urine, Discharge, Flank Pain, Incontinence, Painful Urination, Urgency, Urinary frequency, Urinary Retention, Urinating at Night and Weak urinary stream. Musculoskeletal Present- Joint Pain, Joint Swelling and Morning Stiffness. Not Present- Back Pain, Muscle Pain, Muscle Weakness and Spasms. Neurological Not Present- Blackout spells, Difficulty with balance, Dizziness, Paralysis, Tremor and Weakness. Psychiatric Not Present- Insomnia.  Vitals Weight: 212 lb Height: 64.5in Weight was reported by patient. Height was reported by patient. Body Surface Area: 2.02 m Body Mass Index: 35.83 kg/m  Pulse: 84 (Regular)  BP: 142/82 (Sitting, Right Arm, Standard)   Physical Exam  General Mental Status -Alert, cooperative and good historian. General Appearance-pleasant, Not in acute distress. Orientation-Oriented X3. Build & Nutrition-Well nourished and Well  developed.  Head and Neck Head-normocephalic, atraumatic . Neck Global Assessment - supple, no bruit auscultated on the right, no bruit auscultated on the left.  Eye Pupil - Bilateral-Regular and Round. Motion - Bilateral-EOMI.  Chest and Lung Exam Auscultation Breath sounds - clear at anterior chest wall and clear at posterior chest wall. Adventitious sounds - No Adventitious sounds.  Cardiovascular Auscultation Rhythm - Regular rate and rhythm. Heart Sounds - S1 WNL and S2 WNL. Murmurs & Other Heart Sounds - Auscultation of the heart reveals - No Murmurs.  Abdomen Palpation/Percussion Tenderness - Abdomen is non-tender to palpation. Rigidity (guarding) - Abdomen is soft. Auscultation Auscultation of the abdomen reveals - Bowel sounds normal.  Female Genitourinary Note: Not done, not pertinent to present illness   Musculoskeletal Note: Evaluation of her hips show normal range of motion, no discomfort. Her left knee shows no effusion. Range of motion of the left knee is about 0 to 125 with no tenderness or instability. Her right knee shows a trace effusion. Her range is about 5 to 120. There is moderate crepitus on range of motion. She is tender medial greater than lateral. There is no instability. She does have a varus deformity. Pulse, sensation, motor intact distally. She has significantly antalgic gait pattern with a varus thrust.  She had an MRI scan which showed a degenerative meniscal tear as well as significant degeneration medial and patellofemoral. She did not have any plain film, so we obtained plain films today. Standing AP and lateral of the right knee and she has got bone-on-bone arthritis in the medial and patellofemoral compartments of the right knee with varus deformity.   Assessment & Plan Primary osteoarthritis of right knee (M17.11)  Note:Surgical Plans: Right Total Knee Replacement  Disposition: Home, Straight to outpatient therapy  in [facility] on Friday Jan 18th.  PCP: Dr. Woody Seller, [facility] - pending  IV TXA  Anesthesia Issues: None  Patient was instructed on what medications to stop prior to surgery.  Signed electronically by Joelene Millin, III PA-C

## 2017-10-25 ENCOUNTER — Encounter (HOSPITAL_COMMUNITY): Payer: Self-pay

## 2017-10-25 ENCOUNTER — Inpatient Hospital Stay (HOSPITAL_COMMUNITY): Payer: 59 | Admitting: Anesthesiology

## 2017-10-25 ENCOUNTER — Inpatient Hospital Stay (HOSPITAL_COMMUNITY)
Admission: RE | Admit: 2017-10-25 | Discharge: 2017-10-27 | DRG: 470 | Disposition: A | Discharge status: Home / Self Care | Payer: 59 | Source: Ambulatory Visit | Attending: Orthopedic Surgery | Admitting: Orthopedic Surgery

## 2017-10-25 ENCOUNTER — Other Ambulatory Visit: Payer: Self-pay

## 2017-10-25 ENCOUNTER — Encounter (HOSPITAL_COMMUNITY)
Admission: RE | Disposition: A | Discharge status: Home / Self Care | Payer: Self-pay | Source: Ambulatory Visit | Attending: Orthopedic Surgery

## 2017-10-25 DIAGNOSIS — G8929 Other chronic pain: Secondary | ICD-10-CM | POA: Diagnosis present

## 2017-10-25 DIAGNOSIS — M179 Osteoarthritis of knee, unspecified: Secondary | ICD-10-CM | POA: Diagnosis present

## 2017-10-25 DIAGNOSIS — M25561 Pain in right knee: Secondary | ICD-10-CM | POA: Diagnosis present

## 2017-10-25 DIAGNOSIS — M7660 Achilles tendinitis, unspecified leg: Secondary | ICD-10-CM | POA: Diagnosis present

## 2017-10-25 DIAGNOSIS — J40 Bronchitis, not specified as acute or chronic: Secondary | ICD-10-CM | POA: Diagnosis present

## 2017-10-25 DIAGNOSIS — G4730 Sleep apnea, unspecified: Secondary | ICD-10-CM | POA: Diagnosis present

## 2017-10-25 DIAGNOSIS — Z87442 Personal history of urinary calculi: Secondary | ICD-10-CM

## 2017-10-25 DIAGNOSIS — M1711 Unilateral primary osteoarthritis, right knee: Principal | ICD-10-CM | POA: Diagnosis present

## 2017-10-25 DIAGNOSIS — M1710 Unilateral primary osteoarthritis, unspecified knee: Secondary | ICD-10-CM | POA: Diagnosis present

## 2017-10-25 DIAGNOSIS — M722 Plantar fascial fibromatosis: Secondary | ICD-10-CM | POA: Diagnosis present

## 2017-10-25 DIAGNOSIS — I10 Essential (primary) hypertension: Secondary | ICD-10-CM | POA: Diagnosis present

## 2017-10-25 DIAGNOSIS — S9032XA Contusion of left foot, initial encounter: Secondary | ICD-10-CM | POA: Diagnosis present

## 2017-10-25 DIAGNOSIS — X58XXXD Exposure to other specified factors, subsequent encounter: Secondary | ICD-10-CM | POA: Diagnosis present

## 2017-10-25 DIAGNOSIS — Z79899 Other long term (current) drug therapy: Secondary | ICD-10-CM

## 2017-10-25 DIAGNOSIS — K219 Gastro-esophageal reflux disease without esophagitis: Secondary | ICD-10-CM | POA: Diagnosis present

## 2017-10-25 HISTORY — PX: TOTAL KNEE ARTHROPLASTY: SHX125

## 2017-10-25 LAB — TYPE AND SCREEN
ABO/RH(D): A POS
Antibody Screen: NEGATIVE

## 2017-10-25 SURGERY — ARTHROPLASTY, KNEE, TOTAL
Anesthesia: General | Site: Knee | Laterality: Right

## 2017-10-25 MED ORDER — HYDROMORPHONE HCL 1 MG/ML IJ SOLN
INTRAMUSCULAR | Status: AC
  Administered 2017-10-25: 0.25 mg via INTRAVENOUS
  Filled 2017-10-25: qty 1

## 2017-10-25 MED ORDER — TRAMADOL HCL 50 MG PO TABS
50.0000 mg | ORAL_TABLET | Freq: Four times a day (QID) | ORAL | Status: DC | PRN
  Administered 2017-10-25 (×2): 50 mg via ORAL
  Filled 2017-10-25 (×2): qty 1

## 2017-10-25 MED ORDER — FENTANYL CITRATE (PF) 100 MCG/2ML IJ SOLN
25.0000 ug | INTRAMUSCULAR | Status: DC | PRN
  Administered 2017-10-25: 50 ug via INTRAVENOUS

## 2017-10-25 MED ORDER — 0.9 % SODIUM CHLORIDE (POUR BTL) OPTIME
TOPICAL | Status: DC | PRN
  Administered 2017-10-25: 1000 mL

## 2017-10-25 MED ORDER — LACTATED RINGERS IV SOLN
INTRAVENOUS | Status: DC
  Administered 2017-10-25: 07:00:00 via INTRAVENOUS

## 2017-10-25 MED ORDER — MENTHOL 3 MG MT LOZG: 1.0000 | LOZENGE | OROMUCOSAL | Status: DC | PRN

## 2017-10-25 MED ORDER — ACETAMINOPHEN 650 MG RE SUPP: 650.0000 mg | RECTAL | Status: DC | PRN

## 2017-10-25 MED ORDER — DEXAMETHASONE SODIUM PHOSPHATE 10 MG/ML IJ SOLN
10.0000 mg | Freq: Once | INTRAMUSCULAR | Status: AC
  Administered 2017-10-26: 10 mg via INTRAVENOUS
  Filled 2017-10-25: qty 1

## 2017-10-25 MED ORDER — CEFAZOLIN SODIUM-DEXTROSE 2-4 GM/100ML-% IV SOLN
2.0000 g | INTRAVENOUS | Status: AC
  Administered 2017-10-25: 2 g via INTRAVENOUS
  Filled 2017-10-25: qty 100

## 2017-10-25 MED ORDER — TRAMADOL HCL 50 MG PO TABS: 50.0000 mg | ORAL_TABLET | Freq: Four times a day (QID) | ORAL | Status: DC | PRN

## 2017-10-25 MED ORDER — HYDROMORPHONE HCL 2 MG PO TABS
4.0000 mg | ORAL_TABLET | ORAL | Status: DC | PRN
  Administered 2017-10-26 – 2017-10-27 (×7): 4 mg via ORAL
  Filled 2017-10-25 (×8): qty 2

## 2017-10-25 MED ORDER — ONDANSETRON HCL 4 MG PO TABS
4.0000 mg | ORAL_TABLET | Freq: Four times a day (QID) | ORAL | Status: DC | PRN
Start: 2017-10-25 — End: 2017-10-27

## 2017-10-25 MED ORDER — ONDANSETRON HCL 4 MG/2ML IJ SOLN
INTRAMUSCULAR | Status: AC
  Filled 2017-10-25: qty 2

## 2017-10-25 MED ORDER — FENTANYL CITRATE (PF) 100 MCG/2ML IJ SOLN
INTRAMUSCULAR | Status: AC
  Filled 2017-10-25: qty 4

## 2017-10-25 MED ORDER — OXYCODONE HCL 5 MG PO TABS: 5.0000 mg | ORAL_TABLET | ORAL | Status: DC | PRN

## 2017-10-25 MED ORDER — LORATADINE 10 MG PO TABS: 10.0000 mg | ORAL_TABLET | Freq: Every day | ORAL | Status: DC | PRN

## 2017-10-25 MED ORDER — METHOCARBAMOL 500 MG PO TABS
500.0000 mg | ORAL_TABLET | Freq: Four times a day (QID) | ORAL | Status: DC | PRN
  Administered 2017-10-25 – 2017-10-27 (×5): 500 mg via ORAL
  Filled 2017-10-25 (×5): qty 1

## 2017-10-25 MED ORDER — SODIUM CHLORIDE 0.9 % IJ SOLN
INTRAMUSCULAR | Status: AC
  Filled 2017-10-25: qty 50

## 2017-10-25 MED ORDER — SODIUM CHLORIDE 0.9 % IR SOLN
Status: DC | PRN
  Administered 2017-10-25: 1000 mL

## 2017-10-25 MED ORDER — FENTANYL CITRATE (PF) 100 MCG/2ML IJ SOLN
INTRAMUSCULAR | Status: AC
  Filled 2017-10-25: qty 2

## 2017-10-25 MED ORDER — HYDROMORPHONE HCL 1 MG/ML IJ SOLN
0.2500 mg | INTRAMUSCULAR | Status: DC | PRN
  Administered 2017-10-25 (×2): 0.25 mg via INTRAVENOUS

## 2017-10-25 MED ORDER — TRANEXAMIC ACID 1000 MG/10ML IV SOLN
1000.0000 mg | Freq: Once | INTRAVENOUS | Status: AC
  Administered 2017-10-25: 1000 mg via INTRAVENOUS
  Filled 2017-10-25: qty 1100

## 2017-10-25 MED ORDER — SODIUM CHLORIDE 0.9 % IV SOLN
INTRAVENOUS | Status: DC
  Administered 2017-10-25: 1000 mL via INTRAVENOUS
  Administered 2017-10-26: 01:00:00 via INTRAVENOUS

## 2017-10-25 MED ORDER — METOCLOPRAMIDE HCL 5 MG/ML IJ SOLN
5.0000 mg | Freq: Once | INTRAMUSCULAR | Status: AC
  Administered 2017-10-25: 5 mg via INTRAVENOUS

## 2017-10-25 MED ORDER — METOCLOPRAMIDE HCL 5 MG PO TABS: 5.0000 mg | ORAL_TABLET | Freq: Three times a day (TID) | ORAL | Status: DC | PRN

## 2017-10-25 MED ORDER — METHOCARBAMOL 1000 MG/10ML IJ SOLN
500.0000 mg | Freq: Four times a day (QID) | INTRAMUSCULAR | Status: DC | PRN
  Administered 2017-10-25: 500 mg via INTRAVENOUS
  Filled 2017-10-25: qty 550

## 2017-10-25 MED ORDER — ROCURONIUM BROMIDE 50 MG/5ML IV SOSY
PREFILLED_SYRINGE | INTRAVENOUS | Status: AC
  Filled 2017-10-25: qty 5

## 2017-10-25 MED ORDER — ONDANSETRON HCL 4 MG/2ML IJ SOLN: 4.0000 mg | Freq: Four times a day (QID) | INTRAMUSCULAR | Status: DC | PRN

## 2017-10-25 MED ORDER — DOCUSATE SODIUM 100 MG PO CAPS
100.0000 mg | ORAL_CAPSULE | Freq: Two times a day (BID) | ORAL | Status: DC
  Administered 2017-10-26 – 2017-10-27 (×4): 100 mg via ORAL
  Filled 2017-10-25 (×4): qty 1

## 2017-10-25 MED ORDER — BRIMONIDINE TARTRATE 0.025 % OP SOLN: 1.0000 [drp] | Freq: Every day | OPHTHALMIC | Status: DC | PRN

## 2017-10-25 MED ORDER — PHENOL 1.4 % MT LIQD: 1.0000 | OROMUCOSAL | Status: DC | PRN

## 2017-10-25 MED ORDER — CEFAZOLIN SODIUM-DEXTROSE 2-4 GM/100ML-% IV SOLN
2.0000 g | Freq: Four times a day (QID) | INTRAVENOUS | Status: AC
  Administered 2017-10-25 (×2): 2 g via INTRAVENOUS
  Filled 2017-10-25 (×3): qty 100

## 2017-10-25 MED ORDER — CHLORHEXIDINE GLUCONATE 4 % EX LIQD: 60.0000 mL | Freq: Once | CUTANEOUS | Status: DC

## 2017-10-25 MED ORDER — LIDOCAINE 2% (20 MG/ML) 5 ML SYRINGE
INTRAMUSCULAR | Status: AC
  Filled 2017-10-25: qty 5

## 2017-10-25 MED ORDER — GABAPENTIN 300 MG PO CAPS
300.0000 mg | ORAL_CAPSULE | Freq: Once | ORAL | Status: AC
  Administered 2017-10-25: 300 mg via ORAL
  Filled 2017-10-25: qty 1

## 2017-10-25 MED ORDER — RIVAROXABAN 10 MG PO TABS
10.0000 mg | ORAL_TABLET | Freq: Every day | ORAL | Status: DC
  Administered 2017-10-26 – 2017-10-27 (×2): 10 mg via ORAL
  Filled 2017-10-25 (×2): qty 1

## 2017-10-25 MED ORDER — MIDAZOLAM HCL 2 MG/2ML IJ SOLN
INTRAMUSCULAR | Status: AC
  Filled 2017-10-25: qty 2

## 2017-10-25 MED ORDER — PROPOFOL 10 MG/ML IV BOLUS
INTRAVENOUS | Status: DC | PRN
  Administered 2017-10-25: 150 mg via INTRAVENOUS

## 2017-10-25 MED ORDER — MIDAZOLAM HCL 5 MG/5ML IJ SOLN
INTRAMUSCULAR | Status: DC | PRN
  Administered 2017-10-25: 2 mg via INTRAVENOUS

## 2017-10-25 MED ORDER — DIPHENHYDRAMINE HCL 12.5 MG/5ML PO ELIX: 12.5000 mg | ORAL_SOLUTION | ORAL | Status: DC | PRN

## 2017-10-25 MED ORDER — DEXAMETHASONE SODIUM PHOSPHATE 10 MG/ML IJ SOLN
10.0000 mg | Freq: Once | INTRAMUSCULAR | Status: AC
  Administered 2017-10-25: 10 mg via INTRAVENOUS

## 2017-10-25 MED ORDER — FENTANYL CITRATE (PF) 100 MCG/2ML IJ SOLN
INTRAMUSCULAR | Status: DC | PRN
  Administered 2017-10-25: 100 ug via INTRAVENOUS
  Administered 2017-10-25 (×5): 50 ug via INTRAVENOUS

## 2017-10-25 MED ORDER — BUPIVACAINE LIPOSOME 1.3 % IJ SUSP
INTRAMUSCULAR | Status: DC | PRN
  Administered 2017-10-25: 20 mL

## 2017-10-25 MED ORDER — SODIUM CHLORIDE 0.9 % IJ SOLN
INTRAMUSCULAR | Status: AC
  Filled 2017-10-25: qty 10

## 2017-10-25 MED ORDER — ONDANSETRON HCL 4 MG/2ML IJ SOLN
INTRAMUSCULAR | Status: DC | PRN
  Administered 2017-10-25: 4 mg via INTRAVENOUS

## 2017-10-25 MED ORDER — SUGAMMADEX SODIUM 200 MG/2ML IV SOLN
INTRAVENOUS | Status: DC | PRN
  Administered 2017-10-25: 200 mg via INTRAVENOUS

## 2017-10-25 MED ORDER — STERILE WATER FOR IRRIGATION IR SOLN
Status: DC | PRN
  Administered 2017-10-25: 2000 mL

## 2017-10-25 MED ORDER — POLYETHYLENE GLYCOL 3350 17 G PO PACK: 17.0000 g | PACK | Freq: Every day | ORAL | Status: DC | PRN

## 2017-10-25 MED ORDER — HYDROMORPHONE HCL 2 MG PO TABS
2.0000 mg | ORAL_TABLET | ORAL | Status: DC | PRN
  Administered 2017-10-25: 2 mg via ORAL
  Filled 2017-10-25: qty 1

## 2017-10-25 MED ORDER — SUGAMMADEX SODIUM 200 MG/2ML IV SOLN
INTRAVENOUS | Status: AC
  Filled 2017-10-25: qty 2

## 2017-10-25 MED ORDER — PROPOFOL 10 MG/ML IV BOLUS
INTRAVENOUS | Status: AC
  Filled 2017-10-25: qty 40

## 2017-10-25 MED ORDER — FENTANYL CITRATE (PF) 250 MCG/5ML IJ SOLN
INTRAMUSCULAR | Status: AC
  Filled 2017-10-25: qty 5

## 2017-10-25 MED ORDER — FLEET ENEMA 7-19 GM/118ML RE ENEM: 1.0000 | ENEMA | Freq: Once | RECTAL | Status: DC | PRN

## 2017-10-25 MED ORDER — METOCLOPRAMIDE HCL 5 MG/ML IJ SOLN
INTRAMUSCULAR | Status: AC
  Filled 2017-10-25: qty 2

## 2017-10-25 MED ORDER — ACETAMINOPHEN 500 MG PO TABS
1000.0000 mg | ORAL_TABLET | Freq: Four times a day (QID) | ORAL | Status: AC
  Administered 2017-10-25 – 2017-10-26 (×4): 1000 mg via ORAL
  Filled 2017-10-25 (×4): qty 2

## 2017-10-25 MED ORDER — ROCURONIUM BROMIDE 100 MG/10ML IV SOLN
INTRAVENOUS | Status: DC | PRN
  Administered 2017-10-25: 50 mg via INTRAVENOUS

## 2017-10-25 MED ORDER — ACETAMINOPHEN 325 MG PO TABS
650.0000 mg | ORAL_TABLET | ORAL | Status: DC | PRN
Start: 2017-10-26 — End: 2017-10-27

## 2017-10-25 MED ORDER — OXYCODONE HCL 5 MG PO TABS: 10.0000 mg | ORAL_TABLET | ORAL | Status: DC | PRN

## 2017-10-25 MED ORDER — BISACODYL 10 MG RE SUPP: 10.0000 mg | Freq: Every day | RECTAL | Status: DC | PRN

## 2017-10-25 MED ORDER — SODIUM CHLORIDE 0.9 % IJ SOLN
INTRAMUSCULAR | Status: DC | PRN
  Administered 2017-10-25: 60 mL

## 2017-10-25 MED ORDER — METOCLOPRAMIDE HCL 5 MG/ML IJ SOLN: 5.0000 mg | Freq: Three times a day (TID) | INTRAMUSCULAR | Status: DC | PRN

## 2017-10-25 MED ORDER — ACETAMINOPHEN 10 MG/ML IV SOLN
1000.0000 mg | Freq: Once | INTRAVENOUS | Status: AC
  Administered 2017-10-25: 1000 mg via INTRAVENOUS
  Filled 2017-10-25: qty 100

## 2017-10-25 SURGICAL SUPPLY — 50 items
BAG SPEC THK2 15X12 ZIP CLS (MISCELLANEOUS) ×1
BAG ZIPLOCK 12X15 (MISCELLANEOUS) ×3 IMPLANT
BANDAGE ACE 6X5 VEL STRL LF (GAUZE/BANDAGES/DRESSINGS) ×3 IMPLANT
BLADE SAG 18X100X1.27 (BLADE) ×3 IMPLANT
BLADE SAW SGTL 11.0X1.19X90.0M (BLADE) ×3 IMPLANT
BOWL SMART MIX CTS (DISPOSABLE) ×3 IMPLANT
CAPT KNEE TOTAL 3 ATTUNE ×3 IMPLANT
CEMENT HV SMART SET (Cement) ×6 IMPLANT
CLOSURE WOUND 1/2 X4 (GAUZE/BANDAGES/DRESSINGS) ×2
COVER SURGICAL LIGHT HANDLE (MISCELLANEOUS) ×3 IMPLANT
CUFF TOURN SGL QUICK 34 (TOURNIQUET CUFF) ×3
CUFF TRNQT CYL 34X4X40X1 (TOURNIQUET CUFF) ×1 IMPLANT
DECANTER SPIKE VIAL GLASS SM (MISCELLANEOUS) ×3 IMPLANT
DRAPE U-SHAPE 47X51 STRL (DRAPES) ×3 IMPLANT
DRSG ADAPTIC 3X8 NADH LF (GAUZE/BANDAGES/DRESSINGS) ×3 IMPLANT
DURAPREP 26ML APPLICATOR (WOUND CARE) ×3 IMPLANT
ELECT REM PT RETURN 15FT ADLT (MISCELLANEOUS) ×3 IMPLANT
EVACUATOR 1/8 PVC DRAIN (DRAIN) ×3 IMPLANT
FACESHIELD WRAPAROUND (MASK) ×3 IMPLANT
GAUZE SPONGE 4X4 12PLY STRL (GAUZE/BANDAGES/DRESSINGS) ×3 IMPLANT
GLOVE BIOGEL PI IND STRL 6.5 (GLOVE) ×2 IMPLANT
GLOVE BIOGEL PI IND STRL 7.0 (GLOVE) ×2 IMPLANT
GLOVE BIOGEL PI IND STRL 7.5 (GLOVE) ×4 IMPLANT
GLOVE BIOGEL PI IND STRL 8 (GLOVE) ×1 IMPLANT
GLOVE BIOGEL PI INDICATOR 6.5 (GLOVE) ×4
GLOVE BIOGEL PI INDICATOR 7.0 (GLOVE) ×4
GLOVE BIOGEL PI INDICATOR 7.5 (GLOVE) ×8
GLOVE BIOGEL PI INDICATOR 8 (GLOVE) ×2
GLOVE SURG SS PI 6.5 STRL IVOR (GLOVE) ×6 IMPLANT
GLOVE SURG SS PI 8.0 STRL IVOR (GLOVE) ×3 IMPLANT
GOWN STRL REUS W/TWL LRG LVL3 (GOWN DISPOSABLE) ×15 IMPLANT
HANDPIECE INTERPULSE COAX TIP (DISPOSABLE) ×3
IMMOBILIZER KNEE 20 (SOFTGOODS) ×3
IMMOBILIZER KNEE 20 THIGH 36 (SOFTGOODS) ×1 IMPLANT
MANIFOLD NEPTUNE II (INSTRUMENTS) ×3 IMPLANT
PACK TOTAL KNEE CUSTOM (KITS) ×3 IMPLANT
PAD ABD 8X10 STRL (GAUZE/BANDAGES/DRESSINGS) ×3 IMPLANT
PADDING CAST COTTON 6X4 STRL (CAST SUPPLIES) ×9 IMPLANT
POSITIONER SURGICAL ARM (MISCELLANEOUS) ×3 IMPLANT
SET HNDPC FAN SPRY TIP SCT (DISPOSABLE) ×1 IMPLANT
STRIP CLOSURE SKIN 1/2X4 (GAUZE/BANDAGES/DRESSINGS) ×4 IMPLANT
SUT MNCRL AB 4-0 PS2 18 (SUTURE) ×3 IMPLANT
SUT STRATAFIX 0 PDS 27 VIOLET (SUTURE) ×3
SUT VIC AB 2-0 CT1 27 (SUTURE) ×9
SUT VIC AB 2-0 CT1 TAPERPNT 27 (SUTURE) ×3 IMPLANT
SUTURE STRATFX 0 PDS 27 VIOLET (SUTURE) ×1 IMPLANT
SYR 30ML LL (SYRINGE) ×6 IMPLANT
TRAY FOLEY CATH SILVER 16FR LF (SET/KITS/TRAYS/PACK) ×3 IMPLANT
WRAP KNEE MAXI GEL POST OP (GAUZE/BANDAGES/DRESSINGS) ×3 IMPLANT
YANKAUER SUCT BULB TIP 10FT TU (MISCELLANEOUS) ×3 IMPLANT

## 2017-10-25 NOTE — Discharge Instructions (Addendum)
°  ° °Dr. Frank Aluisio °Total Joint Specialist °[facility] °[address] °[place], [place] [address] °[phone] ° °TOTAL KNEE REPLACEMENT POSTOPERATIVE DIRECTIONS ° °Knee Rehabilitation, Guidelines Following Surgery  °Results after knee surgery are often greatly improved when you follow the exercise, range of motion and muscle strengthening exercises prescribed by your doctor. Safety measures are also important to protect the knee from further injury. Any time any of these exercises cause you to have increased pain or swelling in your knee joint, decrease the amount until you are comfortable again and slowly increase them. If you have problems or questions, call your caregiver or physical therapist for advice.  ° °HOME CARE INSTRUCTIONS  °Remove items at home which could result in a fall. This includes throw rugs or furniture in walking pathways.  °· ICE to the affected knee every three hours for 30 minutes at a time and then as needed for pain and swelling.  Continue to use ice on the knee for pain and swelling from surgery. You may notice swelling that will progress down to the foot and ankle.  This is normal after surgery.  Elevate the leg when you are not up walking on it.   °· Continue to use the breathing machine which will help keep your temperature down.  It is common for your temperature to cycle up and down following surgery, especially at night when you are not up moving around and exerting yourself.  The breathing machine keeps your lungs expanded and your temperature down. °· Do not place pillow under knee, focus on keeping the knee straight while resting ° °DIET °You may resume your previous home diet once your are discharged from the hospital. ° °DRESSING / WOUND CARE / SHOWERING °You may shower 3 days after surgery, but keep the wounds dry during showering.  You may use an occlusive plastic wrap (Press'n Seal for example), NO SOAKING/SUBMERGING IN THE BATHTUB.  If the  bandage gets wet, change with a clean dry gauze.  If the incision gets wet, pat the wound dry with a clean towel. °You may start showering once you are discharged home but do not submerge the incision under water. Just pat the incision dry and apply a dry gauze dressing on daily. °Change the surgical dressing daily and reapply a dry dressing each time. ° °ACTIVITY °Walk with your walker as instructed. °Use walker as long as suggested by your caregivers. °Avoid periods of inactivity such as sitting longer than an hour when not asleep. This helps prevent blood clots.  °You may resume a sexual relationship in one month or when given the OK by your doctor.  °You may return to work once you are cleared by your doctor.  °Do not drive a car for 6 weeks or until released by you surgeon.  °Do not drive while taking narcotics. ° °WEIGHT BEARING °Weight bearing as tolerated with assist device (walker, cane, etc) as directed, use it as long as suggested by your surgeon or therapist, typically at least 4-6 weeks. ° °POSTOPERATIVE CONSTIPATION PROTOCOL °Constipation - defined medically as fewer than three stools per week and severe constipation as less than one stool per week. ° °One of the most common issues patients have following surgery is constipation.  Even if you have a regular bowel pattern at home, your normal regimen is likely to be disrupted due to multiple reasons following surgery.  Combination of anesthesia, postoperative narcotics, change in appetite and fluid intake all can affect your   bowels.  In order to avoid complications following surgery, here are some recommendations in order to help you during your recovery period. ° °Colace (docusate) - Pick up an over-the-counter form of Colace or another stool softener and take twice a day as long as you are requiring postoperative pain medications.  Take with a full glass of water daily.  If you experience loose stools or diarrhea, hold the colace until you stool forms  back up.  If your symptoms do not get better within 1 week or if they get worse, check with your doctor. ° °Dulcolax (bisacodyl) - Pick up over-the-counter and take as directed by the product packaging as needed to assist with the movement of your bowels.  Take with a full glass of water.  Use this product as needed if not relieved by Colace only.  ° °MiraLax (polyethylene glycol) - Pick up over-the-counter to have on hand.  MiraLax is a solution that will increase the amount of water in your bowels to assist with bowel movements.  Take as directed and can mix with a glass of water, juice, soda, coffee, or tea.  Take if you go more than two days without a movement. °Do not use MiraLax more than once per day. Call your doctor if you are still constipated or irregular after using this medication for 7 days in a row. ° °If you continue to have problems with postoperative constipation, please contact the office for further assistance and recommendations.  If you experience "the worst abdominal pain ever" or develop nausea or vomiting, please contact the office immediatly for further recommendations for treatment. ° °ITCHING ° If you experience itching with your medications, try taking only a single pain pill, or even half a pain pill at a time.  You can also use Benadryl over the counter for itching or also to help with sleep.  ° °TED HOSE STOCKINGS °Wear the elastic stockings on both legs for three weeks following surgery during the day but you may remove then at night for sleeping. ° °MEDICATIONS °See your medication summary on the “After Visit Summary” that the nursing staff will review with you prior to discharge.  You may have some home medications which will be placed on hold until you complete the course of blood thinner medication.  It is important for you to complete the blood thinner medication as prescribed by your surgeon.  Continue your approved medications as instructed at time of  discharge. ° °PRECAUTIONS °If you experience chest pain or shortness of breath - call 911 immediately for transfer to the hospital emergency department.  °If you develop a fever greater that 101 F, purulent drainage from wound, increased redness or drainage from wound, foul odor from the wound/dressing, or calf pain - CONTACT YOUR SURGEON.   °                                                °FOLLOW-UP APPOINTMENTS °Make sure you keep all of your appointments after your operation with your surgeon and caregivers. You should call the office at the above phone number and make an appointment for approximately two weeks after the date of your surgery or on the date instructed by your surgeon outlined in the "After Visit Summary". ° ° °RANGE OF MOTION AND STRENGTHENING EXERCISES  °Rehabilitation of the knee is important following a knee   injury or an operation. After just a few days of immobilization, the muscles of the thigh which control the knee become weakened and shrink (atrophy). Knee exercises are designed to build up the tone and strength of the thigh muscles and to improve knee motion. Often times heat used for twenty to thirty minutes before working out will loosen up your tissues and help with improving the range of motion but do not use heat for the first two weeks following surgery. These exercises can be done on a training (exercise) mat, on the floor, on a table or on a bed. Use what ever works the best and is most comfortable for you Knee exercises include:  °Leg Lifts - While your knee is still immobilized in a splint or cast, you can do straight leg raises. Lift the leg to 60 degrees, hold for 3 sec, and slowly lower the leg. Repeat 10-20 times 2-3 times daily. Perform this exercise against resistance later as your knee gets better.  °Quad and Hamstring Sets - Tighten up the muscle on the front of the thigh (Quad) and hold for 5-10 sec. Repeat this 10-20 times hourly. Hamstring sets are done by pushing the  foot backward against an object and holding for 5-10 sec. Repeat as with quad sets.  °· Leg Slides: Lying on your back, slowly slide your foot toward your buttocks, bending your knee up off the floor (only go as far as is comfortable). Then slowly slide your foot back down until your leg is flat on the floor again. °· Angel Wings: Lying on your back spread your legs to the side as far apart as you can without causing discomfort.  °A rehabilitation program following serious knee injuries can speed recovery and prevent re-injury in the future due to weakened muscles. Contact your doctor or a physical therapist for more information on knee rehabilitation.  ° °IF YOU ARE TRANSFERRED TO A SKILLED REHAB FACILITY °If the patient is transferred to a skilled rehab facility following release from the hospital, a list of the current medications will be sent to the facility for the patient to continue.  When discharged from the skilled rehab facility, please have the facility set up the patient's Home Health Physical Therapy prior to being released. Also, the skilled facility will be responsible for providing the patient with their medications at time of release from the facility to include their pain medication, the muscle relaxants, and their blood thinner medication. If the patient is still at the rehab facility at time of the two week follow up appointment, the skilled rehab facility will also need to assist the patient in arranging follow up appointment in our office and any transportation needs. ° °MAKE SURE YOU:  °Understand these instructions.  °Get help right away if you are not doing well or get worse.  ° ° °Pick up stool softner and laxative for home use following surgery while on pain medications. °Do not submerge incision under water. °Please use good hand washing techniques while changing dressing each day. °May shower starting three days after surgery. °Please use a clean towel to pat the incision dry following  showers. °Continue to use ice for pain and swelling after surgery. °Do not use any lotions or creams on the incision until instructed by your surgeon. ° °Take Xarelto for two and a half more weeks following discharge from the hospital, then discontinue Xarelto. °Once the patient has completed the blood thinner regimen, then take a Baby 81 mg Aspirin daily   for three more weeks. ° ° °Information on my medicine - XARELTO® (Rivaroxaban) ° ° °Why was Xarelto® prescribed for you? °Xarelto® was prescribed for you to reduce the risk of blood clots forming after orthopedic surgery. The medical term for these abnormal blood clots is venous thromboembolism (VTE). ° °What do you need to know about xarelto® ? °Take your Xarelto® ONCE DAILY at the same time every day. °You may take it either with or without food. ° °If you have difficulty swallowing the tablet whole, you may crush it and mix in applesauce just prior to taking your dose. ° °Take Xarelto® exactly as prescribed by your doctor and DO NOT stop taking Xarelto® without talking to the doctor who prescribed the medication.  Stopping without other VTE prevention medication to take the place of Xarelto® may increase your risk of developing a clot. ° °After discharge, you should have regular check-up appointments with your healthcare provider that is prescribing your Xarelto®.   ° °What do you do if you miss a dose? °If you miss a dose, take it as soon as you remember on the same day then continue your regularly scheduled once daily regimen the next day. Do not take two doses of Xarelto® on the same day.  ° °Important Safety Information °A possible side effect of Xarelto® is bleeding. You should call your healthcare provider right away if you experience any of the following: °? Bleeding from an injury or your nose that does not stop. °? Unusual colored urine (red or dark brown) or unusual colored stools (red or black). °? Unusual bruising for unknown reasons. °? A serious  fall or if you hit your head (even if there is no bleeding). ° °Some medicines may interact with Xarelto® and might increase your risk of bleeding while on Xarelto®. To help avoid this, consult your healthcare provider or pharmacist prior to using any new prescription or non-prescription medications, including herbals, vitamins, non-steroidal anti-inflammatory drugs (NSAIDs) and supplements. ° °This website has more information on Xarelto®: www.xarelto.com. ° ° °

## 2017-10-25 NOTE — Progress Notes (Signed)
AssistedDr. Edwards with right, ultrasound guided, adductor canal block. Side rails up, monitors on throughout procedure. See vital signs in flow sheet. Tolerated Procedure well.  

## 2017-10-25 NOTE — Transfer of Care (Signed)
Immediate Anesthesia Transfer of Care Note  Patient: Sarah Hicks  Procedure(s) Performed: RIGHT TOTAL KNEE ARTHROPLASTY (Right Knee)  Patient Location: PACU  Anesthesia Type:General  Level of Consciousness: awake, alert  and oriented  Airway & Oxygen Therapy: Patient Spontanous Breathing and Patient connected to face mask oxygen  Post-op Assessment: Report given to RN and Post -op Vital signs reviewed and stable  Post vital signs: Reviewed and stable  Last Vitals:  Vitals:   10/25/17 0604 10/25/17 0708  BP: (!) 142/85   Pulse: 85   Resp: 16   Temp: 36.8 C   SpO2: 99% 98%    Last Pain:  Vitals:   10/25/17 0715  TempSrc:   PainSc: 0-No pain      Patients Stated Pain Goal: 0 (28/36/62 9476)  Complications: No apparent anesthesia complications

## 2017-10-25 NOTE — Anesthesia Procedure Notes (Signed)
Anesthesia Regional Block: Adductor canal block   Pre-Anesthetic Checklist: ,, timeout performed, Correct Patient, Correct Site, Correct Laterality, Correct Procedure, Correct Position, site marked, Risks and benefits discussed,  Surgical consent,  Pre-op evaluation,  At surgeon's request and post-op pain management  Laterality: Right  Prep: chloraprep       Needles:  Injection technique: Single-shot  Needle Type: Stimulator Needle - 80          Additional Needles:   Procedures: Doppler guided,,,, ultrasound used (permanent image in chart),,,,  Narrative:  Start time: 10/25/2017 6:55 AM End time: 10/25/2017 7:10 AM Injection made incrementally with aspirations every 5 mL.  Performed by: Personally  Anesthesiologist: Belinda Block, MD

## 2017-10-25 NOTE — Op Note (Signed)
OPERATIVE REPORT-TOTAL KNEE ARTHROPLASTY   Pre-operative diagnosis- Osteoarthritis  Right knee(s)  Post-operative diagnosis- Osteoarthritis Right knee(s)  Procedure-  Right  Total Knee Arthroplasty (Depuy Attune)  Surgeon- Dione Plover. Labria Wos, MD  Assistant- Ardeen Jourdain, PA-C   Anesthesia-  GA combined with regional for post-op pain  EBL-25 ml   Drains Hemovac  Tourniquet time-  Total Tourniquet Time Documented: Thigh (Right) - 34 minutes Total: Thigh (Right) - 34 minutes     Complications- None  Condition-PACU - hemodynamically stable.   Brief Clinical Note  Sarah Hicks is a 57 y.o. year old female with end stage OA of her right knee with progressively worsening pain and dysfunction. She has constant pain, with activity and at rest and significant functional deficits with difficulties even with ADLs. She has had extensive non-op management including analgesics, injections of cortisone and viscosupplements, and home exercise program, but remains in significant pain with significant dysfunction.Radiographs show bone on bone arthritis medial and patellofemoral. She presents now for right Total Knee Arthroplasty.    Procedure in detail---   The patient is brought into the operating room and positioned supine on the operating table. After successful administration of  GA combined with regional for post-op pain,   a tourniquet is placed high on the  Right thigh(s) and the lower extremity is prepped and draped in the usual sterile fashion. Time out is performed by the operating team and then the  Right lower extremity is wrapped in Esmarch, knee flexed and the tourniquet inflated to 300 mmHg.       A midline incision is made with a ten blade through the subcutaneous tissue to the level of the extensor mechanism. A fresh blade is used to make a medial parapatellar arthrotomy. Soft tissue over the proximal medial tibia is subperiosteally elevated to the joint line with a knife and  into the semimembranosus bursa with a Cobb elevator. Soft tissue over the proximal lateral tibia is elevated with attention being paid to avoiding the patellar tendon on the tibial tubercle. The patella is everted, knee flexed 90 degrees and the ACL and PCL are removed. Findings are bone on bone medial and patellofemoral with large global osteophytes.        The drill is used to create a starting hole in the distal femur and the canal is thoroughly irrigated with sterile saline to remove the fatty contents. The 5 degree Right  valgus alignment guide is placed into the femoral canal and the distal femoral cutting block is pinned to remove 9 mm off the distal femur. Resection is made with an oscillating saw.      The tibia is subluxed forward and the menisci are removed. The extramedullary alignment guide is placed referencing proximally at the medial aspect of the tibial tubercle and distally along the second metatarsal axis and tibial crest. The block is pinned to remove 18mm off the more deficient medial  side. Resection is made with an oscillating saw. Size 6is the most appropriate size for the tibia and the proximal tibia is prepared with the modular drill and keel punch for that size.      The femoral sizing guide is placed and size 6 is most appropriate. Rotation is marked off the epicondylar axis and confirmed by creating a rectangular flexion gap at 90 degrees. The size 6 cutting block is pinned in this rotation and the anterior, posterior and chamfer cuts are made with the oscillating saw. The intercondylar block is then placed and  that cut is made.      Trial size 6 tibial component, trial size 6 posterior stabilized femur and a 8  mm posterior stabilized rotating platform insert trial is placed. Full extension is achieved with excellent varus/valgus and anterior/posterior balance throughout full range of motion. The patella is everted and thickness measured to be 23  mm. Free hand resection is taken to  14 mm, a 38 template is placed, lug holes are drilled, trial patella is placed, and it tracks normally. Osteophytes are removed off the posterior femur with the trial in place. All trials are removed and the cut bone surfaces prepared with pulsatile lavage. Cement is mixed and once ready for implantation, the size 6 tibial implant, size  6 posterior stabilized femoral component, and the size 38 patella are cemented in place and the patella is held with the clamp. The trial insert is placed and the knee held in full extension. The Exparel (20 ml mixed with 60 ml saline) is injected into the extensor mechanism, posterior capsule, medial and lateral gutters and subcutaneous tissues.  All extruded cement is removed and once the cement is hard the permanent 8 mm posterior stabilized rotating platform insert is placed into the tibial tray.      The wound is copiously irrigated with saline solution and the extensor mechanism closed over a hemovac drain with #1 V-loc suture. The tourniquet is released for a total tourniquet time of 34  minutes. Flexion against gravity is 140 degrees and the patella tracks normally. Subcutaneous tissue is closed with 2.0 vicryl and subcuticular with running 4.0 Monocryl. The incision is cleaned and dried and steri-strips and a bulky sterile dressing are applied. The limb is placed into a knee immobilizer and the patient is awakened and transported to recovery in stable condition.      Please note that a surgical assistant was a medical necessity for this procedure in order to perform it in a safe and expeditious manner. Surgical assistant was necessary to retract the ligaments and vital neurovascular structures to prevent injury to them and also necessary for proper positioning of the limb to allow for anatomic placement of the prosthesis.   Dione Plover Jeromey Kruer, MD    10/25/2017, 8:20 AM

## 2017-10-25 NOTE — Anesthesia Postprocedure Evaluation (Signed)
Anesthesia Post Note  Patient: Sarah Hicks  Procedure(s) Performed: RIGHT TOTAL KNEE ARTHROPLASTY (Right Knee)     Patient location during evaluation: PACU Anesthesia Type: General Level of consciousness: awake Pain management: pain level controlled Vital Signs Assessment: post-procedure vital signs reviewed and stable Respiratory status: spontaneous breathing Cardiovascular status: stable Anesthetic complications: no    Last Vitals:  Vitals:   10/25/17 1200 10/25/17 1300  BP: 132/83 134/86  Pulse: 72 77  Resp: 16 15  Temp: 36.9 C 36.6 C  SpO2: 100% 99%    Last Pain:  Vitals:   10/25/17 1420  TempSrc:   PainSc: 2                  Gwyn Mehring

## 2017-10-25 NOTE — Anesthesia Preprocedure Evaluation (Addendum)
Anesthesia Evaluation  Patient identified by MRN, date of birth, ID band Patient awake    Reviewed: Allergy & Precautions, NPO status , Patient's Chart, lab work & pertinent test results  Airway Mallampati: II  TM Distance: >3 FB     Dental   Pulmonary    breath sounds clear to auscultation       Cardiovascular hypertension,  Rhythm:Regular Rate:Normal     Neuro/Psych    GI/Hepatic Neg liver ROS, GERD  ,  Endo/Other  negative endocrine ROS  Renal/GU negative Renal ROS     Musculoskeletal   Abdominal   Peds  Hematology   Anesthesia Other Findings   Reproductive/Obstetrics                             Anesthesia Physical Anesthesia Plan  ASA: III  Anesthesia Plan: General   Post-op Pain Management:  Regional for Post-op pain   Induction: Intravenous  PONV Risk Score and Plan: 2 and Treatment may vary due to age or medical condition  Airway Management Planned: Oral ETT  Additional Equipment:   Intra-op Plan:   Post-operative Plan: Extubation in OR  Informed Consent: I have reviewed the patients History and Physical, chart, labs and discussed the procedure including the risks, benefits and alternatives for the proposed anesthesia with the patient or authorized representative who has indicated his/her understanding and acceptance.   Dental advisory given  Plan Discussed with: CRNA and Anesthesiologist  Anesthesia Plan Comments:        Anesthesia Quick Evaluation

## 2017-10-25 NOTE — Evaluation (Signed)
Physical Therapy Evaluation Patient Details Name: Sarah Hicks MRN: [id number] DOB: 03/12/1961 Today's Date: 10/25/2017   History of Present Illness  Pt is a 57 yo female, s/p R TKA  Clinical Impression  Pt is s/p R TKA resulting in the deficits listed below (See PT problem list). PT will benefit from skilled PT to increase their independence and safety with mobility to allow discharge. Pt was awake but still drowsy, with spouse/family present. Reported decreased pain after being on CPM. Attempted ambulation with RW in hall, limited by pain and fatigue. Pt and family report she has outpatient PT scheduled, starting Friday (10/29/17).     Follow Up Recommendations DC plan and follow up therapy as arranged by surgeon;Outpatient PT    Equipment Recommendations  Rolling walker with 5" wheels;3in1 (PT)    Recommendations for Other Services       Precautions / Restrictions Precautions Precautions: Fall;Knee Required Braces or Orthoses: Knee Immobilizer - Right Knee Immobilizer - Right: On when out of bed or walking;Discontinue once straight leg raise with < 10 degree lag Restrictions Other Position/Activity Restrictions: WBAT      Mobility  Bed Mobility Overal bed mobility: Needs Assistance Bed Mobility: Supine to Sit     Supine to sit: HOB elevated;Min assist     General bed mobility comments: Assist needed with moving RLE to edge of bed  Transfers Overall transfer level: Needs assistance Equipment used: Rolling walker (2 wheeled) Transfers: Sit to/from Stand Sit to Stand: Min assist         General transfer comment: verbal cues for UE/LE placement, safety  Ambulation/Gait Ambulation/Gait assistance: Min assist Ambulation Distance (Feet): 16 Feet Assistive device: Rolling walker (2 wheeled) Gait Pattern/deviations: Step-to pattern;Decreased weight shift to right;Decreased stance time - right;Antalgic Gait velocity: decrease   General Gait Details: verbal cues  for sequence, RW positioning, step length, posture; Pt began to feel tired quickly, followed with recliner but was able to return to room without sitting  Stairs            Wheelchair Mobility    Modified Rankin (Stroke Patients Only)       Balance Overall balance assessment: Needs assistance Sitting-balance support: Bilateral upper extremity supported;Feet supported Sitting balance-Leahy Scale: Poor     Standing balance support: Bilateral upper extremity supported Standing balance-Leahy Scale: Poor                               Pertinent Vitals/Pain Pain Assessment: 0-10 Pain Score: 5  Pain Location: R knee Pain Descriptors / Indicators: Aching;Burning;Dull Pain Intervention(s): Limited activity within patient's tolerance;Premedicated before session;Monitored during session;Repositioned    Home Living Family/patient expects to be discharged to:: Private residence Living Arrangements: Spouse/significant other Available Help at Discharge: Family Type of Home: House Home Access: Stairs to enter   Technical brewer of Steps: 2 small steps Home Layout: One level Home Equipment: None      Prior Function Level of Independence: Independent               Hand Dominance        Extremity/Trunk Assessment        Lower Extremity Assessment Lower Extremity Assessment: RLE deficits/detail RLE Deficits / Details: unable to perform SLR, maintained KI, ROM TBA    Cervical / Trunk Assessment Cervical / Trunk Assessment: Normal  Communication   Communication: No difficulties  Cognition Arousal/Alertness: Suspect due to medications Behavior During Therapy: [facility] for  tasks assessed/performed Overall Cognitive Status: Within Functional Limits for tasks assessed                                 General Comments: drowsy, not fully alert yet      General Comments      Exercises Total Joint Exercises Ankle Circles/Pumps: AROM;20  reps;Both   Assessment/Plan    PT Assessment Patient needs continued PT services  PT Problem List Decreased strength;Decreased range of motion;Decreased mobility;Pain;Decreased balance;Decreased knowledge of precautions;Decreased knowledge of use of DME       PT Treatment Interventions DME instruction;Gait training;Stair training;Therapeutic activities;Therapeutic exercise;Patient/family education    PT Goals (Current goals can be found in the Care Plan section)  Acute Rehab PT Goals Patient Stated Goal: to go home PT Goal Formulation: With patient/family Time For Goal Achievement: 11/01/17 Potential to Achieve Goals: Good    Frequency 7X/week   Barriers to discharge        Co-evaluation               AM-PAC PT "6 Clicks" Daily Activity  Outcome Measure Difficulty turning over in bed (including adjusting bedclothes, sheets and blankets)?: A Lot Difficulty moving from lying on back to sitting on the side of the bed? : Unable Difficulty sitting down on and standing up from a chair with arms (e.g., wheelchair, bedside commode, etc,.)?: Unable Help needed moving to and from a bed to chair (including a wheelchair)?: A Little Help needed walking in hospital room?: A Little Help needed climbing 3-5 steps with a railing? : A Lot 6 Click Score: 12    End of Session Equipment Utilized During Treatment: Gait belt;Right knee immobilizer Activity Tolerance: Patient limited by fatigue;Patient limited by pain Patient left: in chair;with family/visitor present;with call bell/phone within reach Nurse Communication: Mobility status PT Visit Diagnosis: Difficulty in walking, not elsewhere classified (R26.2)    Time: 4536-4680 PT Time Calculation (min) (ACUTE ONLY): 28 min   Charges:   PT Evaluation $PT Eval Low Complexity: 1 Low     PT G Codes:        [place] Toriana Sponsel, SPT   [place] Gracen [place] 10/25/2017, 5:11 PM

## 2017-10-25 NOTE — Interval H&P Note (Signed)
History and Physical Interval Note:  10/25/2017 6:41 AM  Sarah Hicks  has presented today for surgery, with the diagnosis of right knee osteoarthritis  The various methods of treatment have been discussed with the patient and family. After consideration of risks, benefits and other options for treatment, the patient has consented to  Procedure(s): RIGHT TOTAL KNEE ARTHROPLASTY (Right) as a surgical intervention .  The patient's history has been reviewed, patient examined, no change in status, stable for surgery.  I have reviewed the patient's chart and labs.  Questions were answered to the patient's satisfaction.     Pilar Plate Chinita Schimpf

## 2017-10-25 NOTE — Anesthesia Procedure Notes (Signed)
Procedure Name: Intubation Date/Time: 10/25/2017 7:26 AM Performed by: Glory Buff, CRNA Pre-anesthesia Checklist: Patient identified, Emergency Drugs available, Suction available and Patient being monitored Patient Re-evaluated:Patient Re-evaluated prior to induction Oxygen Delivery Method: Circle system utilized Preoxygenation: Pre-oxygenation with 100% oxygen Induction Type: IV induction Ventilation: Mask ventilation without difficulty Laryngoscope Size: Miller and 3 Grade View: Grade II Tube type: Oral Tube size: 7.0 mm Number of attempts: 1 Airway Equipment and Method: Stylet and Oral airway Placement Confirmation: ETT inserted through vocal cords under direct vision,  positive ETCO2 and breath sounds checked- equal and bilateral Secured at: 20 cm Tube secured with: Tape Dental Injury: Teeth and Oropharynx as per pre-operative assessment

## 2017-10-26 ENCOUNTER — Encounter (HOSPITAL_COMMUNITY): Payer: Self-pay | Admitting: Orthopedic Surgery

## 2017-10-26 LAB — CBC
HCT: 35.2 % — ABNORMAL LOW (ref 36.0–46.0)
Hemoglobin: 11.2 g/dL — ABNORMAL LOW (ref 12.0–15.0)
MCH: 29.1 pg (ref 26.0–34.0)
MCHC: 31.8 g/dL (ref 30.0–36.0)
MCV: 91.4 fL (ref 78.0–100.0)
PLATELETS: 245 10*3/uL (ref 150–400)
RBC: 3.85 MIL/uL — AB (ref 3.87–5.11)
RDW: 14.1 % (ref 11.5–15.5)
WBC: 16.1 10*3/uL — AB (ref 4.0–10.5)

## 2017-10-26 LAB — BASIC METABOLIC PANEL
Anion gap: 8 (ref 5–15)
BUN: 9 mg/dL (ref 6–20)
CO2: 25 mmol/L (ref 22–32)
Calcium: 8.5 mg/dL — ABNORMAL LOW (ref 8.9–10.3)
Chloride: 107 mmol/L (ref 101–111)
Creatinine, Ser: 0.82 mg/dL (ref 0.44–1.00)
GFR calc Af Amer: >60 mL/min (ref >60)
Glucose, Bld: 121 mg/dL — ABNORMAL HIGH (ref 65–99)
POTASSIUM: 4.4 mmol/L (ref 3.5–5.1)
SODIUM: 140 mmol/L (ref 135–145)

## 2017-10-26 MED ORDER — TRAMADOL HCL 50 MG PO TABS
50.0000 mg | ORAL_TABLET | Freq: Four times a day (QID) | ORAL | Status: DC | PRN
  Administered 2017-10-27: 01:00:00 100 mg via ORAL
  Filled 2017-10-26: qty 2

## 2017-10-26 MED ORDER — METHOCARBAMOL 500 MG PO TABS: 500.0000 mg | ORAL_TABLET | Freq: Four times a day (QID) | ORAL | 0 refills | Status: DC | PRN

## 2017-10-26 MED ORDER — RIVAROXABAN 10 MG PO TABS: 10.0000 mg | ORAL_TABLET | Freq: Every day | ORAL | 0 refills | Status: DC

## 2017-10-26 MED ORDER — HYDROMORPHONE HCL 2 MG PO TABS: 2.0000 mg | ORAL_TABLET | ORAL | 0 refills | Status: DC | PRN

## 2017-10-26 MED ORDER — TRAMADOL HCL 50 MG PO TABS: 50.0000 mg | ORAL_TABLET | Freq: Four times a day (QID) | ORAL | 0 refills | Status: DC | PRN

## 2017-10-26 NOTE — Plan of Care (Signed)
Plan of care reviewed with patient.

## 2017-10-26 NOTE — Evaluation (Signed)
Occupational Therapy Evaluation Patient Details Name: Sarah Hicks MRN: [id number] DOB: Jul 03, 1961 Today's Date: 10/26/2017    History of Present Illness Pt is a 57 yo female, s/p R TKA   Clinical Impression   Pt was admitted for the above sx. Pt was limited by pain but was able to walk to bathroom this session. Will follow in acute to further educate on safe bathroom transfers. She will benefit from a 3:1 commode    Follow Up Recommendations  Supervision/Assistance - 24 hour    Equipment Recommendations  3 in 1 bedside commode    Recommendations for Other Services       Precautions / Restrictions Precautions Precautions: Fall;Knee Required Braces or Orthoses: Knee Immobilizer - Right Knee Immobilizer - Right: On when out of bed or walking;Discontinue once straight leg raise with < 10 degree lag Restrictions Other Position/Activity Restrictions: WBAT      Mobility Bed Mobility               General bed mobility comments: oob  Transfers   Equipment used: Rolling walker (2 wheeled)   Sit to Stand: Min guard         General transfer comment: for safety    Balance                                           ADL either performed or assessed with clinical judgement   ADL Overall ADL's : Needs assistance/impaired     Grooming: Wash/dry hands;Standing;Min guard       Lower Body Bathing: Minimal assistance;Sit to/from stand       Lower Body Dressing: Moderate assistance;Sit to/from stand   Toilet Transfer: Min guard;Ambulation;BSC;RW   Toileting- Water quality scientist and Hygiene: Min guard;Sit to/from stand         General ADL Comments: pt can perform UB adls with set up. Educated on sidestepping through our tight spaces here; her home is open.  Reviewed precautions and assisted with KI.     Vision         Perception     Praxis      Pertinent Vitals/Pain Pain Score: 6  Pain Location: R knee Pain Descriptors /  Indicators: Burning Pain Intervention(s): Limited activity within patient's tolerance;Monitored during session;Premedicated before session;Repositioned;Ice applied     Hand Dominance     Extremity/Trunk Assessment Upper Extremity Assessment Upper Extremity Assessment: Overall WFL for tasks assessed           Communication Communication Communication: No difficulties   Cognition Arousal/Alertness: Awake/alert Behavior During Therapy: WFL for tasks assessed/performed Overall Cognitive Status: Within Functional Limits for tasks assessed                                     General Comments       Exercises     Shoulder Instructions      Home Living Family/patient expects to be discharged to:: Private residence Living Arrangements: Spouse/significant other Available Help at Discharge: Family Type of Home: House             Bathroom Shower/Tub: Walk-in Corporate treasurer Toilet: Standard     Home Equipment: None          Prior Functioning/Environment Level of Independence: Independent  OT Problem List: Decreased knowledge of use of DME or AE;Decreased activity tolerance;Pain      OT Treatment/Interventions: Self-care/ADL training;DME and/or AE instruction;Patient/family education    OT Goals(Current goals can be found in the care plan section) Acute Rehab OT Goals Patient Stated Goal: to go home OT Goal Formulation: With patient Time For Goal Achievement: 11/09/17 Potential to Achieve Goals: Good ADL Goals Pt Will Transfer to Toilet: with supervision;bedside commode;ambulating Pt Will Perform Tub/Shower Transfer: Shower transfer;ambulating;with min guard assist  OT Frequency: Min 2X/week   Barriers to D/C:            Co-evaluation              AM-PAC PT "6 Clicks" Daily Activity     Outcome Measure Help from another person eating meals?: None Help from another person taking care of personal grooming?: A  Little Help from another person toileting, which includes using toliet, bedpan, or urinal?: A Little Help from another person bathing (including washing, rinsing, drying)?: A Little Help from another person to put on and taking off regular upper body clothing?: A Little Help from another person to put on and taking off regular lower body clothing?: A Lot 6 Click Score: 18   End of Session    Activity Tolerance: Patient tolerated treatment well Patient left: in chair;with call bell/phone within reach;with family/visitor present  OT Visit Diagnosis: Pain Pain - Right/Left: Right Pain - part of body: Knee                Time: 1257-1315 OT Time Calculation (min): 18 min Charges:  OT General Charges $OT Visit: 1 Visit OT Evaluation $OT Eval Low Complexity: 1 Low G-Codes:     [place], OTR/L [phone] 10/26/2017  Sarah Hicks 10/26/2017, 3:11 PM

## 2017-10-26 NOTE — Progress Notes (Signed)
Physical Therapy Treatment Patient Details Name: Sarah Hicks MRN: [id number] DOB: 01-13-1961 Today's Date: 10/26/2017    History of Present Illness Pt is a 57 yo female, s/p R TKA    PT Comments    POD #1 Applied Ki and assisted OOB to amb a greater distance. Performed some TKR TE's then applied ICE.    Follow Up Recommendations  DC plan and follow up therapy as arranged by surgeon;Outpatient PT     Equipment Recommendations  Rolling walker with 5" wheels;3in1 (PT)    Recommendations for Other Services       Precautions / Restrictions Precautions Precautions: Fall;Knee Precaution Comments: instructed on KI use and proper application Required Braces or Orthoses: Knee Immobilizer - Right Knee Immobilizer - Right: On when out of bed or walking;Discontinue once straight leg raise with < 10 degree lag Restrictions Weight Bearing Restrictions: No Other Position/Activity Restrictions: WBAT    Mobility  Bed Mobility Overal bed mobility: Needs Assistance Bed Mobility: Supine to Sit     Supine to sit: HOB elevated;Min assist     General bed mobility comments: assist with R LE and increased time with 25% VC's on proper tech   Transfers Overall transfer level: Needs assistance Equipment used: Rolling walker (2 wheeled) Transfers: Sit to/from Stand Sit to Stand: Min guard         General transfer comment: 50% VC's on proper tech and safety with turns  Ambulation/Gait Ambulation/Gait assistance: Min guard Ambulation Distance (Feet): 35 Feet Assistive device: Rolling walker (2 wheeled) Gait Pattern/deviations: Step-to pattern;Decreased weight shift to right;Decreased stance time - right;Antalgic Gait velocity: decrease   General Gait Details: tolerated an increased distance with 25% VC's on proper walker toself distance and upright posture   Stairs            Wheelchair Mobility    Modified Rankin (Stroke Patients Only)       Balance                                             Cognition Arousal/Alertness: Awake/alert Behavior During Therapy: WFL for tasks assessed/performed Overall Cognitive Status: Within Functional Limits for tasks assessed                                 General Comments: slightly drowsy      Exercises   Total Knee Replacement TE's 10 reps B LE ankle pumps 10 reps towel squeezes  Followed by ICE    General Comments        Pertinent Vitals/Pain Pain Assessment: 0-10 Pain Score: 2  Pain Location: R knee Pain Descriptors / Indicators: Operative site guarding;Discomfort Pain Intervention(s): Monitored during session;Repositioned;Ice applied;Premedicated before session    Home Living Family/patient expects to be discharged to:: Private residence Living Arrangements: Spouse/significant other Available Help at Discharge: Family Type of Home: House       Home Equipment: None      Prior Function Level of Independence: Independent          PT Goals (current goals can now be found in the care plan section) Acute Rehab PT Goals Patient Stated Goal: to go home Progress towards PT goals: Progressing toward goals    Frequency    7X/week      PT Plan Current plan remains appropriate  Co-evaluation              AM-PAC PT "6 Clicks" Daily Activity  Outcome Measure  Difficulty turning over in bed (including adjusting bedclothes, sheets and blankets)?: A Lot Difficulty moving from lying on back to sitting on the side of the bed? : Unable Difficulty sitting down on and standing up from a chair with arms (e.g., wheelchair, bedside commode, etc,.)?: Unable Help needed moving to and from a bed to chair (including a wheelchair)?: A Little Help needed walking in hospital room?: A Little Help needed climbing 3-5 steps with a railing? : A Lot 6 Click Score: 12    End of Session Equipment Utilized During Treatment: Gait belt;Right knee  immobilizer Activity Tolerance: Patient tolerated treatment well Patient left: in chair;with family/visitor present;with call bell/phone within reach Nurse Communication: Mobility status PT Visit Diagnosis: Difficulty in walking, not elsewhere classified (R26.2)     Time: 7078-6754 PT Time Calculation (min) (ACUTE ONLY): 30 min  Charges:  $Gait Training: 8-22 mins $Therapeutic Exercise: 8-22 mins                    G Codes:       Rica Koyanagi  PTA [facility]  Acute  Rehab Pager      [phone]

## 2017-10-26 NOTE — Progress Notes (Signed)
Physical Therapy Treatment Patient Details Name: Sarah Hicks MRN: [id number] DOB: 06-16-1961 Today's Date: 10/26/2017    History of Present Illness Pt is a 57 yo female, s/p R TKA    PT Comments    POD # 1 pm session Applied KI and instructed pt and family on use.  Assisted with amb a greater distance then back to bed.    Follow Up Recommendations  DC plan and follow up therapy as arranged by surgeon;Outpatient PT     Equipment Recommendations  Rolling walker with 5" wheels;3in1 (PT)    Recommendations for Other Services       Precautions / Restrictions Precautions Precautions: Fall;Knee Precaution Comments: instructed on KI use and proper application Required Braces or Orthoses: Knee Immobilizer - Right Knee Immobilizer - Right: On when out of bed or walking;Discontinue once straight leg raise with < 10 degree lag Restrictions Weight Bearing Restrictions: No Other Position/Activity Restrictions: WBAT    Mobility  Bed Mobility Overal bed mobility: Needs Assistance Bed Mobility: Sit to Supine     Supine to sit: HOB elevated;Min assist     General bed mobility comments: assisted back to bed  Transfers Overall transfer level: Needs assistance Equipment used: Rolling walker (2 wheeled) Transfers: Sit to/from Stand Sit to Stand: Min guard         General transfer comment: 50% VC's on proper tech and safety with turns  Ambulation/Gait Ambulation/Gait assistance: Min guard Ambulation Distance (Feet): 78 Feet Assistive device: Rolling walker (2 wheeled) Gait Pattern/deviations: Step-to pattern;Decreased weight shift to right;Decreased stance time - right;Antalgic Gait velocity: decrease   General Gait Details: tolerated an increased distance with 25% VC's on proper walker toself distance and upright posture   Stairs            Wheelchair Mobility    Modified Rankin (Stroke Patients Only)       Balance                                            Cognition Arousal/Alertness: Awake/alert Behavior During Therapy: WFL for tasks assessed/performed Overall Cognitive Status: Within Functional Limits for tasks assessed                                 General Comments: slightly drowsy      Exercises      General Comments        Pertinent Vitals/Pain Pain Assessment: 0-10 Pain Score: 2  Pain Location: R knee Pain Descriptors / Indicators: Operative site guarding;Discomfort Pain Intervention(s): Monitored during session;Repositioned;Ice applied;Premedicated before session    Home Living Family/patient expects to be discharged to:: Private residence Living Arrangements: Spouse/significant other Available Help at Discharge: Family Type of Home: House       Home Equipment: None      Prior Function Level of Independence: Independent          PT Goals (current goals can now be found in the care plan section) Acute Rehab PT Goals Patient Stated Goal: to go home Progress towards PT goals: Progressing toward goals    Frequency    7X/week      PT Plan Current plan remains appropriate    Co-evaluation              AM-PAC PT "6 Clicks" Daily Activity  Outcome  Measure  Difficulty turning over in bed (including adjusting bedclothes, sheets and blankets)?: A Lot Difficulty moving from lying on back to sitting on the side of the bed? : Unable Difficulty sitting down on and standing up from a chair with arms (e.g., wheelchair, bedside commode, etc,.)?: Unable Help needed moving to and from a bed to chair (including a wheelchair)?: A Little Help needed walking in hospital room?: A Little Help needed climbing 3-5 steps with a railing? : A Lot 6 Click Score: 12    End of Session Equipment Utilized During Treatment: Gait belt;Right knee immobilizer Activity Tolerance: Patient tolerated treatment well Patient left: in chair;with family/visitor present;with call bell/phone  within reach Nurse Communication: Mobility status PT Visit Diagnosis: Difficulty in walking, not elsewhere classified (R26.2)     Time: 2119-4174 PT Time Calculation (min) (ACUTE ONLY): 28 min  Charges:  $Gait Training: 8-22 mins $Therapeutic Activity: 8-22 mins                    G Codes:       Rica Koyanagi  PTA [facility]  Acute  Rehab Pager      [phone]

## 2017-10-26 NOTE — Progress Notes (Signed)
   Subjective: 1 Day Post-Op Procedure(s) (LRB): RIGHT TOTAL KNEE ARTHROPLASTY (Right) Patient reports pain as mild.   Patient seen in rounds for Dr. Wynelle Link. Husband in room at bedside. Patient is well, but has had some minor complaints of pain in the knee, requiring pain medications We will resume therapy today.  She walked short distance yesterday with therapy. Plan is to go Home after hospital stay.  Objective: Vital signs in last 24 hours: Temp:  [97.5 F (36.4 C)-98.3 F (36.8 C)] 97.6 F (36.4 C) (01/15 1059) Pulse Rate:  [68-75] 68 (01/15 1059) Resp:  [16-18] 17 (01/15 1059) BP: (114-137)/(64-87) 114/64 (01/15 1059) SpO2:  [96 %-97 %] 96 % (01/15 1059)  Intake/Output from previous day:  Intake/Output Summary (Last 24 hours) at 10/26/2017 1435 Last data filed at 10/26/2017 1329 Gross per 24 hour  Intake 2196.25 ml  Output 2500 ml  Net -303.75 ml    Intake/Output this shift: Total I/O In: 240 [P.O.:240] Out: 150 [Urine:150]  Labs: Recent Labs    10/26/17 0606  HGB 11.2*   Recent Labs    10/26/17 0606  WBC 16.1*  RBC 3.85*  HCT 35.2*  PLT 245   Recent Labs    10/26/17 0606  NA 140  K 4.4  CL 107  CO2 25  BUN 9  CREATININE 0.82  GLUCOSE 121*  CALCIUM 8.5*   No results for input(s): LABPT, INR in the last 72 hours.  EXAM General - Patient is Alert, Appropriate and Oriented Extremity - Neurovascular intact Sensation intact distally Intact pulses distally Dressing - dressing C/D/I Motor Function - intact, moving foot and toes well on exam.  Hemovac pulled without difficulty.  Past Medical History:  Diagnosis Date  . Arthritis    oa  . Complication of anesthesia    told to breathe twice after a morton's neuroma surgery 2003, did fine with other surgeries since  . GERD (gastroesophageal reflux disease)   . Headache   . History of kidney stones    passed on own  . Hypertension   . Medical history non-contributory   . Scoliosis   .  Vitamin D deficiency 03/25/2016    Assessment/Plan: 1 Day Post-Op Procedure(s) (LRB): RIGHT TOTAL KNEE ARTHROPLASTY (Right) Principal Problem:   OA (osteoarthritis) of knee  Estimated body mass index is 35.32 kg/m as calculated from the following:   Height as of this encounter: 5' 4.5" (1.638 m).   Weight as of this encounter: 94.8 kg (209 lb). Up with therapy Plan for discharge tomorrow  DVT Prophylaxis - Xarelto Weight-Bearing as tolerated to right leg D/C O2 and Pulse OX and try on Room Air  Arlee Muslim, PA-C Orthopaedic Surgery 10/26/2017, 2:35 PM

## 2017-10-26 NOTE — Discharge Summary (Signed)
Physician Discharge Summary   Patient ID: Sarah Hicks MRN: [id number] DOB/AGE: 19-Jul-1961 57 y.o.  Admit date: 10/25/2017 Discharge date: 10-27-2017  Primary Diagnosis:  Osteoarthritis Right knee(s)   Admission Diagnoses:  Past Medical History:  Diagnosis Date  . Arthritis    oa  . Complication of anesthesia    told to breathe twice after a morton's neuroma surgery 2003, did fine with other surgeries since  . GERD (gastroesophageal reflux disease)   . Headache   . History of kidney stones    passed on own  . Hypertension   . Medical history non-contributory   . Scoliosis   . Vitamin D deficiency 03/25/2016   Discharge Diagnoses:   Principal Problem:   OA (osteoarthritis) of knee  Estimated body mass index is 35.32 kg/m as calculated from the following:   Height as of this encounter: 5' 4.5" (1.638 m).   Weight as of this encounter: 94.8 kg (209 lb).  Procedure:  Procedure(s) (LRB): RIGHT TOTAL KNEE ARTHROPLASTY (Right)   Consults: None  HPI: Sarah Hicks is a 57 y.o. year old female with end stage OA of her right knee with progressively worsening pain and dysfunction. She has constant pain, with activity and at rest and significant functional deficits with difficulties even with ADLs. She has had extensive non-op management including analgesics, injections of cortisone and viscosupplements, and home exercise program, but remains in significant pain with significant dysfunction.Radiographs show bone on bone arthritis medial and patellofemoral. She presents now for right Total Knee Arthroplasty.     Laboratory Data: Admission on 10/25/2017  Component Date Value Ref Range Status  . WBC 10/26/2017 16.1* 4.0 - 10.5 K/uL Final  . RBC 10/26/2017 3.85* 3.87 - 5.11 MIL/uL Final  . Hemoglobin 10/26/2017 11.2* 12.0 - 15.0 g/dL Final  . HCT 10/26/2017 35.2* 36.0 - 46.0 % Final  . MCV 10/26/2017 91.4  78.0 - 100.0 fL Final  . MCH 10/26/2017 29.1  26.0 - 34.0 pg Final  .  MCHC 10/26/2017 31.8  30.0 - 36.0 g/dL Final  . RDW 10/26/2017 14.1  11.5 - 15.5 % Final  . Platelets 10/26/2017 245  150 - 400 K/uL Final  . Sodium 10/26/2017 140  135 - 145 mmol/L Final  . Potassium 10/26/2017 4.4  3.5 - 5.1 mmol/L Final  . Chloride 10/26/2017 107  101 - 111 mmol/L Final  . CO2 10/26/2017 25  22 - 32 mmol/L Final  . Glucose, Bld 10/26/2017 121* 65 - 99 mg/dL Final  . BUN 10/26/2017 9  6 - 20 mg/dL Final  . Creatinine, Ser 10/26/2017 0.82  0.44 - 1.00 mg/dL Final  . Calcium 10/26/2017 8.5* 8.9 - 10.3 mg/dL Final  . GFR calc non Af Amer 10/26/2017 >60  >60 mL/min Final  . GFR calc Af Amer 10/26/2017 >60  >60 mL/min Final   Comment: (NOTE) The eGFR has been calculated using the CKD EPI equation. This calculation has not been validated in all clinical situations. eGFR's persistently <60 mL/min signify possible Chronic Kidney Disease.   Georgiann Hahn gap 10/26/2017 8  5 - 15 Final  Hospital Outpatient Visit on 10/19/2017  Component Date Value Ref Range Status  . MRSA, PCR 10/19/2017 NEGATIVE  NEGATIVE Final  . Staphylococcus aureus 10/19/2017 NEGATIVE  NEGATIVE Final   Comment: (NOTE) The Xpert SA Assay (FDA approved for NASAL specimens in patients 21 years of age and older), is one component of a comprehensive surveillance program. It is not intended to diagnose infection  nor to guide or monitor treatment.   Marland Kitchen aPTT 10/19/2017 26  24 - 36 seconds Final  . WBC 10/19/2017 8.5  4.0 - 10.5 K/uL Final  . RBC 10/19/2017 4.79  3.87 - 5.11 MIL/uL Final  . Hemoglobin 10/19/2017 14.0  12.0 - 15.0 g/dL Final  . HCT 10/19/2017 43.4  36.0 - 46.0 % Final  . MCV 10/19/2017 90.6  78.0 - 100.0 fL Final  . MCH 10/19/2017 29.2  26.0 - 34.0 pg Final  . MCHC 10/19/2017 32.3  30.0 - 36.0 g/dL Final  . RDW 10/19/2017 13.6  11.5 - 15.5 % Final  . Platelets 10/19/2017 257  150 - 400 K/uL Final  . Sodium 10/19/2017 141  135 - 145 mmol/L Final  . Potassium 10/19/2017 4.5  3.5 - 5.1 mmol/L Final   . Chloride 10/19/2017 107  101 - 111 mmol/L Final  . CO2 10/19/2017 29  22 - 32 mmol/L Final  . Glucose, Bld 10/19/2017 103* 65 - 99 mg/dL Final  . BUN 10/19/2017 13  6 - 20 mg/dL Final  . Creatinine, Ser 10/19/2017 0.84  0.44 - 1.00 mg/dL Final  . Calcium 10/19/2017 9.0  8.9 - 10.3 mg/dL Final  . Total Protein 10/19/2017 7.6  6.5 - 8.1 g/dL Final  . Albumin 10/19/2017 4.1  3.5 - 5.0 g/dL Final  . AST 10/19/2017 33  15 - 41 U/L Final  . ALT 10/19/2017 49  14 - 54 U/L Final  . Alkaline Phosphatase 10/19/2017 99  38 - 126 U/L Final  . Total Bilirubin 10/19/2017 0.6  0.3 - 1.2 mg/dL Final  . GFR calc non Af Amer 10/19/2017 >60  >60 mL/min Final  . GFR calc Af Amer 10/19/2017 >60  >60 mL/min Final   Comment: (NOTE) The eGFR has been calculated using the CKD EPI equation. This calculation has not been validated in all clinical situations. eGFR's persistently <60 mL/min signify possible Chronic Kidney Disease.   . Anion gap 10/19/2017 5  5 - 15 Final  . Prothrombin Time 10/19/2017 12.5  11.4 - 15.2 seconds Final  . INR 10/19/2017 0.94   Final  . ABO/RH(D) 10/19/2017 A POS   Final  . Antibody Screen 10/19/2017 NEG   Final  . Sample Expiration 10/19/2017 10/28/2017   Final  . Extend sample reason 10/19/2017 NO TRANSFUSIONS OR PREGNANCY IN THE PAST 3 MONTHS   Final  . ABO/RH(D) 10/19/2017 A POS   Final     X-Rays:Dg Chest 2 View  Result Date: 10/20/2017 CLINICAL DATA:  Preop right knee replacement EXAM: CHEST  2 VIEW COMPARISON:  10/25/2013 FINDINGS: Heart and mediastinal contours are within normal limits. No focal opacities or effusions. No acute bony abnormality. IMPRESSION: No active cardiopulmonary disease. Electronically Signed   By: Rolm Baptise M.D.   On: 10/20/2017 08:27    EKG:No orders found for this or any previous visit.   Hospital Course: Sarah Hicks is a 57 y.o. who was admitted to [facility]. They were brought to the operating room on 10/25/2017 and underwent  Procedure(s): RIGHT TOTAL KNEE ARTHROPLASTY.  Patient tolerated the procedure well and was later transferred to the recovery room and then to the orthopaedic floor for postoperative care.  They were given PO and IV analgesics for pain control following their surgery.  They were given 24 hours of postoperative antibiotics of  Anti-infectives (From admission, onward)   Start     Dose/Rate Route Frequency Ordered Stop   10/25/17 1330  ceFAZolin (ANCEF) IVPB 2g/100 mL premix     2 g 200 mL/hr over 30 Minutes Intravenous Every 6 hours 10/25/17 1013 10/25/17 1956   10/25/17 0606  ceFAZolin (ANCEF) IVPB 2g/100 mL premix     2 g 200 mL/hr over 30 Minutes Intravenous On call to O.R. 10/25/17 5784 10/25/17 0758     and started on DVT prophylaxis in the form of Xarelto.   PT and OT were ordered for total joint protocol.  Discharge planning consulted to help with postop disposition and equipment needs.  Patient had a rough night on the evening of surgery with pain.  They started to get up OOB with therapy on day one. Hemovac drain was pulled without difficulty.  Continued to work with therapy into day two.  Dressing was changed on day two and the incision was healing well. Patient was seen in rounds on day two and was ready to go home. Please note that UA was positive for HGB.  Did not show obvs signs of infection.  Probable traumatic foley cath and should resolve. Patient ready for discharge and recommended to follow up with her PCP or Urology on an outpatient basis for recheck of the UA.  Diet - Cardiac diet Follow up - in 2 weeks Activity - WBAT Disposition - Home Condition Upon Discharge - Stable D/C Meds - See DC Summary DVT Prophylaxis - Xarelto  Discharge Instructions    Call MD / Call 911   Complete by:  As directed    If you experience chest pain or shortness of breath, CALL 911 and be transported to the hospital emergency room.  If you develope a fever above 101 F, pus (white drainage) or  increased drainage or redness at the wound, or calf pain, call your surgeon's office.   Change dressing   Complete by:  As directed    Change dressing daily with sterile 4 x 4 inch gauze dressing and apply TED hose. Do not submerge the incision under water.   Constipation Prevention   Complete by:  As directed    Drink plenty of fluids.  Prune juice may be helpful.  You may use a stool softener, such as Colace (over the counter) 100 mg twice a day.  Use MiraLax (over the counter) for constipation as needed.   Diet general   Complete by:  As directed    Discharge instructions   Complete by:  As directed    Take Xarelto for two and a half more weeks, then discontinue Xarelto. Once the patient has completed the blood thinner regimen, then take a Baby 81 mg Aspirin daily for three more weeks.   Pick up stool softner and laxative for home use following surgery while on pain medications. Do not submerge incision under water. Please use good hand washing techniques while changing dressing each day. May shower starting three days after surgery. Please use a clean towel to pat the incision dry following showers. Continue to use ice for pain and swelling after surgery. Do not use any lotions or creams on the incision until instructed by your surgeon.  Wear both TED hose on both legs during the day every day for three weeks, but may remove the TED hose at night at home.  Postoperative Constipation Protocol  Constipation - defined medically as fewer than three stools per week and severe constipation as less than one stool per week.  One of the most common issues patients have following surgery is constipation.  Even if  you have a regular bowel pattern at home, your normal regimen is likely to be disrupted due to multiple reasons following surgery.  Combination of anesthesia, postoperative narcotics, change in appetite and fluid intake all can affect your bowels.  In order to avoid complications  following surgery, here are some recommendations in order to help you during your recovery period.  Colace (docusate) - Pick up an over-the-counter form of Colace or another stool softener and take twice a day as long as you are requiring postoperative pain medications.  Take with a full glass of water daily.  If you experience loose stools or diarrhea, hold the colace until you stool forms back up.  If your symptoms do not get better within 1 week or if they get worse, check with your doctor.  Dulcolax (bisacodyl) - Pick up over-the-counter and take as directed by the product packaging as needed to assist with the movement of your bowels.  Take with a full glass of water.  Use this product as needed if not relieved by Colace only.   MiraLax (polyethylene glycol) - Pick up over-the-counter to have on hand.  MiraLax is a solution that will increase the amount of water in your bowels to assist with bowel movements.  Take as directed and can mix with a glass of water, juice, soda, coffee, or tea.  Take if you go more than two days without a movement. Do not use MiraLax more than once per day. Call your doctor if you are still constipated or irregular after using this medication for 7 days in a row.  If you continue to have problems with postoperative constipation, please contact the office for further assistance and recommendations.  If you experience "the worst abdominal pain ever" or develop nausea or vomiting, please contact the office immediatly for further recommendations for treatment.   Do not put a pillow under the knee. Place it under the heel.   Complete by:  As directed    Do not sit on low chairs, stoools or toilet seats, as it may be difficult to get up from low surfaces   Complete by:  As directed    Driving restrictions   Complete by:  As directed    No driving until released by the physician.   Increase activity slowly as tolerated   Complete by:  As directed    Lifting restrictions    Complete by:  As directed    No lifting until released by the physician.   Patient may shower   Complete by:  As directed    You may shower without a dressing once there is no drainage.  Do not wash over the wound.  If drainage remains, do not shower until drainage stops.   TED hose   Complete by:  As directed    Use stockings (TED hose) for 3 weeks on both leg(s).  You may remove them at night for sleeping.   Weight bearing as tolerated   Complete by:  As directed    Laterality:  right   Extremity:  Lower     Allergies as of 10/26/2017      Reactions   Latex Rash      Medication List    STOP taking these medications   Cholecalciferol 5000 units capsule   ibuprofen 200 MG tablet Commonly known as:  ADVIL,MOTRIN   Probiotic Caps   vitamin C 500 MG tablet Commonly known as:  ASCORBIC ACID     TAKE these medications  acetaminophen 500 MG tablet Commonly known as:  TYLENOL Take 1,000 mg by mouth daily as needed for moderate pain or headache.   HYDROmorphone 2 MG tablet Commonly known as:  DILAUDID Take 1-2 tablets (2-4 mg total) by mouth every 4 (four) hours as needed for moderate pain.   loratadine 10 MG tablet Commonly known as:  CLARITIN Take 10 mg by mouth daily as needed for allergies.   LUMIFY 0.025 % Soln Generic drug:  Brimonidine Tartrate Apply 1 drop to eye daily as needed (redness).   methocarbamol 500 MG tablet Commonly known as:  ROBAXIN Take 1 tablet (500 mg total) by mouth every 6 (six) hours as needed for muscle spasms.   rivaroxaban 10 MG Tabs tablet Commonly known as:  XARELTO Take 1 tablet (10 mg total) by mouth daily with breakfast. Take Xarelto for two and a half more weeks following discharge from the hospital, then discontinue Xarelto. Once the patient has completed the blood thinner regimen, then take a Baby 81 mg Aspirin daily for three more weeks. Start taking on:  10/27/2017   traMADol 50 MG tablet Commonly known as:  ULTRAM Take  1-2 tablets (50-100 mg total) by mouth every 6 (six) hours as needed (mild pain).            Durable Medical Equipment  (From admission, onward)        Start     Ordered   10/26/17 1540  For home use only DME 3 n 1  Once     10/26/17 1540   10/26/17 1540  For home use only DME Walker rolling  Once    Question:  Patient needs a walker to treat with the following condition  Answer:  S/P knee surgery   10/26/17 1540       Discharge Care Instructions  (From admission, onward)        Start     Ordered   10/26/17 0000  Weight bearing as tolerated    Question Answer Comment  Laterality right   Extremity Lower      10/26/17 2036   10/26/17 0000  Change dressing    Comments:  Change dressing daily with sterile 4 x 4 inch gauze dressing and apply TED hose. Do not submerge the incision under water.   10/26/17 2036     Follow-up Information    Gaynelle Arabian, MD. Schedule an appointment as soon as possible for a visit on 11/09/2017.   Specialty:  Orthopedic Surgery Contact information: [address] [place] [address] [phone]           Signed: Arlee Muslim, PA-C Orthopaedic Surgery 10/26/2017, 8:37 PM

## 2017-10-27 LAB — BASIC METABOLIC PANEL
ANION GAP: 6 (ref 5–15)
BUN: 11 mg/dL (ref 6–20)
CHLORIDE: 106 mmol/L (ref 101–111)
CO2: 27 mmol/L (ref 22–32)
CREATININE: 0.66 mg/dL (ref 0.44–1.00)
Calcium: 8.7 mg/dL — ABNORMAL LOW (ref 8.9–10.3)
GFR calc Af Amer: >60 mL/min (ref >60)
GFR calc non Af Amer: >60 mL/min (ref >60)
Glucose, Bld: 116 mg/dL — ABNORMAL HIGH (ref 65–99)
POTASSIUM: 4.4 mmol/L (ref 3.5–5.1)
SODIUM: 139 mmol/L (ref 135–145)

## 2017-10-27 LAB — CBC
HEMATOCRIT: 35.3 % — AB (ref 36.0–46.0)
HEMOGLOBIN: 11.2 g/dL — AB (ref 12.0–15.0)
MCH: 29.2 pg (ref 26.0–34.0)
MCHC: 31.7 g/dL (ref 30.0–36.0)
MCV: 92.2 fL (ref 78.0–100.0)
Platelets: 246 10*3/uL (ref 150–400)
RBC: 3.83 MIL/uL — AB (ref 3.87–5.11)
RDW: 14.3 % (ref 11.5–15.5)
WBC: 16.7 10*3/uL — ABNORMAL HIGH (ref 4.0–10.5)

## 2017-10-27 LAB — URINALYSIS, ROUTINE W REFLEX MICROSCOPIC
BILIRUBIN URINE: NEGATIVE
GLUCOSE, UA: NEGATIVE mg/dL
Ketones, ur: NEGATIVE mg/dL
LEUKOCYTES UA: NEGATIVE
NITRITE: NEGATIVE
PROTEIN: NEGATIVE mg/dL
Specific Gravity, Urine: 1.013 (ref 1.005–1.030)
pH: 6 (ref 5.0–8.0)

## 2017-10-27 NOTE — Progress Notes (Signed)
Physical Therapy Treatment Patient Details Name: Sarah Hicks MRN: [id number] DOB: 17-Dec-1960 Today's Date: 10/27/2017    History of Present Illness Pt is a 57 yo female, s/p R TKA    PT Comments    POD # 2 pm session Had spouse apply KI under therapist direction and assist pt OOB to amb in hallway with 25% VC's on safety.  Assisted back to bed to complete TKR TE's following HEP handout.  Instructed on proper tech, freq as OP not yet to start till Friday.  Instructed on proper LE positioning and use of ICE. Pt ready for D/C to home.   Follow Up Recommendations  DC plan and follow up therapy as arranged by surgeon;Outpatient PT     Equipment Recommendations  Rolling walker with 5" wheels;3in1 (PT)    Recommendations for Other Services       Precautions / Restrictions Precautions Precautions: Fall;Knee Precaution Comments: instructed on KI use and proper application Required Braces or Orthoses: Knee Immobilizer - Right Knee Immobilizer - Right: On when out of bed or walking;Discontinue once straight leg raise with < 10 degree lag Restrictions Weight Bearing Restrictions: No Other Position/Activity Restrictions: WBAT    Mobility  Bed Mobility Overal bed mobility: Needs Assistance Bed Mobility: Supine to Sit;Sit to Supine     Supine to sit: Min assist Sit to supine: Min assist   General bed mobility comments: assist R LE and increased time  Transfers Overall transfer level: Needs assistance Equipment used: Rolling walker (2 wheeled) Transfers: Sit to/from Stand Sit to Stand: Min guard         General transfer comment: cues for UE placement. Pt asked husband to assist with leg during transitions  Ambulation/Gait Ambulation/Gait assistance: Min guard Ambulation Distance (Feet): 42 Feet Assistive device: Rolling walker (2 wheeled) Gait Pattern/deviations: Step-to pattern;Decreased weight shift to right;Decreased stance time - right;Antalgic Gait velocity:  decrease   General Gait Details: tolerated an increased distance with 25% VC's on proper walker toself distance and upright posture   Stairs     Balance                                            Cognition Arousal/Alertness: Awake/alert Behavior During Therapy: WFL for tasks assessed/performed Overall Cognitive Status: Within Functional Limits for tasks assessed                                        Exercises   Total Knee Replacement TE's  10 reps heel slides  10 reps SAQ's 10 reps SLR's 10 reps ABD Followed by ICE    General Comments        Pertinent Vitals/Pain Pain Assessment: 0-10 Pain Score: 8  Pain Location: R knee with activity Pain Descriptors / Indicators: Aching;Grimacing;Guarding;Operative site guarding Pain Intervention(s): Monitored during session;Repositioned;Ice applied;RN gave pain meds during session    Home Living                      Prior Function            PT Goals (current goals can now be found in the care plan section) Progress towards PT goals: Progressing toward goals    Frequency    7X/week      PT Plan Current  plan remains appropriate    Co-evaluation              AM-PAC PT "6 Clicks" Daily Activity  Outcome Measure  Difficulty turning over in bed (including adjusting bedclothes, sheets and blankets)?: A Lot Difficulty moving from lying on back to sitting on the side of the bed? : Unable Difficulty sitting down on and standing up from a chair with arms (e.g., wheelchair, bedside commode, etc,.)?: Unable Help needed moving to and from a bed to chair (including a wheelchair)?: A Little Help needed walking in hospital room?: A Little Help needed climbing 3-5 steps with a railing? : A Lot 6 Click Score: 12    End of Session Equipment Utilized During Treatment: Gait belt;Right knee immobilizer Activity Tolerance: Patient limited by fatigue;Patient limited by  pain Patient left: in bed;with call bell/phone within reach;with family/visitor present Nurse Communication: Mobility status(pt will nee another PT session prior to D/C) PT Visit Diagnosis: Difficulty in walking, not elsewhere classified (R26.2)     Time: 4270-6237 PT Time Calculation (min) (ACUTE ONLY): 34 min  Charges:  $Gait Training: 8-22 mins $Therapeutic Exercise: 8-22 mins                    G Codes:       {Walaa Carel  PTA [facility]  Acute  Rehab Pager      [phone]

## 2017-10-27 NOTE — Progress Notes (Signed)
   Subjective: 2 Days Post-Op Procedure(s) (LRB): RIGHT TOTAL KNEE ARTHROPLASTY (Right) Patient reports pain as mild.   Patient seen in rounds for Dr. Wynelle Link. Patient is well, but has had some minor complaints of pain in the knee, requiring pain medications Patient is ready to go home  Objective: Vital signs in last 24 hours: Temp:  [97.3 F (36.3 C)-98.6 F (37 C)] 98.6 F (37 C) (01/16 0604) Pulse Rate:  [65-77] 77 (01/16 0604) Resp:  [16-18] 18 (01/16 0604) BP: (114-145)/(64-81) 145/69 (01/16 0604) SpO2:  [92 %-96 %] 94 % (01/16 0604)  Intake/Output from previous day:  Intake/Output Summary (Last 24 hours) at 10/27/2017 0808 Last data filed at 10/27/2017 0605 Gross per 24 hour  Intake 1988.75 ml  Output 1650 ml  Net 338.75 ml    Intake/Output this shift: No intake/output data recorded.  Labs: Recent Labs    10/26/17 0606 10/27/17 0533  HGB 11.2* 11.2*   Recent Labs    10/26/17 0606 10/27/17 0533  WBC 16.1* 16.7*  RBC 3.85* 3.83*  HCT 35.2* 35.3*  PLT 245 246   Recent Labs    10/26/17 0606 10/27/17 0533  NA 140 139  K 4.4 4.4  CL 107 106  CO2 25 27  BUN 9 11  CREATININE 0.82 0.66  GLUCOSE 121* 116*  CALCIUM 8.5* 8.7*   No results for input(s): LABPT, INR in the last 72 hours.   UA - large Hgb Neg Nitrite Neg Leuk RBC - too numerous to count Rare Squamous   EXAM: General - Patient is Alert, Appropriate and Oriented Extremity - Neurovascular intact Sensation intact distally Intact pulses distally Dorsiflexion/Plantar flexion intact Incision - clean, dry, no drainage Motor Function - intact, moving foot and toes well on exam.   Assessment/Plan: 2 Days Post-Op Procedure(s) (LRB): RIGHT TOTAL KNEE ARTHROPLASTY (Right) Procedure(s) (LRB): RIGHT TOTAL KNEE ARTHROPLASTY (Right) Past Medical History:  Diagnosis Date  . Arthritis    oa  . Complication of anesthesia    told to breathe twice after a morton's neuroma surgery 2003, did fine  with other surgeries since  . GERD (gastroesophageal reflux disease)   . Headache   . History of kidney stones    passed on own  . Hypertension   . Medical history non-contributory   . Scoliosis   . Vitamin D deficiency 03/25/2016   Principal Problem:   OA (osteoarthritis) of knee  Estimated body mass index is 35.32 kg/m as calculated from the following:   Height as of this encounter: 5' 4.5" (1.638 m).   Weight as of this encounter: 94.8 kg (209 lb). Up with therapy Diet - Cardiac diet Follow up - in 2 weeks Activity - WBAT Disposition - Home Condition Upon Discharge - Stable D/C Meds - See DC Summary DVT Prophylaxis - Xarelto  Arlee Muslim, PA-C Orthopaedic Surgery 10/27/2017, 8:08 AM

## 2017-10-27 NOTE — Plan of Care (Signed)
Plan of care reviewed and discussed with patient and husband.  Home PT and follow-up visits discussed.

## 2017-10-27 NOTE — Progress Notes (Signed)
Occupational Therapy Treatment Patient Details Name: FAUNA NEUNER MRN: [id number] DOB: 18-Jan-1961 Today's Date: 10/27/2017    History of present illness Pt is a 57 yo female, s/p R TKA   OT comments  All education completed this session. Pt unable to step over shower ledge due to pain. She will sponge bathe initially.  Shower sequence handout given  Follow Up Recommendations  Supervision/Assistance - 24 hour    Equipment Recommendations  3 in 1 bedside commode    Recommendations for Other Services      Precautions / Restrictions Precautions Precautions: Fall;Knee Required Braces or Orthoses: Knee Immobilizer - Right Knee Immobilizer - Right: On when out of bed or walking;Discontinue once straight leg raise with < 10 degree lag Restrictions Other Position/Activity Restrictions: WBAT       Mobility Bed Mobility               General bed mobility comments: oob  Transfers   Equipment used: Rolling walker (2 wheeled)   Sit to Stand: Min guard         General transfer comment: cues for UE placement. Pt asked husband to assist with leg during transitions    Balance                                           ADL either performed or assessed with clinical judgement   ADL                           Toilet Transfer: Min guard;Ambulation;BSC;RW   Toileting- Water quality scientist and Hygiene: Min guard;Sit to/from stand         General ADL Comments: attempted shower transfer:  pt unable to tolerate increased weight on RLE.  Simulated small ledge but still unable. She will sponge bathe initially.  She can stand at sink to wash hair.  Husband present. Pt places little weight on RLE especially during turns. Cued to put some weight through leg.  Pt/husband with several questions.  He supported her leg during sit to stand and observed KI     Vision       Perception     Praxis      Cognition Arousal/Alertness:  Awake/alert Behavior During Therapy: WFL for tasks assessed/performed Overall Cognitive Status: Within Functional Limits for tasks assessed                                          Exercises     Shoulder Instructions       General Comments      Pertinent Vitals/ Pain       Pain Score: 8  Pain Location: r KNEE WITH WEIGHT BEARING Pain Descriptors / Indicators: Aching Pain Intervention(s): Limited activity within patient's tolerance;Monitored during session;Premedicated before session;Repositioned;Ice applied  Home Living                                          Prior Functioning/Environment              Frequency           Progress Toward Goals  OT Goals(current goals can now be  found in the care plan section)  Progress towards OT goals: Progressing toward goals     Plan      Co-evaluation                 AM-PAC PT "6 Clicks" Daily Activity     Outcome Measure   Help from another person eating meals?: None Help from another person taking care of personal grooming?: A Little Help from another person toileting, which includes using toliet, bedpan, or urinal?: A Little Help from another person bathing (including washing, rinsing, drying)?: A Little Help from another person to put on and taking off regular upper body clothing?: A Little Help from another person to put on and taking off regular lower body clothing?: A Lot 6 Click Score: 18    End of Session    OT Visit Diagnosis: Pain Pain - Right/Left: Right Pain - part of body: Knee   Activity Tolerance Patient tolerated treatment well   Patient Left in chair;with call bell/phone within reach;with family/visitor present   Nurse Communication          Time: 0753-0832(Drew present 10 min) OT Time Calculation (min): 39 min  Charges: OT General Charges $OT Visit: 1 Visit OT Treatments $Self Care/Home Management : 23-37 mins  Lesle Chris,  OTR/L [phone] 10/27/2017   [place] 10/27/2017, 10:21 AM

## 2017-10-27 NOTE — Progress Notes (Signed)
Physical Therapy Treatment Patient Details Name: Sarah Hicks MRN: [id number] DOB: November 28, 1960 Today's Date: 10/27/2017    History of Present Illness Pt is a 57 yo female, s/p R TKA    PT Comments    POD # 2 am session Spouse present during session and educated on KI use and proper application.  "Hands on" instructed on safe handling with transfers and amb.  Practiced one step.  Pt unable to attempt step forward so performed backward approach.  Still difficult.  Returned to room to perform some TKR TE's limited by pain.   Pt will need another PT session prior to D/C to complete TE's as OP does not start till Friday.   Follow Up Recommendations  DC plan and follow up therapy as arranged by surgeon;Outpatient PT     Equipment Recommendations  Rolling walker with 5" wheels;3in1 (PT)    Recommendations for Other Services       Precautions / Restrictions Precautions Precautions: Fall;Knee Precaution Comments: instructed on KI use and proper application Required Braces or Orthoses: Knee Immobilizer - Right Knee Immobilizer - Right: On when out of bed or walking;Discontinue once straight leg raise with < 10 degree lag Restrictions Weight Bearing Restrictions: No Other Position/Activity Restrictions: WBAT    Mobility  Bed Mobility Overal bed mobility: Needs Assistance Bed Mobility: Supine to Sit;Sit to Supine     Supine to sit: Min assist Sit to supine: Min assist   General bed mobility comments: assist R LE and increased time  Transfers Overall transfer level: Needs assistance Equipment used: Rolling walker (2 wheeled) Transfers: Sit to/from Stand Sit to Stand: Min guard         General transfer comment: cues for UE placement. Pt asked husband to assist with leg during transitions  Ambulation/Gait Ambulation/Gait assistance: Min guard Ambulation Distance (Feet): 42 Feet Assistive device: Rolling walker (2 wheeled) Gait Pattern/deviations: Step-to  pattern;Decreased weight shift to right;Decreased stance time - right;Antalgic Gait velocity: decrease   General Gait Details: tolerated an increased distance with 25% VC's on proper walker toself distance and upright posture   Stairs Stairs: Yes   Stair Management: No rails;Step to pattern;Backwards;With walker Number of Stairs: 1 General stair comments: 50% VC's on proper tech and safety.  performed with spouse "hands on" instructed  Wheelchair Mobility    Modified Rankin (Stroke Patients Only)       Balance                                            Cognition Arousal/Alertness: Awake/alert Behavior During Therapy: WFL for tasks assessed/performed Overall Cognitive Status: Within Functional Limits for tasks assessed                                        Exercises   Total Knee Replacement TE's 10 reps B LE ankle pumps 10 reps towel squeezes 10 reps knee presses 05 reps heel slides   Followed by ICE     General Comments        Pertinent Vitals/Pain Pain Assessment: 0-10 Pain Score: 8  Pain Location: R knee with activity Pain Descriptors / Indicators: Aching;Grimacing;Guarding;Operative site guarding Pain Intervention(s): Monitored during session;Repositioned;Ice applied;RN gave pain meds during session    Home Living  Prior Function            PT Goals (current goals can now be found in the care plan section) Progress towards PT goals: Progressing toward goals    Frequency    7X/week      PT Plan Current plan remains appropriate    Co-evaluation              AM-PAC PT "6 Clicks" Daily Activity  Outcome Measure  Difficulty turning over in bed (including adjusting bedclothes, sheets and blankets)?: A Lot Difficulty moving from lying on back to sitting on the side of the bed? : Unable Difficulty sitting down on and standing up from a chair with arms (e.g., wheelchair,  bedside commode, etc,.)?: Unable Help needed moving to and from a bed to chair (including a wheelchair)?: A Little Help needed walking in hospital room?: A Little Help needed climbing 3-5 steps with a railing? : A Lot 6 Click Score: 12    End of Session Equipment Utilized During Treatment: Gait belt;Right knee immobilizer Activity Tolerance: Patient limited by fatigue;Patient limited by pain Patient left: in bed;with call bell/phone within reach;with family/visitor present Nurse Communication: Mobility status(pt will nee another PT session prior to D/C) PT Visit Diagnosis: Difficulty in walking, not elsewhere classified (R26.2)     Time: 0935-1000 PT Time Calculation (min) (ACUTE ONLY): 25 min  Charges:  $Gait Training: 8-22 mins $Therapeutic Exercise: 8-22 mins                    G Codes:       Rica Koyanagi  PTA [facility]  Acute  Rehab Pager      [phone]

## 2017-10-29 ENCOUNTER — Other Ambulatory Visit: Payer: Self-pay

## 2017-10-29 ENCOUNTER — Encounter: Payer: Self-pay | Admitting: Physical Therapy

## 2017-10-29 ENCOUNTER — Ambulatory Visit: Payer: 59 | Attending: Orthopedic Surgery | Admitting: Physical Therapy

## 2017-10-29 DIAGNOSIS — M25561 Pain in right knee: Secondary | ICD-10-CM | POA: Diagnosis not present

## 2017-10-29 DIAGNOSIS — R600 Localized edema: Secondary | ICD-10-CM | POA: Diagnosis present

## 2017-10-29 DIAGNOSIS — M25661 Stiffness of right knee, not elsewhere classified: Secondary | ICD-10-CM | POA: Insufficient documentation

## 2017-10-29 DIAGNOSIS — M6281 Muscle weakness (generalized): Secondary | ICD-10-CM | POA: Diagnosis present

## 2017-10-29 NOTE — Therapy (Signed)
[facility] [place], [place], [address] Phone: [phone]   Fax:  [phone]  Physical Therapy Evaluation  Patient Details  Name: Sarah Hicks MRN: [id number] Date of Birth: 1961-03-31 Referring Provider: Dr. Gaynelle Arabian   Encounter Date: 10/29/2017  PT End of Session - 10/29/17 1038    Visit Number  1    Number of Visits  12    Date for PT Re-Evaluation  11/26/17    PT Start Time  1038    PT Stop Time  1130    PT Time Calculation (min)  52 min       Past Medical History:  Diagnosis Date  . Arthritis    oa  . Complication of anesthesia    told to breathe twice after a morton's neuroma surgery 2003, did fine with other surgeries since  . GERD (gastroesophageal reflux disease)   . Headache   . History of kidney stones    passed on own  . Hypertension   . Medical history non-contributory   . Scoliosis   . Vitamin D deficiency 03/25/2016    Past Surgical History:  Procedure Laterality Date  . ANKLE SURGERY Right 10 year ago  . cervical cryoablation  1998  . CHOLECYSTECTOMY  17 years ago  . COLONOSCOPY N/A 09/13/2013   Procedure: COLONOSCOPY;  Surgeon: Rogene Houston, MD;  [facility]: [facility];  Service: Endoscopy;  Laterality: N/A;  730-moved to [address] to notify pt  . FOOT SURGERY     Morton's neuroma  . KNEE SURGERY Left 10 years ago   torn ligemant and tendon repair  . TOTAL KNEE ARTHROPLASTY Right 10/25/2017   Procedure: RIGHT TOTAL KNEE ARTHROPLASTY;  Surgeon: Gaynelle Arabian, MD;  Location: [facility];  Service: Orthopedics;  Laterality: Right;  Adductor Block  . TUBAL LIGATION  19 years ago    There were no vitals filed for this visit.   Subjective Assessment - 10/29/17 1244    Subjective  Patient had R TKA on 10/25/17. She is compliant with HEP with assist from her husband. She amb into clinic with RW.    Patient is accompained by:  Family member    Pertinent History  R ankle surgery, L knee  surgery    Patient Stated Goals  to get back to walking without pain    Currently in Pain?  Yes    Pain Score  5     Pain Location  Knee    Pain Orientation  Right    Pain Descriptors / Indicators  Aching    Pain Type  Surgical pain    Pain Radiating Towards  thigh/calf    Pain Onset  In the past 7 days    Pain Frequency  Constant    Aggravating Factors   bending    Pain Relieving Factors  ice, rest    Effect of Pain on Daily Activities  limited         [facility] PT Assessment - 10/29/17 0001      Assessment   Medical Diagnosis  S/P R TKA    Referring Provider  Dr. Gaynelle Arabian    Onset Date/Surgical Date  10/25/17    Next MD Visit  11/05/17    Prior Therapy  no      Precautions   Precautions  Knee      Restrictions   Weight Bearing Restrictions  No      Balance Screen   Has the patient  fallen in the past 6 months  Yes    How many times?  1    Has the patient had a decrease in activity level because of a fear of falling?   No    Is the patient reluctant to leave their home because of a fear of falling?   No      Home Film/video editor residence    Living Arrangements  Spouse/significant other    Available Help at Discharge  Family    Type of [place] to enter    Entrance Stairs-Number of Steps  2    [place]  One level    [place] - 2 wheels;Bedside commode      Prior Function   Level of Independence  Independent    Vocation  Unemployed      Observation/Other Assessments-Edema    Edema  Circumferential      Circumferential Edema   Circumferential - Right  55 cm with bandage    Circumferential - Left   48 cm      ROM / Strength   AROM / PROM / Strength  AROM;PROM;Strength      AROM   AROM Assessment Site  Knee    Right/Left Knee  Right    Right Knee Extension  -14    Right Knee Flexion  40      PROM   PROM Assessment Site  Knee    Right/Left Knee  Right     Right Knee Extension  -8    Right Knee Flexion  45      Strength   Overall Strength Comments  unable to perform SLR Ind, poor quad contraction, requires min A x 1 with bed mobility.      Palpation   Patella mobility  WNL but painful    Palpation comment  tender along lateral thigh, post/ant knee and calf      Ambulation/Gait   Ambulation/Gait  Yes    Ambulation/Gait Assistance  6: Modified independent (Device/Increase time)    Ambulation Distance (Feet)  40 Feet    Assistive device  Rolling walker    Gait Pattern  Step-to pattern;Decreased stance time - right;Decreased step length - right;Decreased step length - left;Decreased stride length;Decreased hip/knee flexion - right;Decreased dorsiflexion - right    Ambulation Surface  Level             Objective measurements completed on examination: See above findings.      [place] Adult PT Treatment/Exercise - 10/29/17 0001      Modalities   Modalities  Vasopneumatic      Vasopneumatic   Number Minutes Vasopneumatic   8 minutes stopped due to increased pain in knee    Vasopnuematic Location   Knee    Vasopneumatic Pressure  Low    Vasopneumatic Temperature   34             PT Education - 10/29/17 1252    Education provided  Yes    Education Details  HEP reviewed; educated on infection signs, importance of compliance with HEP    Person(s) Educated  Patient;Spouse    Methods  Explanation;Demonstration;Tactile cues;Verbal cues handout from hospital    Comprehension  Verbalized understanding       PT Short Term Goals - 10/29/17 1256      PT SHORT TERM GOAL #1  Title  I with HEP    Time  4    Period  Weeks    Status  New    Target Date  11/26/17      PT SHORT TERM GOAL #2   Title  Improved R knee ROM -5 to 100 degrees    Time  4    Period  Weeks    Status  New      PT SHORT TERM GOAL #3   Title  Patient to report decreased pain by 50% with ambulation    Time  4    Status  New      PT SHORT TERM  GOAL #4   Title  Patient able to perform bed mobility independently    Time  4    Period  Weeks    Status  New        PT Long Term Goals - 10/29/17 1258      PT LONG TERM GOAL #1   Title  I with advanced HEP    Time  8    Period  Weeks    Status  New    Target Date  12/24/17      PT LONG TERM GOAL #2   Title  Patient able to ambulate community distances safely without AD.    Time  8    Period  Weeks    Status  New      PT LONG TERM GOAL #3   Title  Patient to demo improved R knee ROM 0-120 deg to normalize gait.    Time  8    Period  Weeks    Status  New      PT LONG TERM GOAL #4   Title  Patient to demo 4+/5 RLE hip and knee strength to improve function.    Time  8    Period  Weeks    Status  New      PT LONG TERM GOAL #5   Title  Patient to perform ADLs with 2/10 pain or less in R knee.    Time  8    Period  Weeks    Status  New             Plan - 10/29/17 1251    Clinical Impression Statement  Patient presents for low complexity evaluation for R knee pain s/p R TKA on 10/25/17. She has limited ROM and strength as well as edema affecting ADLS. She amb with RW presently. She will benefit from PT to address these deficits    Clinical Presentation  Stable    Clinical Decision Making  Low    Rehab Potential  Excellent    PT Frequency  3x / week    PT Duration  4 weeks    PT Treatment/Interventions  ADLs/Self Care Home Management;Cryotherapy;Occupational psychologist;Therapeutic exercise;Manual techniques;Patient/family education;Neuromuscular re-education;Passive range of motion;Taping;Vasopneumatic Device    PT Next Visit Plan  DO FOTO; TKA protocol (level 1 exercises), pain and edema control.    PT Home Exercise Plan  ROM and strengthening from hospital    Consulted and Agree with Plan of Care  Patient;Family member/caregiver       Patient will benefit from skilled therapeutic intervention in order to improve the following  deficits and impairments:  Abnormal gait, Increased edema, Pain, Decreased range of motion, Impaired flexibility, Decreased strength  Visit Diagnosis: Acute pain of right knee - Plan: PT plan of care cert/re-cert  Stiffness of right  knee, not elsewhere classified - Plan: PT plan of care cert/re-cert  Localized edema - Plan: PT plan of care cert/re-cert  Muscle weakness (generalized) - Plan: PT plan of care cert/re-cert     Problem List Patient Active Problem List   Diagnosis Date Noted  . OA (osteoarthritis) of knee 10/25/2017  . Well woman exam with routine gynecological exam 06/16/2017  . Vitamin D deficiency 03/25/2016   Madelyn Flavors PT 10/29/2017, 1:05 PM  [facility] [address] [place], [place], [address] Phone: [phone]   Fax:  [phone]  Name: Sarah Hicks MRN: [id number] Date of Birth: 1961/09/03

## 2017-11-01 ENCOUNTER — Ambulatory Visit: Payer: 59 | Admitting: Physical Therapy

## 2017-11-01 ENCOUNTER — Encounter: Payer: Self-pay | Admitting: Physical Therapy

## 2017-11-01 DIAGNOSIS — M25561 Pain in right knee: Secondary | ICD-10-CM

## 2017-11-01 DIAGNOSIS — M25661 Stiffness of right knee, not elsewhere classified: Secondary | ICD-10-CM

## 2017-11-01 DIAGNOSIS — R600 Localized edema: Secondary | ICD-10-CM

## 2017-11-01 DIAGNOSIS — M6281 Muscle weakness (generalized): Secondary | ICD-10-CM

## 2017-11-01 NOTE — Therapy (Signed)
[facility] [place], [place], [address] Phone: [phone]   Fax:  [phone]  Physical Therapy Treatment  Patient Details  Name: Sarah Hicks MRN: [id number] Date of Birth: June 01, 1961 Referring Provider: Dr. Gaynelle Arabian   Encounter Date: 11/01/2017  PT End of Session - 11/01/17 1510    Visit Number  2    Number of Visits  12    Date for PT Re-Evaluation  11/26/17    PT Start Time  2355    PT Stop Time  1525    PT Time Calculation (min)  51 min    Equipment Utilized During Treatment  Right knee immobilizer    Activity Tolerance  Patient limited by fatigue;Patient limited by pain    Behavior During Therapy  [facility] for tasks assessed/performed       Past Medical History:  Diagnosis Date  . Arthritis    oa  . Complication of anesthesia    told to breathe twice after a morton's neuroma surgery 2003, did fine with other surgeries since  . GERD (gastroesophageal reflux disease)   . Headache   . History of kidney stones    passed on own  . Hypertension   . Medical history non-contributory   . Scoliosis   . Vitamin D deficiency 03/25/2016    Past Surgical History:  Procedure Laterality Date  . ANKLE SURGERY Right 10 year ago  . cervical cryoablation  1998  . CHOLECYSTECTOMY  17 years ago  . COLONOSCOPY N/A 09/13/2013   Procedure: COLONOSCOPY;  Surgeon: Rogene Houston, MD;  [facility];  Service: Endoscopy;  Laterality: N/A;  730-moved to [address] to notify pt  . FOOT SURGERY     Morton's neuroma  . KNEE SURGERY Left 10 years ago   torn ligemant and tendon repair  . TOTAL KNEE ARTHROPLASTY Right 10/25/2017   Procedure: RIGHT TOTAL KNEE ARTHROPLASTY;  Surgeon: Gaynelle Arabian, MD;  Location: [facility];  Service: Orthopedics;  Laterality: Right;  Adductor Block  . TUBAL LIGATION  19 years ago    There were no vitals filed for this visit.  Subjective Assessment - 11/01/17 1445    Subjective  Patient  arrived with knee imobilizer and increased discomfort in knee    Patient is accompained by:  Family member    Pertinent History  R ankle surgery, L knee surgery    Patient Stated Goals  to get back to walking without pain    Currently in Pain?  Yes    Pain Score  7     Pain Location  Knee    Pain Orientation  Right    Pain Descriptors / Indicators  Aching    Pain Type  Surgical pain    Pain Onset  In the past 7 days    Pain Frequency  Constant    Aggravating Factors   bending knee    Pain Relieving Factors  rest and meds         [facility] PT Assessment - 11/01/17 0001      AROM   AROM Assessment Site  Knee    Right/Left Knee  Right    Right Knee Extension  -10    Right Knee Flexion  46      PROM   PROM Assessment Site  Knee    Right/Left Knee  Right    Right Knee Extension  -7    Right Knee Flexion  50  [place] Adult PT Treatment/Exercise - 11/01/17 0001      Exercises   Exercises  Knee/Hip      Knee/Hip Exercises: Aerobic   Nustep  41min L1 ROM only      Vasopneumatic   Number Minutes Vasopneumatic   15 minutes    Vasopnuematic Location   Knee    Vasopneumatic Pressure  Low      Manual Therapy   Manual Therapy  Passive ROM    Passive ROM  gentle PROM for right knee in sitting then supine with slide board to help range, then ext overpressure gentle holds in supine               PT Short Term Goals - 10/29/17 1256      PT SHORT TERM GOAL #1   Title  I with HEP    Time  4    Period  Weeks    Status  New    Target Date  11/26/17      PT SHORT TERM GOAL #2   Title  Improved R knee ROM -5 to 100 degrees    Time  4    Period  Weeks    Status  New      PT SHORT TERM GOAL #3   Title  Patient to report decreased pain by 50% with ambulation    Time  4    Status  New      PT SHORT TERM GOAL #4   Title  Patient able to perform bed mobility independently    Time  4    Period  Weeks    Status  New        PT Long Term  Goals - 10/29/17 1258      PT LONG TERM GOAL #1   Title  I with advanced HEP    Time  8    Period  Weeks    Status  New    Target Date  12/24/17      PT LONG TERM GOAL #2   Title  Patient able to ambulate community distances safely without AD.    Time  8    Period  Weeks    Status  New      PT LONG TERM GOAL #3   Title  Patient to demo improved R knee ROM 0-120 deg to normalize gait.    Time  8    Period  Weeks    Status  New      PT LONG TERM GOAL #4   Title  Patient to demo 4+/5 RLE hip and knee strength to improve function.    Time  8    Period  Weeks    Status  New      PT LONG TERM GOAL #5   Title  Patient to perform ADLs with 2/10 pain or less in R knee.    Time  8    Period  Weeks    Status  New            Plan - 11/01/17 1515    Clinical Impression Statement  Patient tolerated treatment fair today. Patient was fatigue and may be from medication per reported and had increased pain with ROM. Educated patient and husband on self stretches and RICE method. Today patient improved ROM for both flexion and ext. Today focused on ROM only today. Goals ongoing. MD F/U on friday..    Rehab Potential  Excellent    PT Frequency  3x / week    PT Duration  4 weeks    PT Treatment/Interventions  ADLs/Self Care Home Management;Cryotherapy;Occupational psychologist;Therapeutic exercise;Manual techniques;Patient/family education;Neuromuscular re-education;Passive range of motion;Taping;Vasopneumatic Device    PT Next Visit Plan  TKA protocol (level 1 exercises), pain and edema control.    Consulted and Agree with Plan of Care  Patient       Patient will benefit from skilled therapeutic intervention in order to improve the following deficits and impairments:  Abnormal gait, Increased edema, Pain, Decreased range of motion, Impaired flexibility, Decreased strength  Visit Diagnosis: Stiffness of right knee, not elsewhere classified  Localized  edema  Acute pain of right knee  Muscle weakness (generalized)     Problem List Patient Active Problem List   Diagnosis Date Noted  . OA (osteoarthritis) of knee 10/25/2017  . Well woman exam with routine gynecological exam 06/16/2017  . Vitamin D deficiency 03/25/2016    Phillips Climes, PTA 11/01/2017, 3:30 PM  [facility] [address] [place], [place], [address] Phone: [phone]   Fax:  [phone]  Name: Sarah Hicks MRN: [id number] Date of Birth: 1961/06/17

## 2017-11-03 ENCOUNTER — Encounter: Payer: Self-pay | Admitting: Physical Therapy

## 2017-11-03 ENCOUNTER — Ambulatory Visit: Payer: 59 | Admitting: Physical Therapy

## 2017-11-03 DIAGNOSIS — M25661 Stiffness of right knee, not elsewhere classified: Secondary | ICD-10-CM

## 2017-11-03 DIAGNOSIS — M25561 Pain in right knee: Secondary | ICD-10-CM | POA: Diagnosis not present

## 2017-11-03 DIAGNOSIS — M6281 Muscle weakness (generalized): Secondary | ICD-10-CM

## 2017-11-03 DIAGNOSIS — R600 Localized edema: Secondary | ICD-10-CM

## 2017-11-03 NOTE — Therapy (Signed)
[facility] [place], [place], [address] Phone: [phone]   Fax:  [phone]  Physical Therapy Treatment  Patient Details  Name: Sarah Hicks MRN: [id number] Date of Birth: Oct 17, 1960 Referring Provider: Dr. Gaynelle Arabian   Encounter Date: 11/03/2017  PT End of Session - 11/03/17 1254    Visit Number  3    Number of Visits  12    Date for PT Re-Evaluation  11/26/17    PT Start Time  1030    PT Stop Time  1120    PT Time Calculation (min)  50 min    Equipment Utilized During Treatment  Right knee immobilizer    Activity Tolerance  Patient limited by pain    Behavior During Therapy  [facility] for tasks assessed/performed       Past Medical History:  Diagnosis Date  . Arthritis    oa  . Complication of anesthesia    told to breathe twice after a morton's neuroma surgery 2003, did fine with other surgeries since  . GERD (gastroesophageal reflux disease)   . Headache   . History of kidney stones    passed on own  . Hypertension   . Medical history non-contributory   . Scoliosis   . Vitamin D deficiency 03/25/2016    Past Surgical History:  Procedure Laterality Date  . ANKLE SURGERY Right 10 year ago  . cervical cryoablation  1998  . CHOLECYSTECTOMY  17 years ago  . COLONOSCOPY N/A 09/13/2013   Procedure: COLONOSCOPY;  Surgeon: Rogene Houston, MD;  [facility];  Service: Endoscopy;  Laterality: N/A;  730-moved to [address] Ann to notify pt  . FOOT SURGERY     Morton's neuroma  . KNEE SURGERY Left 10 years ago   torn ligemant and tendon repair  . TOTAL KNEE ARTHROPLASTY Right 10/25/2017   Procedure: RIGHT TOTAL KNEE ARTHROPLASTY;  Surgeon: Gaynelle Arabian, MD;  Location: [facility];  Service: Orthopedics;  Laterality: Right;  Adductor Block  . TUBAL LIGATION  19 years ago    There were no vitals filed for this visit.  Subjective Assessment - 11/03/17 1250    Subjective  Patient arrived with knee imobilizer  and increased pain and swelling in R knee    Patient is accompained by:  Family member husband    Pertinent History  R ankle surgery, L knee surgery    Patient Stated Goals  to get back to walking without pain    Currently in Pain?  Yes    Pain Score  8     Pain Location  Knee    Pain Orientation  Right    Pain Descriptors / Indicators  Aching;Throbbing;Tightness;Sore    Pain Onset  1 to 4 weeks ago    Pain Frequency  Constant    Aggravating Factors   bending the knee    Pain Relieving Factors  resting, pain meds    Effect of Pain on Daily Activities  pt requires assistance with bed mobility, supine to sit, sit to supine, unable to perform household chores and ADL's independently, currently using a RW.     Multiple Pain Sites  No         [facility] PT Assessment - 11/03/17 0001      Assessment   Medical Diagnosis  s/p R TKA    Referring Provider  Dr. Gaynelle Arabian    Onset Date/Surgical Date  10/25/17    Next MD Visit  11/05/17  Prior Therapy  no      Precautions   Precautions  --      Restrictions   Weight Bearing Restrictions  No      Balance Screen   Has the patient fallen in the past 6 months  -- no falls since surgery      Circumferential Edema   Circumferential - Right  54 centimeters with bandage      AROM   AROM Assessment Site  Knee    Right/Left Knee  Right    Right Knee Extension  -10    Right Knee Flexion  45      PROM   PROM Assessment Site  Knee    Right/Left Knee  Right    Right Knee Extension  8    Right Knee Flexion  50      Ambulation/Gait   Gait Comments  Pt amb with antalgic gait with RW with decreased step length and decreased stance phase on the R LE                  [facility] Adult PT Treatment/Exercise - 11/03/17 0001      Exercises   Exercises  Knee/Hip      Knee/Hip Exercises: Aerobic   Nustep  L3-L5 x 10 minutes seat at 11      Knee/Hip Exercises: Supine   Quad Sets  AROM;Strengthening;Right;15 reps;Limitations    Quad  Sets Limitations  holding 5 seconds    Short Arc Quad Sets  AROM;Right;15 reps    Heel Slides  AAROM;15 reps      Vasopneumatic   Number Minutes Vasopneumatic   15 minutes    Vasopnuematic Location   Knee    Vasopneumatic Pressure  Low    Vasopneumatic Temperature   34      Manual Therapy   Manual Therapy  Passive ROM    Passive ROM  gentle PROM for right knee in sitting then supine with slide board to help range, then ext overpressure gentle holds in supine             PT Education - 11/03/17 1252    Education provided  Yes    Education Details  Reviewed Quad sets, Heel slides, LAQ, seated heel knee flexion, Discussed importance of Ice and Elevation and mobility including walking. Also discouraged pt's thoughts on getting a lift chair. Stressed importance of strengthening and ROM to return to PLOF.     Person(s) Educated  Patient;Spouse    Methods  Explanation;Demonstration    Comprehension  Verbalized understanding       PT Short Term Goals - 11/03/17 1408      PT SHORT TERM GOAL #1   Title  I with HEP    Status  On-going      PT SHORT TERM GOAL #2   Title  Improved R knee ROM -5 to 100 degrees    Status  On-going      PT SHORT TERM GOAL #3   Title  Patient to report decreased pain by 50% with ambulation    Status  On-going      PT SHORT TERM GOAL #4   Title  Patient able to perform bed mobility independently    Status  On-going        PT Long Term Goals - 11/03/17 1407      PT LONG TERM GOAL #1   Title  I with advanced HEP    Status  On-going  PT LONG TERM GOAL #2   Title  Patient able to ambulate community distances safely without AD.    Status  On-going      PT LONG TERM GOAL #3   Title  Patient to demo improved R knee ROM 0-120 deg to normalize gait.    Status  On-going      PT LONG TERM GOAL #4   Title  Patient to demo 4+/5 RLE hip and knee strength to improve function.    Status  On-going      PT LONG TERM GOAL #5   Title  Patient to  perform ADLs with 2/10 pain or less in R knee.    Status  On-going            Plan - 11/03/17 1257    Clinical Impression Statement  Pt arriving to therapy still in a right knee immobilizer for support due to weakness in her R quad and inability to perform a straight leg raise. Pt with limitd ROM 8-50 degrees in her R knee. Pt amb with a RW with antalgic gait pattern. Pt reviwed HEP and encouraged walking and ice/elevation. Continue skilled PT to progres toward pt's LTG's.     Clinical Presentation  Stable    Rehab Potential  Excellent    PT Frequency  3x / week    PT Duration  4 weeks    PT Treatment/Interventions  ADLs/Self Care Home Management;Cryotherapy;Occupational psychologist;Therapeutic exercise;Manual techniques;Patient/family education;Neuromuscular re-education;Passive range of motion;Taping;Vasopneumatic Device    PT Next Visit Plan  TKA protocol (level 1 exercises), pain and edema control.    PT Home Exercise Plan  ROM and strengthening from hospital    Consulted and Agree with Plan of Care  Patient       Patient will benefit from skilled therapeutic intervention in order to improve the following deficits and impairments:  Abnormal gait, Increased edema, Pain, Decreased range of motion, Impaired flexibility, Decreased strength  Visit Diagnosis: Stiffness of right knee, not elsewhere classified  Localized edema  Acute pain of right knee  Muscle weakness (generalized)     Problem List Patient Active Problem List   Diagnosis Date Noted  . OA (osteoarthritis) of knee 10/25/2017  . Well woman exam with routine gynecological exam 06/16/2017  . Vitamin D deficiency 03/25/2016   Kearney Hard, PT 11/03/17 2:10 PM    11/03/2017, 2:09 PM  [facility] [address] [place], [place], [address] Phone: [phone]   Fax:  [phone]  Name: Sarah Hicks MRN: [id number] Date of Birth:  [birth year]

## 2017-11-04 ENCOUNTER — Ambulatory Visit: Payer: 59 | Admitting: Physical Therapy

## 2017-11-04 ENCOUNTER — Encounter: Payer: Self-pay | Admitting: Physical Therapy

## 2017-11-04 DIAGNOSIS — M6281 Muscle weakness (generalized): Secondary | ICD-10-CM

## 2017-11-04 DIAGNOSIS — M25661 Stiffness of right knee, not elsewhere classified: Secondary | ICD-10-CM

## 2017-11-04 DIAGNOSIS — R600 Localized edema: Secondary | ICD-10-CM

## 2017-11-04 DIAGNOSIS — M25561 Pain in right knee: Secondary | ICD-10-CM | POA: Diagnosis not present

## 2017-11-04 NOTE — Therapy (Signed)
[facility] [place], [place], [address] Phone: [phone]   Fax:  [phone]  Physical Therapy Treatment  Patient Details  Name: Sarah Hicks MRN: [id number] Date of Birth: 1961/08/12 Referring Provider: Dr. Gaynelle Arabian   Encounter Date: 11/04/2017  PT End of Session - 11/04/17 1424    Visit Number  4    Number of Visits  12    Date for PT Re-Evaluation  11/26/17    PT Start Time  1030    PT Stop Time  1125    PT Time Calculation (min)  55 min    Equipment Utilized During Treatment  Right knee immobilizer    Activity Tolerance  Patient limited by pain    Behavior During Therapy  [facility] for tasks assessed/performed       Past Medical History:  Diagnosis Date  . Arthritis    oa  . Complication of anesthesia    told to breathe twice after a morton's neuroma surgery 2003, did fine with other surgeries since  . GERD (gastroesophageal reflux disease)   . Headache   . History of kidney stones    passed on own  . Hypertension   . Medical history non-contributory   . Scoliosis   . Vitamin D deficiency 03/25/2016    Past Surgical History:  Procedure Laterality Date  . ANKLE SURGERY Right 10 year ago  . cervical cryoablation  1998  . CHOLECYSTECTOMY  17 years ago  . COLONOSCOPY N/A 09/13/2013   Procedure: COLONOSCOPY;  Surgeon: Rogene Houston, MD;  [facility];  Service: Endoscopy;  Laterality: N/A;  730-moved to [address] to notify pt  . FOOT SURGERY     Morton's neuroma  . KNEE SURGERY Left 10 years ago   torn ligemant and tendon repair  . TOTAL KNEE ARTHROPLASTY Right 10/25/2017   Procedure: RIGHT TOTAL KNEE ARTHROPLASTY;  Surgeon: Gaynelle Arabian, MD;  [facility];  Service: Orthopedics;  Laterality: Right;  Adductor Block  . TUBAL LIGATION  19 years ago    There were no vitals filed for this visit.  Subjective Assessment - 11/04/17 1421    Subjective  Patient arrived with knee imobilizer  and increased pain and swelling in R knee    Patient is accompained by:  Family member    Pertinent History  R ankle surgery, L knee surgery    Patient Stated Goals  to get back to walking without pain    Currently in Pain?  Yes    Pain Score  8     Pain Location  Knee    Pain Orientation  Right    Pain Descriptors / Indicators  Aching;Throbbing;Tightness;Discomfort    Pain Type  Surgical pain    Pain Onset  1 to 4 weeks ago    Pain Frequency  Constant    Aggravating Factors   bending knee    Pain Relieving Factors  resting    Multiple Pain Sites  No         [facility] PT Assessment - 11/04/17 0001      Assessment   Medical Diagnosis  s/p R TKA    Onset Date/Surgical Date  10/25/17    Next MD Visit  11/05/17      Restrictions   Weight Bearing Restrictions  No      Circumferential Edema   Circumferential - Right  54 centimeters with bandage      PROM   PROM Assessment Site  Knee    Right/Left Knee  Right    Right Knee Extension  10    Right Knee Flexion  46                  [facility] Adult PT Treatment/Exercise - 11/04/17 0001      Knee/Hip Exercises: Supine   Quad Sets  AROM;Strengthening;Right;15 reps;Limitations    Quad Sets Limitations  holding 5 seconds    Short Arc Quad Sets  AROM;Right;15 reps    Heel Slides  AAROM;15 reps      Modalities   Modalities  Psychologist, prison and probation services Action  VMS    Electrical Stimulation Parameters  300pps, [id number], unable to elicit a contraction this visit. Attempted several pad placements     Electrical Stimulation Goals  Neuromuscular facilitation      Vasopneumatic   Number Minutes Vasopneumatic   15 minutes    Vasopnuematic Location   Knee    Vasopneumatic Pressure  Low    Vasopneumatic Temperature   34      Manual Therapy   Manual Therapy  Passive ROM    Manual therapy comments  Spider Tapping using Rock Tape  to help with Edema control    Passive ROM  gentle PROM for right knee in sitting then supine with slide board to help range, then ext overpressure gentle holds in supine             PT Education - 11/04/17 1422    Education provided  Yes    Education Details  instructed pt to continue to use her knee immobilizer until she was able to perform SAQ/SLR for safety, sent handwritten note with pt and husband to give to Dr. Brantley Fling) Educated  Patient;Spouse    Methods  Explanation    Comprehension  Verbalized understanding       PT Short Term Goals - 11/03/17 1408      PT SHORT TERM GOAL #1   Title  I with HEP    Status  On-going      PT SHORT TERM GOAL #2   Title  Improved R knee ROM -5 to 100 degrees    Status  On-going      PT SHORT TERM GOAL #3   Title  Patient to report decreased pain by 50% with ambulation    Status  On-going      PT SHORT TERM GOAL #4   Title  Patient able to perform bed mobility independently    Status  On-going        PT Long Term Goals - 11/03/17 1407      PT LONG TERM GOAL #1   Title  I with advanced HEP    Status  On-going      PT LONG TERM GOAL #2   Title  Patient able to ambulate community distances safely without AD.    Status  On-going      PT LONG TERM GOAL #3   Title  Patient to demo improved R knee ROM 0-120 deg to normalize gait.    Status  On-going      PT LONG TERM GOAL #4   Title  Patient to demo 4+/5 RLE hip and knee strength to improve function.    Status  On-going      PT LONG TERM GOAL #5   Title  Patient  to perform ADLs with 2/10 pain or less in R knee.    Status  On-going            Plan - 11/04/17 1424    Clinical Impression Statement  Pt arriving to therpay reporting 8/10 right knee pain. Pt using immobilizer for amb and her RW. PT unable to perform a SAQ. Attempted VSM to elicit a quad contraction but pt was unable to tolerate the Stimulation. Spider taping was perfomred and pt and husband were  edu on proper removal. Pt was edu in step through gait pattern using her RW. Pt's tolerance to exercises were limited by pain. Cotninue skilled PT.     Rehab Potential  Excellent    PT Frequency  3x / week    PT Duration  4 weeks    PT Treatment/Interventions  ADLs/Self Care Home Management;Cryotherapy;Occupational psychologist;Therapeutic exercise;Manual techniques;Patient/family education;Neuromuscular re-education;Passive range of motion;Taping;Vasopneumatic Device    PT Next Visit Plan  TKA protocol (level 1 exercises), pain and edema control.    PT Home Exercise Plan  ROM and strengthening from hospital       Patient will benefit from skilled therapeutic intervention in order to improve the following deficits and impairments:  Abnormal gait, Increased edema, Pain, Decreased range of motion, Impaired flexibility, Decreased strength  Visit Diagnosis: Stiffness of right knee, not elsewhere classified  Localized edema  Acute pain of right knee  Muscle weakness (generalized)     Problem List Patient Active Problem List   Diagnosis Date Noted  . OA (osteoarthritis) of knee 10/25/2017  . Well woman exam with routine gynecological exam 06/16/2017  . Vitamin D deficiency 03/25/2016    Oretha Caprice, PT 11/04/2017, 2:33 PM  [facility] [place], [place], [address] Phone: [phone]   Fax:  [phone]  Name: Sarah Hicks MRN: [id number] Date of Birth: 1961-04-16

## 2017-11-08 ENCOUNTER — Ambulatory Visit: Payer: 59 | Admitting: Physical Therapy

## 2017-11-08 ENCOUNTER — Encounter: Payer: Self-pay | Admitting: Physical Therapy

## 2017-11-08 DIAGNOSIS — M25561 Pain in right knee: Secondary | ICD-10-CM

## 2017-11-08 DIAGNOSIS — M25661 Stiffness of right knee, not elsewhere classified: Secondary | ICD-10-CM

## 2017-11-08 DIAGNOSIS — R600 Localized edema: Secondary | ICD-10-CM

## 2017-11-08 DIAGNOSIS — M6281 Muscle weakness (generalized): Secondary | ICD-10-CM

## 2017-11-08 NOTE — Therapy (Signed)
[facility] [place], [place], [address] Phone: [phone]   Fax:  [phone]  Physical Therapy Treatment  Patient Details  Name: Sarah Hicks MRN: [id number] Date of Birth: [birth year] Referring Provider: Dr. Gaynelle Arabian   Encounter Date: 11/08/2017  PT End of Session - 11/08/17 1209    Visit Number  5    Number of Visits  12    Date for PT Re-Evaluation  11/26/17    PT Start Time  1114    PT Stop Time  1223    PT Time Calculation (min)  69 min    Equipment Utilized During Treatment  Right knee immobilizer    Activity Tolerance  Patient tolerated treatment well    Behavior During Therapy  [facility] for tasks assessed/performed       Past Medical History:  Diagnosis Date  . Arthritis    oa  . Complication of anesthesia    told to breathe twice after a morton's neuroma surgery 2003, did fine with other surgeries since  . GERD (gastroesophageal reflux disease)   . Headache   . History of kidney stones    passed on own  . Hypertension   . Medical history non-contributory   . Scoliosis   . Vitamin D deficiency 03/25/2016    Past Surgical History:  Procedure Laterality Date  . ANKLE SURGERY Right 10 year ago  . cervical cryoablation  1998  . CHOLECYSTECTOMY  17 years ago  . COLONOSCOPY N/A 09/13/2013   Procedure: COLONOSCOPY;  Surgeon: Rogene Houston, MD;  [facility];  Service: Endoscopy;  Laterality: N/A;  730-moved to [address] Ann to notify pt  . FOOT SURGERY     Morton's neuroma  . KNEE SURGERY Left 10 years ago   torn ligemant and tendon repair  . TOTAL KNEE ARTHROPLASTY Right 10/25/2017   Procedure: RIGHT TOTAL KNEE ARTHROPLASTY;  Surgeon: Gaynelle Arabian, MD;  [facility];  Service: Orthopedics;  Laterality: Right;  Adductor Block  . TUBAL LIGATION  19 years ago    There were no vitals filed for this visit.  Subjective Assessment - 11/08/17 1124    Subjective  Patient went to MD and he  wants patient to improve range due to lack of range, and cont with knee imobilizer    Patient is accompained by:  Family member    Pertinent History  R ankle surgery, L knee surgery    Patient Stated Goals  to get back to walking without pain    Currently in Pain?  Yes    Pain Score  6     Pain Location  Knee    Pain Orientation  Right    Pain Descriptors / Indicators  Aching;Throbbing;Tightness    Pain Type  Surgical pain    Pain Onset  1 to 4 weeks ago    Pain Frequency  Constant    Aggravating Factors   bening knee    Pain Relieving Factors  rest         [facility] PT Assessment - 11/08/17 0001      AROM   AROM Assessment Site  Knee    Right/Left Knee  Right    Right Knee Extension  -10    Right Knee Flexion  54      PROM   PROM Assessment Site  Knee    Right/Left Knee  Right    Right Knee Extension  -6    Right Knee Flexion  36                  [facility] Adult PT Treatment/Exercise - 11/08/17 0001      Knee/Hip Exercises: Supine   Terminal Knee Extension  Strengthening;Right;2 sets;5 sets;Limitations    Terminal Knee Extension Limitations  manual assist for eccentric hold and lower      Electrical Stimulation   Electrical Stimulation Location  R Wellsite geologist Action  VMS    Electrical Stimulation Parameters  10/10 x59min with quad sets for activation    Electrical Stimulation Goals  Neuromuscular facilitation      Vasopneumatic   Number Minutes Vasopneumatic   15 minutes    Vasopnuematic Location   Knee    Vasopneumatic Pressure  Low      Manual Therapy   Manual Therapy  Passive ROM    Manual therapy comments  Spider Tapping using Rock Tape to help with Edema control    Passive ROM  gentle PROM for right knee in sitting then supine with slide board to help range, then ext overpressure gentle holds in supine               PT Short Term Goals - 11/03/17 1408      PT SHORT TERM GOAL #1   Title  I with HEP    Status  On-going       PT SHORT TERM GOAL #2   Title  Improved R knee ROM -5 to 100 degrees    Status  On-going      PT SHORT TERM GOAL #3   Title  Patient to report decreased pain by 50% with ambulation    Status  On-going      PT SHORT TERM GOAL #4   Title  Patient able to perform bed mobility independently    Status  On-going        PT Long Term Goals - 11/03/17 1407      PT LONG TERM GOAL #1   Title  I with advanced HEP    Status  On-going      PT LONG TERM GOAL #2   Title  Patient able to ambulate community distances safely without AD.    Status  On-going      PT LONG TERM GOAL #3   Title  Patient to demo improved R knee ROM 0-120 deg to normalize gait.    Status  On-going      PT LONG TERM GOAL #4   Title  Patient to demo 4+/5 RLE hip and knee strength to improve function.    Status  On-going      PT LONG TERM GOAL #5   Title  Patient to perform ADLs with 2/10 pain or less in R knee.    Status  On-going            Plan - 11/08/17 1210    Clinical Impression Statement  Patient tolerated treatment very well today with less pain overall. Patient has improved with reported self stretches at home and exercises with her husbands help. Patient has improved ROM in right knee active and passively. Patient has improved with abliity to activate quad muscle today with manual SAQ then good noted muscle facilitation with VMS. Today focused on ROM to increase range and functional independence. Goals progressing.     Rehab Potential  Excellent    PT Frequency  3x / week    PT Duration  4 weeks  PT Treatment/Interventions  ADLs/Self Care Home Management;Cryotherapy;Occupational psychologist;Therapeutic exercise;Manual techniques;Patient/family education;Neuromuscular re-education;Passive range of motion;Taping;Vasopneumatic Device    PT Next Visit Plan  TKA protocol (level 1 exercises), pain and edema control.    Consulted and Agree with Plan of Care  Patient        Patient will benefit from skilled therapeutic intervention in order to improve the following deficits and impairments:  Abnormal gait, Increased edema, Pain, Decreased range of motion, Impaired flexibility, Decreased strength  Visit Diagnosis: Stiffness of right knee, not elsewhere classified  Localized edema  Acute pain of right knee  Muscle weakness (generalized)     Problem List Patient Active Problem List   Diagnosis Date Noted  . OA (osteoarthritis) of knee 10/25/2017  . Well woman exam with routine gynecological exam 06/16/2017  . Vitamin D deficiency 03/25/2016    Phillips Climes, PTA 11/08/2017, 12:25 PM  [facility] [address] [place], [place], [address] Phone: [phone]   Fax:  [phone]  Name: Sarah Hicks MRN: [id number] Date of Birth: Aug 18, 1961

## 2017-11-10 ENCOUNTER — Encounter: Payer: Self-pay | Admitting: Physical Therapy

## 2017-11-10 ENCOUNTER — Ambulatory Visit: Payer: 59 | Admitting: Physical Therapy

## 2017-11-10 DIAGNOSIS — R600 Localized edema: Secondary | ICD-10-CM

## 2017-11-10 DIAGNOSIS — M25561 Pain in right knee: Secondary | ICD-10-CM | POA: Diagnosis not present

## 2017-11-10 DIAGNOSIS — M6281 Muscle weakness (generalized): Secondary | ICD-10-CM

## 2017-11-10 DIAGNOSIS — M25661 Stiffness of right knee, not elsewhere classified: Secondary | ICD-10-CM

## 2017-11-10 NOTE — Therapy (Signed)
[facility] [place], [place], [address] Phone: [phone]   Fax:  [phone]  Physical Therapy Treatment  Patient Details  Name: Sarah Hicks MRN: [id number] Date of Birth: 02-28-1961 Referring Provider: Dr. Gaynelle Arabian   Encounter Date: 11/10/2017  PT End of Session - 11/10/17 1158    Visit Number  6    Number of Visits  12    Date for PT Re-Evaluation  11/26/17    PT Start Time  1114    PT Stop Time  1213    PT Time Calculation (min)  59 min    Activity Tolerance  Patient tolerated treatment well    Behavior During Therapy  [facility] for tasks assessed/performed       Past Medical History:  Diagnosis Date  . Arthritis    oa  . Complication of anesthesia    told to breathe twice after a morton's neuroma surgery 2003, did fine with other surgeries since  . GERD (gastroesophageal reflux disease)   . Headache   . History of kidney stones    passed on own  . Hypertension   . Medical history non-contributory   . Scoliosis   . Vitamin D deficiency 03/25/2016    Past Surgical History:  Procedure Laterality Date  . ANKLE SURGERY Right 10 year ago  . cervical cryoablation  1998  . CHOLECYSTECTOMY  17 years ago  . COLONOSCOPY N/A 09/13/2013   Procedure: COLONOSCOPY;  Surgeon: Rogene Houston, MD;  [facility];  Service: Endoscopy;  Laterality: N/A;  730-moved to 69 Ann to notify pt  . FOOT SURGERY     Morton's neuroma  . KNEE SURGERY Left 10 years ago   torn ligemant and tendon repair  . TOTAL KNEE ARTHROPLASTY Right 10/25/2017   Procedure: RIGHT TOTAL KNEE ARTHROPLASTY;  Surgeon: Gaynelle Arabian, MD;  [facility]: [facility];  Service: Orthopedics;  Laterality: Right;  Adductor Block  . TUBAL LIGATION  19 years ago    There were no vitals filed for this visit.  Subjective Assessment - 11/10/17 1119    Subjective  Patient did well after last treatment with ongoing stiffness    Patient is accompained by:   Family member    Pertinent History  R ankle surgery, L knee surgery    Patient Stated Goals  to get back to walking without pain    Currently in Pain?  Yes    Pain Score  5     Pain Location  Knee    Pain Orientation  Right    Pain Descriptors / Indicators  Aching;Throbbing    Pain Type  Surgical pain    Pain Onset  1 to 4 weeks ago    Pain Frequency  Constant    Aggravating Factors   bending knee    Pain Relieving Factors  rest         [facility] PT Assessment - 11/10/17 0001      AROM   AROM Assessment Site  Knee    Right/Left Knee  Right    Right Knee Extension  -10    Right Knee Flexion  68      PROM   PROM Assessment Site  Knee    Right/Left Knee  Right    Right Knee Extension  -5    Right Knee Flexion  84                  [facility] Adult PT Treatment/Exercise -  11/10/17 0001      Electrical Stimulation   Electrical Stimulation Location  R quad    Electrical Stimulation Action  VMS    Electrical Stimulation Parameters  10/10 x52min with SAQ, difficulty with full range    Electrical Stimulation Goals  Neuromuscular facilitation      Vasopneumatic   Number Minutes Vasopneumatic   15 minutes    Vasopnuematic Location   Knee    Vasopneumatic Pressure  Low      Manual Therapy   Manual Therapy  Passive ROM    Passive ROM  gentle PROM for right knee in sitting then supine with slide board to help range, then ext overpressure gentle holds in supine               PT Short Term Goals - 11/03/17 1408      PT SHORT TERM GOAL #1   Title  I with HEP    Status  On-going      PT SHORT TERM GOAL #2   Title  Improved R knee ROM -5 to 100 degrees    Status  On-going      PT SHORT TERM GOAL #3   Title  Patient to report decreased pain by 50% with ambulation    Status  On-going      PT SHORT TERM GOAL #4   Title  Patient able to perform bed mobility independently    Status  On-going        PT Long Term Goals - 11/03/17 1407      PT LONG TERM GOAL #1    Title  I with advanced HEP    Status  On-going      PT LONG TERM GOAL #2   Title  Patient able to ambulate community distances safely without AD.    Status  On-going      PT LONG TERM GOAL #3   Title  Patient to demo improved R knee ROM 0-120 deg to normalize gait.    Status  On-going      PT LONG TERM GOAL #4   Title  Patient to demo 4+/5 RLE hip and knee strength to improve function.    Status  On-going      PT LONG TERM GOAL #5   Title  Patient to perform ADLs with 2/10 pain or less in R knee.    Status  On-going            Plan - 11/10/17 1159    Clinical Impression Statement  Patient tolerated treatment well today. Patient has improved with ability to sit edge of mat yet unable to let it hang on its own due to lack of ROM and discomfort. Patient was able to perform 33min of SAQ half range with VMS today and patient ale to lift leg onto boulster for the first time without assistance. Patient had improved quad contol and activation today. Patient improved with ROM in right knee active and passive today. Encouraged patient to cont with self stretches several times daily as well as exercises to improve mobility and function. Goals progressing yet ongoing.     Rehab Potential  Excellent    PT Frequency  3x / week    PT Duration  4 weeks    PT Treatment/Interventions  ADLs/Self Care Home Management;Cryotherapy;Occupational psychologist;Therapeutic exercise;Manual techniques;Patient/family education;Neuromuscular re-education;Passive range of motion;Taping;Vasopneumatic Device    PT Next Visit Plan  cont with POC for ROM, strengthening and tapping for edema.modalities PRN  Consulted and Agree with Plan of Care  Patient       Patient will benefit from skilled therapeutic intervention in order to improve the following deficits and impairments:  Abnormal gait, Increased edema, Pain, Decreased range of motion, Impaired flexibility, Decreased  strength  Visit Diagnosis: Stiffness of right knee, not elsewhere classified  Localized edema  Acute pain of right knee  Muscle weakness (generalized)     Problem List Patient Active Problem List   Diagnosis Date Noted  . OA (osteoarthritis) of knee 10/25/2017  . Well woman exam with routine gynecological exam 06/16/2017  . Vitamin D deficiency 03/25/2016    Phillips Climes, PTA 11/10/2017, 12:13 PM  [facility] [place], [place], [address] Phone: [phone]   Fax:  [phone]  Name: Sarah Hicks MRN: [id number] Date of Birth: 02-02-1961

## 2017-11-12 ENCOUNTER — Ambulatory Visit: Payer: 59 | Attending: Orthopedic Surgery | Admitting: Physical Therapy

## 2017-11-12 DIAGNOSIS — M25561 Pain in right knee: Secondary | ICD-10-CM | POA: Diagnosis present

## 2017-11-12 DIAGNOSIS — R600 Localized edema: Secondary | ICD-10-CM | POA: Diagnosis present

## 2017-11-12 DIAGNOSIS — M6281 Muscle weakness (generalized): Secondary | ICD-10-CM | POA: Diagnosis present

## 2017-11-12 DIAGNOSIS — M25661 Stiffness of right knee, not elsewhere classified: Secondary | ICD-10-CM | POA: Insufficient documentation

## 2017-11-12 NOTE — Therapy (Signed)
[facility] [place], [place], [address] Phone: [phone]   Fax:  [phone]  Physical Therapy Treatment  Patient Details  Name: Sarah Hicks MRN: [id number] Date of Birth: [birth year] Referring Provider: Dr. Gaynelle Arabian   Encounter Date: 11/12/2017  PT End of Session - 11/12/17 1056    Visit Number  7    Number of Visits  12    Date for PT Re-Evaluation  11/26/17    PT Start Time  1040    PT Stop Time  1135    PT Time Calculation (min)  55 min    Equipment Utilized During Treatment  Right knee immobilizer    Activity Tolerance  Patient tolerated treatment well    Behavior During Therapy  [facility] for tasks assessed/performed       Past Medical History:  Diagnosis Date  . Arthritis    oa  . Complication of anesthesia    told to breathe twice after a morton's neuroma surgery 2003, did fine with other surgeries since  . GERD (gastroesophageal reflux disease)   . Headache   . History of kidney stones    passed on own  . Hypertension   . Medical history non-contributory   . Scoliosis   . Vitamin D deficiency 03/25/2016    Past Surgical History:  Procedure Laterality Date  . ANKLE SURGERY Right 10 year ago  . cervical cryoablation  1998  . CHOLECYSTECTOMY  17 years ago  . COLONOSCOPY N/A 09/13/2013   Procedure: COLONOSCOPY;  Surgeon: Rogene Houston, MD;  [facility];  Service: Endoscopy;  Laterality: N/A;  730-moved to 60 Ann to notify pt  . FOOT SURGERY     Morton's neuroma  . KNEE SURGERY Left 10 years ago   torn ligemant and tendon repair  . TOTAL KNEE ARTHROPLASTY Right 10/25/2017   Procedure: RIGHT TOTAL KNEE ARTHROPLASTY;  Surgeon: Gaynelle Arabian, MD;  [facility];  Service: Orthopedics;  Laterality: Right;  Adductor Block  . TUBAL LIGATION  19 years ago    There were no vitals filed for this visit.                   [facility] Adult PT Treatment/Exercise - 11/12/17 0001       Knee/Hip Exercises: Supine   Heel Slides  AAROM;Right;2 sets;10 reps 3 second hold at end range flexion      Acupuncturist Location  R quad    Electrical Stimulation Action  VMS    Electrical Stimulation Parameters  10/10 x6 min with quad sets    Electrical Stimulation Goals  Neuromuscular facilitation      Vasopneumatic   Number Minutes Vasopneumatic   10 minutes    Vasopnuematic Location   Knee    Vasopneumatic Pressure  Medium      Manual Therapy   Manual Therapy  Passive ROM    Manual therapy comments  Spider Taping using rock tape for edema control    Passive ROM  Supine PROM to improve flexion and extension with gentle overpressure               PT Short Term Goals - 11/03/17 1408      PT SHORT TERM GOAL #1   Title  I with HEP    Status  On-going      PT SHORT TERM GOAL #2   Title  Improved R knee ROM -5 to 100 degrees  Status  On-going      PT SHORT TERM GOAL #3   Title  Patient to report decreased pain by 50% with ambulation    Status  On-going      PT SHORT TERM GOAL #4   Title  Patient able to perform bed mobility independently    Status  On-going        PT Long Term Goals - 11/03/17 1407      PT LONG TERM GOAL #1   Title  I with advanced HEP    Status  On-going      PT LONG TERM GOAL #2   Title  Patient able to ambulate community distances safely without AD.    Status  On-going      PT LONG TERM GOAL #3   Title  Patient to demo improved R knee ROM 0-120 deg to normalize gait.    Status  On-going      PT LONG TERM GOAL #4   Title  Patient to demo 4+/5 RLE hip and knee strength to improve function.    Status  On-going      PT LONG TERM GOAL #5   Title  Patient to perform ADLs with 2/10 pain or less in R knee.    Status  On-going            Plan - 11/12/17 1143    Clinical Impression Statement  Patient tolerated treatment well. Patient had difficulty with performing SAQ with VMS today;  reverted to quad sets for quad activiation. Patient demo fair/good muscle activation. Patient instructed to perform quad sets with unaffected LE for cross over phenomenon. No adverse affects noted upon removal of e-stim.      Clinical Presentation  Stable    Clinical Decision Making  Low    Rehab Potential  Excellent    PT Frequency  3x / week    PT Duration  4 weeks    PT Treatment/Interventions  ADLs/Self Care Home Management;Cryotherapy;Occupational psychologist;Therapeutic exercise;Manual techniques;Patient/family education;Neuromuscular re-education;Passive range of motion;Taping;Vasopneumatic Device    PT Next Visit Plan  Try AAROM half moons on bike to improve ROM, strengthening and taping for edema. modalities PRN.    PT Home Exercise Plan  Continue HEP for ROM.    Consulted and Agree with Plan of Care  Patient       Patient will benefit from skilled therapeutic intervention in order to improve the following deficits and impairments:  Abnormal gait, Increased edema, Pain, Decreased range of motion, Impaired flexibility, Decreased strength  Visit Diagnosis: Stiffness of right knee, not elsewhere classified  Localized edema  Acute pain of right knee  Muscle weakness (generalized)     Problem List Patient Active Problem List   Diagnosis Date Noted  . OA (osteoarthritis) of knee 10/25/2017  . Well woman exam with routine gynecological exam 06/16/2017  . Vitamin D deficiency 03/25/2016    Gabriela Eves, PT, DPT 11/12/2017, 12:15 PM  [facility] [address] [place], [place], [address] Phone: [phone]   Fax:  [phone]  Name: Sarah Hicks MRN: [id number] Date of Birth: October 08, 1961

## 2017-11-15 ENCOUNTER — Ambulatory Visit: Payer: 59 | Admitting: Physical Therapy

## 2017-11-15 ENCOUNTER — Encounter: Payer: Self-pay | Admitting: Physical Therapy

## 2017-11-15 DIAGNOSIS — M6281 Muscle weakness (generalized): Secondary | ICD-10-CM

## 2017-11-15 DIAGNOSIS — R600 Localized edema: Secondary | ICD-10-CM

## 2017-11-15 DIAGNOSIS — M25661 Stiffness of right knee, not elsewhere classified: Secondary | ICD-10-CM | POA: Diagnosis not present

## 2017-11-15 DIAGNOSIS — M25561 Pain in right knee: Secondary | ICD-10-CM

## 2017-11-15 NOTE — Therapy (Signed)
[facility] [place], [place], [address] Phone: [phone]   Fax:  [phone]  Physical Therapy Treatment  Patient Details  Name: Sarah Hicks MRN: [id number] Date of Birth: Apr 15, 1961 Referring Provider: Dr. Gaynelle Arabian   Encounter Date: 11/15/2017  PT End of Session - 11/15/17 1121    Visit Number  8    Number of Visits  12    Date for PT Re-Evaluation  11/26/17    PT Start Time  1033    PT Stop Time  1130    PT Time Calculation (min)  57 min    Activity Tolerance  Patient tolerated treatment well    Behavior During Therapy  [facility] for tasks assessed/performed       Past Medical History:  Diagnosis Date  . Arthritis    oa  . Complication of anesthesia    told to breathe twice after a morton's neuroma surgery 2003, did fine with other surgeries since  . GERD (gastroesophageal reflux disease)   . Headache   . History of kidney stones    passed on own  . Hypertension   . Medical history non-contributory   . Scoliosis   . Vitamin D deficiency 03/25/2016    Past Surgical History:  Procedure Laterality Date  . ANKLE SURGERY Right 10 year ago  . cervical cryoablation  1998  . CHOLECYSTECTOMY  17 years ago  . COLONOSCOPY N/A 09/13/2013   Procedure: COLONOSCOPY;  Surgeon: Rogene Houston, MD;  [facility];  Service: Endoscopy;  Laterality: N/A;  730-moved to 31 Ann to notify pt  . FOOT SURGERY     Morton's neuroma  . KNEE SURGERY Left 10 years ago   torn ligemant and tendon repair  . TOTAL KNEE ARTHROPLASTY Right 10/25/2017   Procedure: RIGHT TOTAL KNEE ARTHROPLASTY;  Surgeon: Gaynelle Arabian, MD;  [facility];  Service: Orthopedics;  Laterality: Right;  Adductor Block  . TUBAL LIGATION  19 years ago    There were no vitals filed for this visit.  Subjective Assessment - 11/15/17 1035    Subjective  Patient did well after last treatment with ongoing stiffness    Patient is accompained by:   Family member    Pertinent History  R ankle surgery, L knee surgery    Patient Stated Goals  to get back to walking without pain    Currently in Pain?  Yes    Pain Score  5     Pain Location  Knee    Pain Orientation  Right    Pain Descriptors / Indicators  Aching    Pain Type  Surgical pain    Pain Onset  More than a month ago    Pain Frequency  Intermittent    Aggravating Factors   bending knee    Pain Relieving Factors  rest         [facility] PT Assessment - 11/15/17 0001      AROM   AROM Assessment Site  Knee    Right/Left Knee  Right    Right Knee Extension  -9    Right Knee Flexion  70      PROM   PROM Assessment Site  Knee    Right/Left Knee  Right    Right Knee Extension  -4    Right Knee Flexion  90                  [facility] Adult PT Treatment/Exercise -  11/15/17 0001      Knee/Hip Exercises: Aerobic   Stationary Bike  x15min able to make full revolution x1 with manual assist      Knee/Hip Exercises: Supine   Short Arc Quad Sets  AROM;Strengthening;Right;10 reps;1 set;Limitations    Short Arc Quad Sets Limitations  half range yet able to hold [employer]  R quad    Chartered certified accountant  VMS    Electrical Stimulation Parameters  co-contract 10/10 x36min with quad set for activation    Electrical Stimulation Goals  Neuromuscular facilitation      Vasopneumatic   Number Minutes Vasopneumatic   10 minutes    Vasopnuematic Location   Knee    Vasopneumatic Pressure  Medium      Manual Therapy   Manual Therapy  Passive ROM    Manual therapy comments  Spider Taping using rock tape for edema control    Passive ROM  Supine and sitting PROM to improve flexion and extension with gentle overpressure               PT Short Term Goals - 11/03/17 1408      PT SHORT TERM GOAL #1   Title  I with HEP    Status  On-going      PT SHORT TERM GOAL #2   Title  Improved R knee ROM -5 to  100 degrees    Status  On-going      PT SHORT TERM GOAL #3   Title  Patient to report decreased pain by 50% with ambulation    Status  On-going      PT SHORT TERM GOAL #4   Title  Patient able to perform bed mobility independently    Status  On-going        PT Long Term Goals - 11/03/17 1407      PT LONG TERM GOAL #1   Title  I with advanced HEP    Status  On-going      PT LONG TERM GOAL #2   Title  Patient able to ambulate community distances safely without AD.    Status  On-going      PT LONG TERM GOAL #3   Title  Patient to demo improved R knee ROM 0-120 deg to normalize gait.    Status  On-going      PT LONG TERM GOAL #4   Title  Patient to demo 4+/5 RLE hip and knee strength to improve function.    Status  On-going      PT LONG TERM GOAL #5   Title  Patient to perform ADLs with 2/10 pain or less in R knee.    Status  On-going            Plan - 11/15/17 1123    Clinical Impression Statement  Patient continues to progress and tolerated treatment well today. Patient improved active and passive ROM in right knee today for flexion and ext. Patient able to make a full revolution with bike with manual assit. Patient continues to have minimal quad activation yet improvement overall. Goals progressing.     Rehab Potential  Excellent    PT Frequency  3x / week    PT Duration  4 weeks    PT Treatment/Interventions  ADLs/Self Care Home Management;Cryotherapy;Occupational psychologist;Therapeutic exercise;Manual techniques;Patient/family education;Neuromuscular re-education;Passive range of motion;Taping;Vasopneumatic Device    PT Next Visit Plan  Cont with  POC for ROM, strengthening and taping for edema. modalities PRN.    Consulted and Agree with Plan of Care  Patient       Patient will benefit from skilled therapeutic intervention in order to improve the following deficits and impairments:  Abnormal gait, Increased edema, Pain, Decreased  range of motion, Impaired flexibility, Decreased strength  Visit Diagnosis: Stiffness of right knee, not elsewhere classified  Localized edema  Acute pain of right knee  Muscle weakness (generalized)     Problem List Patient Active Problem List   Diagnosis Date Noted  . OA (osteoarthritis) of knee 10/25/2017  . Well woman exam with routine gynecological exam 06/16/2017  . Vitamin D deficiency 03/25/2016    Phillips Climes, PTA 11/15/2017, 11:31 AM  [facility] [place], [place], [address] Phone: [phone]   Fax:  [phone]  Name: Sarah Hicks MRN: [id number] Date of Birth: 1961/05/15

## 2017-11-18 ENCOUNTER — Encounter: Payer: Self-pay | Admitting: Physical Therapy

## 2017-11-18 ENCOUNTER — Ambulatory Visit: Payer: 59 | Admitting: Physical Therapy

## 2017-11-18 DIAGNOSIS — R600 Localized edema: Secondary | ICD-10-CM

## 2017-11-18 DIAGNOSIS — M25661 Stiffness of right knee, not elsewhere classified: Secondary | ICD-10-CM

## 2017-11-18 DIAGNOSIS — M6281 Muscle weakness (generalized): Secondary | ICD-10-CM

## 2017-11-18 DIAGNOSIS — M25561 Pain in right knee: Secondary | ICD-10-CM

## 2017-11-18 NOTE — Therapy (Signed)
[facility] [place], [place], [address] Phone: [phone]   Fax:  [phone]  Physical Therapy Treatment  Patient Details  Name: Sarah Hicks MRN: [id number] Date of Birth: [birth year] Referring Provider: Dr. Gaynelle Arabian   Encounter Date: 11/18/2017  PT End of Session - 11/18/17 1104    Visit Number  9    Number of Visits  12    Date for PT Re-Evaluation  11/26/17    PT Start Time  1031    PT Stop Time  1130    PT Time Calculation (min)  59 min    Activity Tolerance  Patient tolerated treatment well    Behavior During Therapy  [facility] for tasks assessed/performed       Past Medical History:  Diagnosis Date  . Arthritis    oa  . Complication of anesthesia    told to breathe twice after a morton's neuroma surgery 2003, did fine with other surgeries since  . GERD (gastroesophageal reflux disease)   . Headache   . History of kidney stones    passed on own  . Hypertension   . Medical history non-contributory   . Scoliosis   . Vitamin D deficiency 03/25/2016    Past Surgical History:  Procedure Laterality Date  . ANKLE SURGERY Right 10 year ago  . cervical cryoablation  1998  . CHOLECYSTECTOMY  17 years ago  . COLONOSCOPY N/A 09/13/2013   Procedure: COLONOSCOPY;  Surgeon: Rogene Houston, MD;  [facility];  Service: Endoscopy;  Laterality: N/A;  730-moved to 11 Ann to notify pt  . FOOT SURGERY     Morton's neuroma  . KNEE SURGERY Left 10 years ago   torn ligemant and tendon repair  . TOTAL KNEE ARTHROPLASTY Right 10/25/2017   Procedure: RIGHT TOTAL KNEE ARTHROPLASTY;  Surgeon: Gaynelle Arabian, MD;  [facility];  Service: Orthopedics;  Laterality: Right;  Adductor Block  . TUBAL LIGATION  19 years ago    There were no vitals filed for this visit.  Subjective Assessment - 11/18/17 1034    Subjective  Patient arrived with ongoing tightness    Patient is accompained by:  Family member    Pertinent History  R ankle surgery, L knee surgery    Patient Stated Goals  to get back to walking without pain    Currently in Pain?  Yes    Pain Score  6     Pain Location  Knee    Pain Orientation  Right    Pain Descriptors / Indicators  Aching;Tightness    Pain Type  Surgical pain    Pain Onset  More than a month ago    Pain Frequency  Intermittent    Aggravating Factors   bending knee    Pain Relieving Factors  rest         [facility] PT Assessment - 11/18/17 0001      AROM   AROM Assessment Site  Knee    Right/Left Knee  Right    Right Knee Extension  -7    Right Knee Flexion  72      PROM   PROM Assessment Site  Knee    Right/Left Knee  Right    Right Knee Extension  -3    Right Knee Flexion  93                  [facility] Adult PT Treatment/Exercise - 11/18/17 0001  Soil scientist Parameters  10/10 x15min with quad set for activation      Vasopneumatic   Number Minutes Vasopneumatic   10 minutes    Vasopnuematic Location   Knee    Vasopneumatic Pressure  Medium      Manual Therapy   Manual Therapy  Passive ROM    Passive ROM  Supine and sitting PROM to improve flexion and extension with gentle overpressure               PT Short Term Goals - 11/03/17 1408      PT SHORT TERM GOAL #1   Title  I with HEP    Status  On-going      PT SHORT TERM GOAL #2   Title  Improved R knee ROM -5 to 100 degrees    Status  On-going      PT SHORT TERM GOAL #3   Title  Patient to report decreased pain by 50% with ambulation    Status  On-going      PT SHORT TERM GOAL #4   Title  Patient able to perform bed mobility independently    Status  On-going        PT Long Term Goals - 11/03/17 1407      PT LONG TERM GOAL #1   Title  I with advanced HEP    Status  On-going      PT LONG TERM GOAL #2   Title  Patient able to ambulate  community distances safely without AD.    Status  On-going      PT LONG TERM GOAL #3   Title  Patient to demo improved R knee ROM 0-120 deg to normalize gait.    Status  On-going      PT LONG TERM GOAL #4   Title  Patient to demo 4+/5 RLE hip and knee strength to improve function.    Status  On-going      PT LONG TERM GOAL #5   Title  Patient to perform ADLs with 2/10 pain or less in R knee.    Status  On-going            Plan - 11/18/17 1105    Clinical Impression Statement  Patient arrived with ongoing tightness yet tolerated treatment well today and continues to improve. Focused on manual stretching today to improve ROM. Patient has improved with passive and active ROM each session and today continues improvement. Patient has ongoing edema which restricts ROM. Patient able to perform SLR with minimal ext lag. Good activation with VMS with quad sets today. Goals progressing and goals ongoing.    Rehab Potential  Excellent    PT Frequency  3x / week    PT Duration  4 weeks    PT Treatment/Interventions  ADLs/Self Care Home Management;Cryotherapy;Occupational psychologist;Therapeutic exercise;Manual techniques;Patient/family education;Neuromuscular re-education;Passive range of motion;Taping;Vasopneumatic Device    PT Next Visit Plan  Cont with POC for ROM, strengthening and taping for edema. modalities PRN.    Consulted and Agree with Plan of Care  Patient       Patient will benefit from skilled therapeutic intervention in order to improve the following deficits and impairments:  Abnormal gait, Increased edema, Pain, Decreased range of motion, Impaired flexibility, Decreased strength  Visit Diagnosis: Stiffness of right knee, not elsewhere classified  Localized edema  Acute pain of right knee  Muscle weakness (generalized)     Problem List Patient Active Problem List   Diagnosis Date Noted  . OA (osteoarthritis) of knee 10/25/2017  . Well  woman exam with routine gynecological exam 06/16/2017  . Vitamin D deficiency 03/25/2016    Phillips Climes, PTA 11/18/2017, 11:31 AM  [facility] [place], [place], [address] Phone: [phone]   Fax:  [phone]  Name: Sarah Hicks MRN: [id number] Date of Birth: 09-04-1961

## 2017-11-19 ENCOUNTER — Ambulatory Visit: Payer: 59 | Admitting: Physical Therapy

## 2017-11-19 ENCOUNTER — Encounter: Payer: Self-pay | Admitting: Physical Therapy

## 2017-11-19 DIAGNOSIS — M25661 Stiffness of right knee, not elsewhere classified: Secondary | ICD-10-CM | POA: Diagnosis not present

## 2017-11-19 DIAGNOSIS — M6281 Muscle weakness (generalized): Secondary | ICD-10-CM

## 2017-11-19 DIAGNOSIS — M25561 Pain in right knee: Secondary | ICD-10-CM

## 2017-11-19 DIAGNOSIS — R600 Localized edema: Secondary | ICD-10-CM

## 2017-11-19 NOTE — Therapy (Signed)
[facility] [place], [place], [address] Phone: [phone]   Fax:  [phone]  Physical Therapy Treatment  Patient Details  Name: Sarah Hicks MRN: [id number] Date of Birth: July 27, 1961 Referring Provider: Dr. Gaynelle Arabian   Encounter Date: 11/19/2017  PT End of Session - 11/19/17 1235    Visit Number  10    Number of Visits  12    Date for PT Re-Evaluation  11/26/17    PT Start Time  1034    PT Stop Time  1130    PT Time Calculation (min)  56 min    Activity Tolerance  Patient tolerated treatment well    Behavior During Therapy  [facility] for tasks assessed/performed       Past Medical History:  Diagnosis Date  . Arthritis    oa  . Complication of anesthesia    told to breathe twice after a morton's neuroma surgery 2003, did fine with other surgeries since  . GERD (gastroesophageal reflux disease)   . Headache   . History of kidney stones    passed on own  . Hypertension   . Medical history non-contributory   . Scoliosis   . Vitamin D deficiency 03/25/2016    Past Surgical History:  Procedure Laterality Date  . ANKLE SURGERY Right 10 year ago  . cervical cryoablation  1998  . CHOLECYSTECTOMY  17 years ago  . COLONOSCOPY N/A 09/13/2013   Procedure: COLONOSCOPY;  Surgeon: Rogene Houston, MD;  [facility];  Service: Endoscopy;  Laterality: N/A;  730-moved to 76 Ann to notify pt  . FOOT SURGERY     Morton's neuroma  . KNEE SURGERY Left 10 years ago   torn ligemant and tendon repair  . TOTAL KNEE ARTHROPLASTY Right 10/25/2017   Procedure: RIGHT TOTAL KNEE ARTHROPLASTY;  Surgeon: Gaynelle Arabian, MD;  Location: [facility];  Service: Orthopedics;  Laterality: Right;  Adductor Block  . TUBAL LIGATION  19 years ago    There were no vitals filed for this visit.  Subjective Assessment - 11/19/17 1233    Subjective  Patient arriving to therapy with her daughter with pain noted in her R knee. Pt arriving amb  with a RW.     Patient is accompained by:  Family member    Pertinent History  R ankle surgery, L knee surgery    Patient Stated Goals  to get back to walking without pain    Currently in Pain?  Yes    Pain Score  5     Pain Location  Knee    Pain Orientation  Right    Pain Descriptors / Indicators  Aching;Tightness;Discomfort    Pain Type  Surgical pain    Pain Onset  More than a month ago    Pain Frequency  Intermittent    Aggravating Factors   bending the knee    Pain Relieving Factors  rest                      [facility] Adult PT Treatment/Exercise - 11/19/17 0001      Knee/Hip Exercises: Aerobic   Stationary Bike  x 10 minutes, rocking back and forth, unable to complete full revolution      Knee/Hip Exercises: Supine   Short Arc Quad Sets  AROM;Strengthening;2 sets;10 reps    Heel Slides  AROM;15 reps      Modalities   Modalities  Cryotherapy  Cryotherapy   Number Minutes Cryotherapy  15 Minutes    Cryotherapy Location  Knee    Type of Cryotherapy  Ice pack with elevation, vaso machines were occupied by other pts      Electrical Stimulation   Electrical Stimulation Location  R quad    Electrical Stimulation Action  Pre-mod unable to get full contraction with VMS today    Electrical Stimulation Parameters  80-150 Hz x 15 minutes    Electrical Stimulation Goals  Edema;Pain      Manual Therapy   Manual Therapy  Passive ROM    Manual therapy comments  Spider Taping x 4 strips for edema control    Passive ROM  Supine and sitting PROM to improve flexion and extension with gentle overpressure             PT Education - 11/19/17 1235    Education provided  Yes    Education Details  Prone knee hangs    Person(s) Educated  Patient;Child(ren)    Methods  Explanation;Demonstration    Comprehension  Verbalized understanding       PT Short Term Goals - 11/19/17 1244      PT SHORT TERM GOAL #1   Title  I with HEP    Status  On-going      PT  SHORT TERM GOAL #2   Title  Improved R knee ROM -5 to 100 degrees    Status  On-going      PT SHORT TERM GOAL #3   Title  Patient to report decreased pain by 50% with ambulation    Status  On-going      PT SHORT TERM GOAL #4   Title  Patient able to perform bed mobility independently    Status  Achieved        PT Long Term Goals - 11/03/17 1407      PT LONG TERM GOAL #1   Title  I with advanced HEP    Status  On-going      PT LONG TERM GOAL #2   Title  Patient able to ambulate community distances safely without AD.    Status  On-going      PT LONG TERM GOAL #3   Title  Patient to demo improved R knee ROM 0-120 deg to normalize gait.    Status  On-going      PT LONG TERM GOAL #4   Title  Patient to demo 4+/5 RLE hip and knee strength to improve function.    Status  On-going      PT LONG TERM GOAL #5   Title  Patient to perform ADLs with 2/10 pain or less in R knee.    Status  On-going            Plan - 11/19/17 1236    Clinical Impression Statement  Pt arriving to therapy with her daughter amb with a RW reporting 5/10 pain in her Right knee. Pt tolerated exercises well, limited by pain with STW to IT band and quads. Pt instructed on gentle STW at home using a tennis ball to pt's tolerance. Pt with limited AROM and PROM due to pain/swelling. Spider taping performed using 4 strips. Continue skilled PT to progress pt toward goals set.     Rehab Potential  Excellent    PT Frequency  3x / week    PT Duration  4 weeks    PT Treatment/Interventions  ADLs/Self Care Home Management;Cryotherapy;Occupational psychologist;Therapeutic exercise;Manual  techniques;Patient/family education;Neuromuscular re-education;Passive range of motion;Taping;Vasopneumatic Device    PT Next Visit Plan  Needs recert for additional visits. Cont with POC for ROM, strengthening and taping for edema. modalities PRN, attempt prone knee hangs    PT Home Exercise Plan  Continue  HEP for ROM, instructed pt in prone knee hangs     Consulted and Agree with Plan of Care  Patient;Family member/caregiver    Family Member Consulted  daughter       Patient will benefit from skilled therapeutic intervention in order to improve the following deficits and impairments:  Abnormal gait, Increased edema, Pain, Decreased range of motion, Impaired flexibility, Decreased strength  Visit Diagnosis: Stiffness of right knee, not elsewhere classified  Localized edema  Acute pain of right knee  Muscle weakness (generalized)     Problem List Patient Active Problem List   Diagnosis Date Noted  . OA (osteoarthritis) of knee 10/25/2017  . Well woman exam with routine gynecological exam 06/16/2017  . Vitamin D deficiency 03/25/2016   Kearney Hard, PT 11/19/17 12:58 PM    Edmonia Lynch Martin2/05/2018, 12:58 PM  [facility] [place], [place], [address] Phone: [phone]   Fax:  [phone]  Name: Sarah Hicks MRN: [id number] Date of Birth: 07-29-1961

## 2017-11-22 ENCOUNTER — Ambulatory Visit: Payer: 59 | Admitting: Physical Therapy

## 2017-11-22 ENCOUNTER — Encounter: Payer: Self-pay | Admitting: Physical Therapy

## 2017-11-22 DIAGNOSIS — R600 Localized edema: Secondary | ICD-10-CM

## 2017-11-22 DIAGNOSIS — M25661 Stiffness of right knee, not elsewhere classified: Secondary | ICD-10-CM | POA: Diagnosis not present

## 2017-11-22 DIAGNOSIS — M25561 Pain in right knee: Secondary | ICD-10-CM

## 2017-11-22 DIAGNOSIS — M6281 Muscle weakness (generalized): Secondary | ICD-10-CM

## 2017-11-22 NOTE — Patient Instructions (Addendum)
   Strengthening: Quadriceps Set  Strengthening: Terminal Knee Extension (Supine)   With right knee over bolster, straighten knee by tightening muscles on top of thigh. Keep bottom of knee on bolster. Repeat _10___ times per set. Do ___2-3_ sets per session. Do _2-3___ sessions per day.    Strengthening: Hip Abduction (Side-Lying)  Strengthening: Straight Leg Raise (Phase 1)  Repeat _10___ times per set. Do __2__ sets per session. Do __2__ sessions per day.   Bridging   Slowly raise buttocks from floor, keeping stomach tight. Repeat _10___ times per set. Do __2__ sets per session. Do __2__ sessions per day.   Straight Leg Raise   Tighten stomach and slowly raise locked right leg __4__ inches from floor. Repeat __10-30__ times per set. Do __2__ sets per session. Do __2__ sessions per day.   Knee Wall Slide    Slowly "walk" or slide feet on wall toward floor until stretch is felt in knees. Repeat _10-20___ times per set. Do __1-2__ sets per session. Do _1-4___ sessions per day.

## 2017-11-22 NOTE — Therapy (Signed)
[facility] [place], [place], [address] Phone: [phone]   Fax:  [phone]  Physical Therapy Treatment  Patient Details  Name: Sarah Hicks MRN: [id number] Date of Birth: 01-Aug-1961 Referring Provider: Dr. Gaynelle Arabian   Encounter Date: 11/22/2017  PT End of Session - 11/22/17 1109    Visit Number  11    Number of Visits  12    Date for PT Re-Evaluation  11/26/17    PT Start Time  6962    PT Stop Time  1126    PT Time Calculation (min)  51 min    Activity Tolerance  Patient tolerated treatment well    Behavior During Therapy  [facility] for tasks assessed/performed       Past Medical History:  Diagnosis Date  . Arthritis    oa  . Complication of anesthesia    told to breathe twice after a morton's neuroma surgery 2003, did fine with other surgeries since  . GERD (gastroesophageal reflux disease)   . Headache   . History of kidney stones    passed on own  . Hypertension   . Medical history non-contributory   . Scoliosis   . Vitamin D deficiency 03/25/2016    Past Surgical History:  Procedure Laterality Date  . ANKLE SURGERY Right 10 year ago  . cervical cryoablation  1998  . CHOLECYSTECTOMY  17 years ago  . COLONOSCOPY N/A 09/13/2013   Procedure: COLONOSCOPY;  Surgeon: Rogene Houston, MD;  [facility];  Service: Endoscopy;  Laterality: N/A;  730-moved to 18 Ann to notify pt  . FOOT SURGERY     Morton's neuroma  . KNEE SURGERY Left 10 years ago   torn ligemant and tendon repair  . TOTAL KNEE ARTHROPLASTY Right 10/25/2017   Procedure: RIGHT TOTAL KNEE ARTHROPLASTY;  Surgeon: Gaynelle Arabian, MD;  [facility];  Service: Orthopedics;  Laterality: Right;  Adductor Block  . TUBAL LIGATION  19 years ago    There were no vitals filed for this visit.  Subjective Assessment - 11/22/17 1036    Subjective  Patient arrived with some discomfort and reported she was able to go around full range on  bike    Pertinent History  R ankle surgery, L knee surgery    Patient Stated Goals  to get back to walking without pain    Currently in Pain?  Yes    Pain Score  3     Pain Location  Knee    Pain Orientation  Right    Pain Descriptors / Indicators  Tightness;Discomfort    Pain Type  Surgical pain    Pain Onset  More than a month ago    Pain Frequency  Intermittent    Aggravating Factors   bending    Pain Relieving Factors  rest         [facility] PT Assessment - 11/22/17 0001      AROM   AROM Assessment Site  Knee    Right/Left Knee  Right    Right Knee Extension  -5    Right Knee Flexion  80      PROM   PROM Assessment Site  Knee    Right/Left Knee  Right    Right Knee Extension  0    Right Knee Flexion  98                  [facility] Adult PT Treatment/Exercise - 11/22/17 0001  Knee/Hip Exercises: Supine   Terminal Knee Extension  Strengthening;Right;20 reps;10 reps    Bridges  Strengthening;Both;10 reps    Straight Leg Raises  Strengthening;Right;20 reps    Straight Leg Raise with External Rotation  Strengthening;Right;10 reps      Knee/Hip Exercises: Sidelying   Hip ABduction  Strengthening;Right;20 reps      Vasopneumatic   Number Minutes Vasopneumatic   15 minutes    Vasopnuematic Location   Knee    Vasopneumatic Pressure  Medium      Manual Therapy   Manual Therapy  Passive ROM    Passive ROM  Supine and sitting PROM to improve flexion and extension with gentle overpressure, started wall slide with assistance for flexion today             PT Education - 11/22/17 1113    Education provided  Yes    Education Details  HEP    Person(s) Educated  Patient    Methods  Explanation;Demonstration;Handout    Comprehension  Verbalized understanding;Returned demonstration       PT Short Term Goals - 11/19/17 1244      PT SHORT TERM GOAL #1   Title  I with HEP    Status  On-going      PT SHORT TERM GOAL #2   Title  Improved R knee ROM -5 to  100 degrees    Status  On-going      PT SHORT TERM GOAL #3   Title  Patient to report decreased pain by 50% with ambulation    Status  On-going      PT SHORT TERM GOAL #4   Title  Patient able to perform bed mobility independently    Status  Achieved        PT Long Term Goals - 11/03/17 1407      PT LONG TERM GOAL #1   Title  I with advanced HEP    Status  On-going      PT LONG TERM GOAL #2   Title  Patient able to ambulate community distances safely without AD.    Status  On-going      PT LONG TERM GOAL #3   Title  Patient to demo improved R knee ROM 0-120 deg to normalize gait.    Status  On-going      PT LONG TERM GOAL #4   Title  Patient to demo 4+/5 RLE hip and knee strength to improve function.    Status  On-going      PT LONG TERM GOAL #5   Title  Patient to perform ADLs with 2/10 pain or less in R knee.    Status  On-going            Plan - 11/22/17 1113    Clinical Impression Statement  Patient tolerated treatment well today. Patient has continued to improve with ROM for right knee both active and passively. Today started with strengtheing exercises for right LE with HEP provided. Patient has continued to progress and is making progress toward goals.     Rehab Potential  Excellent    PT Frequency  3x / week    PT Duration  4 weeks    PT Treatment/Interventions  ADLs/Self Care Home Management;Cryotherapy;Occupational psychologist;Therapeutic exercise;Manual techniques;Patient/family education;Neuromuscular re-education;Passive range of motion;Taping;Vasopneumatic Device    PT Next Visit Plan  Cont with POC for ROM, strengthening and taping for edema. modalities PRN,(MD appt F/U next week)    Consulted and Agree  with Plan of Care  Patient       Patient will benefit from skilled therapeutic intervention in order to improve the following deficits and impairments:  Abnormal gait, Increased edema, Pain, Decreased range of motion,  Impaired flexibility, Decreased strength  Visit Diagnosis: Stiffness of right knee, not elsewhere classified  Localized edema  Acute pain of right knee  Muscle weakness (generalized)     Problem List Patient Active Problem List   Diagnosis Date Noted  . OA (osteoarthritis) of knee 10/25/2017  . Well woman exam with routine gynecological exam 06/16/2017  . Vitamin D deficiency 03/25/2016    Phillips Climes, PTA 11/22/2017, 11:26 AM  [facility] [place], [place], [address] Phone: [phone]   Fax:  [phone]  Name: Sarah Hicks MRN: [id number] Date of Birth: 05-22-1961

## 2017-11-24 ENCOUNTER — Ambulatory Visit: Payer: 59 | Admitting: Physical Therapy

## 2017-11-24 ENCOUNTER — Encounter: Payer: Self-pay | Admitting: Physical Therapy

## 2017-11-24 DIAGNOSIS — R600 Localized edema: Secondary | ICD-10-CM

## 2017-11-24 DIAGNOSIS — M25661 Stiffness of right knee, not elsewhere classified: Secondary | ICD-10-CM

## 2017-11-24 DIAGNOSIS — M25561 Pain in right knee: Secondary | ICD-10-CM

## 2017-11-24 DIAGNOSIS — M6281 Muscle weakness (generalized): Secondary | ICD-10-CM

## 2017-11-24 NOTE — Therapy (Signed)
[facility] [place], [place], [address] Phone: [phone]   Fax:  [phone]  Physical Therapy Treatment  Patient Details  Name: Sarah Hicks MRN: [id number] Date of Birth: July 19, 1961 Referring Provider: Dr. Gaynelle Arabian   Encounter Date: 11/24/2017  PT End of Session - 11/24/17 1120    Visit Number  12    Number of Visits  12    Date for PT Re-Evaluation  11/26/17    PT Start Time  1031    PT Stop Time  1130    PT Time Calculation (min)  59 min    Activity Tolerance  Patient tolerated treatment well    Behavior During Therapy  [facility] for tasks assessed/performed       Past Medical History:  Diagnosis Date  . Arthritis    oa  . Complication of anesthesia    told to breathe twice after a morton's neuroma surgery 2003, did fine with other surgeries since  . GERD (gastroesophageal reflux disease)   . Headache   . History of kidney stones    passed on own  . Hypertension   . Medical history non-contributory   . Scoliosis   . Vitamin D deficiency 03/25/2016    Past Surgical History:  Procedure Laterality Date  . ANKLE SURGERY Right 10 year ago  . cervical cryoablation  1998  . CHOLECYSTECTOMY  17 years ago  . COLONOSCOPY N/A 09/13/2013   Procedure: COLONOSCOPY;  Surgeon: Rogene Houston, MD;  [facility];  Service: Endoscopy;  Laterality: N/A;  730-moved to 47 Ann to notify pt  . FOOT SURGERY     Morton's neuroma  . KNEE SURGERY Left 10 years ago   torn ligemant and tendon repair  . TOTAL KNEE ARTHROPLASTY Right 10/25/2017   Procedure: RIGHT TOTAL KNEE ARTHROPLASTY;  Surgeon: Gaynelle Arabian, MD;  [facility];  Service: Orthopedics;  Laterality: Right;  Adductor Block  . TUBAL LIGATION  19 years ago    There were no vitals filed for this visit.  Subjective Assessment - 11/24/17 1034    Subjective  Patient arrived with some discomfort and reported progress    Pertinent History  R ankle  surgery, L knee surgery    Patient Stated Goals  to get back to walking without pain    Currently in Pain?  Yes    Pain Score  3     Pain Location  Knee    Pain Orientation  Right    Pain Descriptors / Indicators  Tightness;Discomfort    Pain Onset  More than a month ago    Pain Frequency  Intermittent    Aggravating Factors   bending    Pain Relieving Factors  rest         [facility] PT Assessment - 11/24/17 0001      AROM   AROM Assessment Site  Knee    Right/Left Knee  Right    Right Knee Extension  -3    Right Knee Flexion  85      PROM   PROM Assessment Site  Knee    Right/Left Knee  Right    Right Knee Extension  0    Right Knee Flexion  100                  [facility] Adult PT Treatment/Exercise - 11/24/17 0001      Knee/Hip Exercises: Standing   Forward Step Up  Right;Step Height: 4";20 reps  Knee/Hip Exercises: Supine   Other Supine Knee/Hip Exercises  wall slide x41min      Electrical Stimulation   Electrical Stimulation Location  right knee    Electrical Stimulation Action  premod    Electrical Stimulation Parameters  1-10hz  x30min    Electrical Stimulation Goals  Edema;Pain      Vasopneumatic   Number Minutes Vasopneumatic   15 minutes    Vasopnuematic Location   Knee    Vasopneumatic Pressure  Medium      Manual Therapy   Manual Therapy  Passive ROM    Manual therapy comments  Spider Taping x 2 strips for edema control    Passive ROM  Supine and sitting PROM to improve flexion and extension with gentle overpressure, started wall slide with assistance for flexion today               PT Short Term Goals - 11/19/17 1244      PT SHORT TERM GOAL #1   Title  I with HEP    Status  On-going      PT SHORT TERM GOAL #2   Title  Improved R knee ROM -5 to 100 degrees    Status  On-going      PT SHORT TERM GOAL #3   Title  Patient to report decreased pain by 50% with ambulation    Status  On-going      PT SHORT TERM GOAL #4   Title   Patient able to perform bed mobility independently    Status  Achieved        PT Long Term Goals - 11/03/17 1407      PT LONG TERM GOAL #1   Title  I with advanced HEP    Status  On-going      PT LONG TERM GOAL #2   Title  Patient able to ambulate community distances safely without AD.    Status  On-going      PT LONG TERM GOAL #3   Title  Patient to demo improved R knee ROM 0-120 deg to normalize gait.    Status  On-going      PT LONG TERM GOAL #4   Title  Patient to demo 4+/5 RLE hip and knee strength to improve function.    Status  On-going      PT LONG TERM GOAL #5   Title  Patient to perform ADLs with 2/10 pain or less in R knee.    Status  On-going            Plan - 11/24/17 1121    Clinical Impression Statement  Patient tolerated treatment well today. Focused on manual ROM in knee today, as patient rode stationary bike prior to treatment. Patient able to perform full revolution on bike. Patient continues to improve with ROM in right knee. Patient has ongoing edema which restrics ROM and ongoing discomfort reported esp with ROM actvities. Patient able to start standing exercises with good quad control. Goals progressing.    Rehab Potential  Excellent    PT Frequency  3x / week    PT Duration  4 weeks    PT Treatment/Interventions  ADLs/Self Care Home Management;Cryotherapy;Occupational psychologist;Therapeutic exercise;Manual techniques;Patient/family education;Neuromuscular re-education;Passive range of motion;Taping;Vasopneumatic Device    PT Next Visit Plan  Cont with POC for ROM, strengthening and taping for edema. modalities PRN,(MD appt F/U next week 12/07/17)    Consulted and Agree with Plan of Care  Patient  Patient will benefit from skilled therapeutic intervention in order to improve the following deficits and impairments:  Abnormal gait, Increased edema, Pain, Decreased range of motion, Impaired flexibility, Decreased  strength  Visit Diagnosis: Stiffness of right knee, not elsewhere classified  Localized edema  Acute pain of right knee  Muscle weakness (generalized)     Problem List Patient Active Problem List   Diagnosis Date Noted  . OA (osteoarthritis) of knee 10/25/2017  . Well woman exam with routine gynecological exam 06/16/2017  . Vitamin D deficiency 03/25/2016    Phillips Climes, PTA 11/24/2017, 11:35 AM  [facility] [place], [place], [address] Phone: [phone]   Fax:  [phone]  Name: Sarah Hicks MRN: [id number] Date of Birth: [birth year]

## 2017-11-26 ENCOUNTER — Ambulatory Visit: Payer: 59 | Admitting: Physical Therapy

## 2017-11-26 DIAGNOSIS — M25661 Stiffness of right knee, not elsewhere classified: Secondary | ICD-10-CM | POA: Diagnosis not present

## 2017-11-26 DIAGNOSIS — M25561 Pain in right knee: Secondary | ICD-10-CM

## 2017-11-26 DIAGNOSIS — M6281 Muscle weakness (generalized): Secondary | ICD-10-CM

## 2017-11-26 DIAGNOSIS — R600 Localized edema: Secondary | ICD-10-CM

## 2017-11-26 NOTE — Patient Instructions (Signed)
   Talasia Saulter, PT, DPT [facility] [address] [place], [place], [address] Phone: [phone]   Fax:  [phone]  

## 2017-11-26 NOTE — Therapy (Signed)
[facility] [place], [place], [address] Phone: [phone]   Fax:  [phone]  Physical Therapy Treatment  Patient Details  Name: Sarah Hicks MRN: [id number] Date of Birth: [birth year] Referring Provider: Dr. Gaynelle Arabian   Encounter Date: 11/26/2017  PT End of Session - 11/26/17 1039    Visit Number  13    Number of Visits  24    Date for PT Re-Evaluation  01/07/18    PT Start Time  6767    PT Stop Time  1138    PT Time Calculation (min)  63 min    Activity Tolerance  Patient tolerated treatment well    Behavior During Therapy  [facility] for tasks assessed/performed       Past Medical History:  Diagnosis Date  . Arthritis    oa  . Complication of anesthesia    told to breathe twice after a morton's neuroma surgery 2003, did fine with other surgeries since  . GERD (gastroesophageal reflux disease)   . Headache   . History of kidney stones    passed on own  . Hypertension   . Medical history non-contributory   . Scoliosis   . Vitamin D deficiency 03/25/2016    Past Surgical History:  Procedure Laterality Date  . ANKLE SURGERY Right 10 year ago  . cervical cryoablation  1998  . CHOLECYSTECTOMY  17 years ago  . COLONOSCOPY N/A 09/13/2013   Procedure: COLONOSCOPY;  Surgeon: Rogene Houston, MD;  [facility];  Service: Endoscopy;  Laterality: N/A;  730-moved to 3 Ann to notify pt  . FOOT SURGERY     Morton's neuroma  . KNEE SURGERY Left 10 years ago   torn ligemant and tendon repair  . TOTAL KNEE ARTHROPLASTY Right 10/25/2017   Procedure: RIGHT TOTAL KNEE ARTHROPLASTY;  Surgeon: Gaynelle Arabian, MD;  [facility]: [facility];  Service: Orthopedics;  Laterality: Right;  Adductor Block  . TUBAL LIGATION  19 years ago    There were no vitals filed for this visit.  Subjective Assessment - 11/26/17 1231    Subjective  Patient reported "feeling good". Patient also feels she has been progressing well the past  week.    Patient is accompained by:  Family member    Pertinent History  R ankle surgery, L knee surgery    Patient Stated Goals  to get back to walking without pain    Currently in Pain?  Yes    Pain Score  3     Pain Orientation  Right    Pain Descriptors / Indicators  Tightness;Discomfort    Pain Type  Surgical pain    Pain Onset  More than a month ago    Pain Frequency  Intermittent         [facility] PT Assessment - 11/26/17 0001      AROM   AROM Assessment Site  Knee    Right/Left Knee  Right    Right Knee Extension  -3    Right Knee Flexion  85      PROM   PROM Assessment Site  Knee    Right/Left Knee  Right    Right Knee Extension  0    Right Knee Flexion  100                  [facility] Adult PT Treatment/Exercise - 11/26/17 0001      Exercises   Exercises  Knee/Hip  Knee/Hip Exercises: Stretches   Active Hamstring Stretch  Right;3 reps;30 seconds    Knee: Self-Stretch to increase Flexion  Right;3 reps;30 seconds on step      Knee/Hip Exercises: Aerobic   Nustep  Nustep Level 3, seat 12-11 to improve flexion x15 minutes      Knee/Hip Exercises: Standing   Hip Flexion  Both;Stengthening;2 sets;10 reps;Knee bent    Forward Step Up  Right;20 reps;Hand Hold: 2;Step Height: 4"      Vasopneumatic   Number Minutes Vasopneumatic   15 minutes    Vasopnuematic Location   Knee    Vasopneumatic Pressure  Medium      Manual Therapy   Manual Therapy  Passive ROM    Manual therapy comments  Spider Taping x 2 strips for edema control    Passive ROM  supine PROM to improve flexion and extension with low load stretch               PT Short Term Goals - 11/19/17 1244      PT SHORT TERM GOAL #1   Title  I with HEP    Status  On-going      PT SHORT TERM GOAL #2   Title  Improved R knee ROM -5 to 100 degrees    Status  On-going      PT SHORT TERM GOAL #3   Title  Patient to report decreased pain by 50% with ambulation    Status  On-going      PT  SHORT TERM GOAL #4   Title  Patient able to perform bed mobility independently    Status  Achieved        PT Long Term Goals - 11/26/17 1236      PT LONG TERM GOAL #1   Title  I with advanced HEP    Period  Weeks    Status  On-going    Target Date  01/07/18      PT LONG TERM GOAL #2   Title  Patient able to ambulate community distances safely without AD.    Time  8    Period  Weeks    Status  On-going amb with walker    Target Date  01/07/18      PT LONG TERM GOAL #3   Title  Patient to demo improved R knee ROM 0-120 deg to normalize gait.    Time  8    Period  Weeks    Status  On-going    Target Date  01/07/18      PT LONG TERM GOAL #4   Title  Patient to demo 4+/5 RLE hip and knee strength to improve function.    Time  8    Period  Weeks    Status  On-going    Target Date  01/07/18      PT LONG TERM GOAL #5   Title  Patient to perform ADLs with 2/10 pain or less in R knee.    Time  8    Period  Weeks    Status  On-going    Target Date  01/07/18            Plan - 11/26/17 1216    Clinical Impression Statement  Patient completed exercises with no increase of pain or discomfort.  Patient required cuing to maintain a straight knee during standing hamstring stretch. Patient able to perform correctly after cuing. Patient continues to have edema which limited ROM; pt instructed  to continue to elevate an ice throughout day for edema control. HEP provided of hamstring stretch (supine and standing) and standing knee flexion stretch. Patient and husband reported understanding. No adverse affects noted upon removal of modalities. Taping for edema provided at end of session. Goals ongoing. Recertification sent to MD requesting 12 additional visits to address ROM, strength, and goals.     Clinical Presentation  Stable    Clinical Decision Making  Low    Rehab Potential  Excellent    PT Frequency  3x / week    PT Duration  4 weeks    PT Treatment/Interventions  ADLs/Self  Care Home Management;Cryotherapy;Occupational psychologist;Therapeutic exercise;Manual techniques;Patient/family education;Neuromuscular re-education;Passive range of motion;Taping;Vasopneumatic Device    PT Next Visit Plan  Cont with POC for ROM, strengthening and taping for edema. modalities PRN,(MD appt F/U next week 12/07/17)    Consulted and Agree with Plan of Care  Patient    Family Member Consulted  Husband       Patient will benefit from skilled therapeutic intervention in order to improve the following deficits and impairments:  Abnormal gait, Increased edema, Pain, Decreased range of motion, Impaired flexibility, Decreased strength  Visit Diagnosis: Stiffness of right knee, not elsewhere classified - Plan: PT plan of care cert/re-cert  Localized edema - Plan: PT plan of care cert/re-cert  Muscle weakness (generalized) - Plan: PT plan of care cert/re-cert  Acute pain of right knee - Plan: PT plan of care cert/re-cert     Problem List Patient Active Problem List   Diagnosis Date Noted  . OA (osteoarthritis) of knee 10/25/2017  . Well woman exam with routine gynecological exam 06/16/2017  . Vitamin D deficiency 03/25/2016   Gabriela Eves, PT, DPT 11/26/2017, 12:39 PM  [facility] [address] [place], [place], [address] Phone: [phone]   Fax:  [phone]  Name: Sarah Hicks MRN: [id number] Date of Birth: 07-07-1961

## 2017-11-29 ENCOUNTER — Encounter: Payer: Self-pay | Admitting: Physical Therapy

## 2017-11-29 ENCOUNTER — Ambulatory Visit: Payer: 59 | Admitting: Physical Therapy

## 2017-11-29 DIAGNOSIS — M25661 Stiffness of right knee, not elsewhere classified: Secondary | ICD-10-CM | POA: Diagnosis not present

## 2017-11-29 DIAGNOSIS — R600 Localized edema: Secondary | ICD-10-CM

## 2017-11-29 DIAGNOSIS — M6281 Muscle weakness (generalized): Secondary | ICD-10-CM

## 2017-11-29 DIAGNOSIS — M25561 Pain in right knee: Secondary | ICD-10-CM

## 2017-11-29 NOTE — Therapy (Signed)
[facility] [place], [place], [address] Phone: [phone]   Fax:  [phone]  Physical Therapy Treatment  Patient Details  Name: Sarah Hicks MRN: [id number] Date of Birth: 1961-03-01 Referring Provider: Dr. Gaynelle Arabian   Encounter Date: 11/29/2017  PT End of Session - 11/29/17 1116    Visit Number  14    Number of Visits  24    Date for PT Re-Evaluation  01/07/18    PT Start Time  1031    PT Stop Time  1129    PT Time Calculation (min)  58 min    Activity Tolerance  Patient tolerated treatment well    Behavior During Therapy  [facility] for tasks assessed/performed       Past Medical History:  Diagnosis Date  . Arthritis    oa  . Complication of anesthesia    told to breathe twice after a morton's neuroma surgery 2003, did fine with other surgeries since  . GERD (gastroesophageal reflux disease)   . Headache   . History of kidney stones    passed on own  . Hypertension   . Medical history non-contributory   . Scoliosis   . Vitamin D deficiency 03/25/2016    Past Surgical History:  Procedure Laterality Date  . ANKLE SURGERY Right 10 year ago  . cervical cryoablation  1998  . CHOLECYSTECTOMY  17 years ago  . COLONOSCOPY N/A 09/13/2013   Procedure: COLONOSCOPY;  Surgeon: Rogene Houston, MD;  [facility];  Service: Endoscopy;  Laterality: N/A;  730-moved to 21 Ann to notify pt  . FOOT SURGERY     Morton's neuroma  . KNEE SURGERY Left 10 years ago   torn ligemant and tendon repair  . TOTAL KNEE ARTHROPLASTY Right 10/25/2017   Procedure: RIGHT TOTAL KNEE ARTHROPLASTY;  Surgeon: Gaynelle Arabian, MD;  [facility];  Service: Orthopedics;  Laterality: Right;  Adductor Block  . TUBAL LIGATION  19 years ago    There were no vitals filed for this visit.  Subjective Assessment - 11/29/17 1037    Subjective  Patient reported "feeling good". Patient also feels she has been progressing well the past  week.    Pertinent History  R ankle surgery, L knee surgery    Patient Stated Goals  to get back to walking without pain    Currently in Pain?  Yes    Pain Score  2     Pain Location  Knee    Pain Orientation  Right    Pain Descriptors / Indicators  Discomfort;Tightness    Pain Type  Surgical pain    Pain Onset  More than a month ago    Pain Frequency  Intermittent    Aggravating Factors   bending    Pain Relieving Factors  rest         [facility] PT Assessment - 11/29/17 0001      AROM   AROM Assessment Site  Knee    Right/Left Knee  Right    Right Knee Flexion  88      PROM   PROM Assessment Site  Knee    Right/Left Knee  Right    Right Knee Extension  0    Right Knee Flexion  101                  [facility] Adult PT Treatment/Exercise - 11/29/17 0001      Knee/Hip Exercises: Machines for Strengthening  Cybex Knee Extension  10# half range then flexion stretch/hold  x15      Knee/Hip Exercises: Standing   SLS  SLS x4      Knee/Hip Exercises: Seated   Long Arc Quad  Strengthening;Right;3 sets;10 reps;Weights    Long Arc Quad Weight  4 lbs.      Vasopneumatic   Number Minutes Vasopneumatic   15 minutes    Vasopnuematic Location   Knee    Vasopneumatic Pressure  Medium      Manual Therapy   Manual Therapy  Passive ROM    Passive ROM  supine PROM to improve flexion and extension with low load stretch               PT Short Term Goals - 11/29/17 1125      PT SHORT TERM GOAL #1   Title  I with HEP    Time  4    Period  Weeks    Status  Achieved 11/29/17      PT SHORT TERM GOAL #2   Title  Improved R knee ROM -5 to 100 degrees    Time  4    Period  Weeks    Status  Achieved 11/29/17      PT SHORT TERM GOAL #3   Title  Patient to report decreased pain by 50% with ambulation    Time  4    Period  Weeks    Status  Achieved 11/29/17      PT SHORT TERM GOAL #4   Title  Patient able to perform bed mobility independently    Time  4    Period   Weeks    Status  Achieved        PT Long Term Goals - 11/26/17 1236      PT LONG TERM GOAL #1   Title  I with advanced HEP    Period  Weeks    Status  On-going    Target Date  01/07/18      PT LONG TERM GOAL #2   Title  Patient able to ambulate community distances safely without AD.    Time  8    Period  Weeks    Status  On-going amb with walker    Target Date  01/07/18      PT LONG TERM GOAL #3   Title  Patient to demo improved R knee ROM 0-120 deg to normalize gait.    Time  8    Period  Weeks    Status  On-going    Target Date  01/07/18      PT LONG TERM GOAL #4   Title  Patient to demo 4+/5 RLE hip and knee strength to improve function.    Time  8    Period  Weeks    Status  On-going    Target Date  01/07/18      PT LONG TERM GOAL #5   Title  Patient to perform ADLs with 2/10 pain or less in R knee.    Time  8    Period  Weeks    Status  On-going    Target Date  01/07/18            Plan - 11/29/17 1117    Clinical Impression Statement  Patient tolerated treatment well today and progressing well overall. Patient continues to improve ROM and doing HEP daily. Patient has ongoing edema which restrics ROM. Patient able to progress  exercises today and has some ongoing ext lag. STG's all met today and LTGoals ongoing.     Rehab Potential  Excellent    PT Frequency  3x / week    PT Duration  4 weeks    PT Treatment/Interventions  ADLs/Self Care Home Management;Cryotherapy;Occupational psychologist;Therapeutic exercise;Manual techniques;Patient/family education;Neuromuscular re-education;Passive range of motion;Taping;Vasopneumatic Device    PT Next Visit Plan  Cont with POC for ROM, strengthening and taping for edema. modalities PRN,(MD appt F/U next week Alusio 12/07/17)    Consulted and Agree with Plan of Care  Patient       Patient will benefit from skilled therapeutic intervention in order to improve the following deficits and  impairments:  Abnormal gait, Increased edema, Pain, Decreased range of motion, Impaired flexibility, Decreased strength  Visit Diagnosis: Stiffness of right knee, not elsewhere classified  Localized edema  Muscle weakness (generalized)  Acute pain of right knee     Problem List Patient Active Problem List   Diagnosis Date Noted  . OA (osteoarthritis) of knee 10/25/2017  . Well woman exam with routine gynecological exam 06/16/2017  . Vitamin D deficiency 03/25/2016    Phillips Climes, PTA 11/29/2017, 11:34 AM  [facility] [place], [place], [address] Phone: [phone]   Fax:  [phone]  Name: Sarah Hicks MRN: [id number] Date of Birth: 1960/11/19

## 2017-12-01 ENCOUNTER — Encounter: Payer: 59 | Admitting: Physical Therapy

## 2017-12-02 ENCOUNTER — Ambulatory Visit: Payer: 59 | Admitting: Physical Therapy

## 2017-12-02 ENCOUNTER — Encounter: Payer: Self-pay | Admitting: Physical Therapy

## 2017-12-02 DIAGNOSIS — R600 Localized edema: Secondary | ICD-10-CM

## 2017-12-02 DIAGNOSIS — M25561 Pain in right knee: Secondary | ICD-10-CM

## 2017-12-02 DIAGNOSIS — M6281 Muscle weakness (generalized): Secondary | ICD-10-CM

## 2017-12-02 DIAGNOSIS — M25661 Stiffness of right knee, not elsewhere classified: Secondary | ICD-10-CM | POA: Diagnosis not present

## 2017-12-02 NOTE — Therapy (Signed)
[facility] [place], [place], [address] Phone: [phone]   Fax:  [phone]  Physical Therapy Treatment  Patient Details  Name: Sarah Hicks MRN: [id number] Date of Birth: [birth year] Referring Provider: Dr. Gaynelle Arabian   Encounter Date: 12/02/2017  PT End of Session - 12/02/17 1158    Visit Number  15    Number of Visits  24    Date for PT Re-Evaluation  01/07/18    PT Start Time  1116    PT Stop Time  1227    PT Time Calculation (min)  71 min    Activity Tolerance  Patient tolerated treatment well    Behavior During Therapy  [facility] for tasks assessed/performed       Past Medical History:  Diagnosis Date  . Arthritis    oa  . Complication of anesthesia    told to breathe twice after a morton's neuroma surgery 2003, did fine with other surgeries since  . GERD (gastroesophageal reflux disease)   . Headache   . History of kidney stones    passed on own  . Hypertension   . Medical history non-contributory   . Scoliosis   . Vitamin D deficiency 03/25/2016    Past Surgical History:  Procedure Laterality Date  . ANKLE SURGERY Right 10 year ago  . cervical cryoablation  1998  . CHOLECYSTECTOMY  17 years ago  . COLONOSCOPY N/A 09/13/2013   Procedure: COLONOSCOPY;  Surgeon: Rogene Houston, MD;  [facility];  Service: Endoscopy;  Laterality: N/A;  730-moved to 80 Ann to notify pt  . FOOT SURGERY     Morton's neuroma  . KNEE SURGERY Left 10 years ago   torn ligemant and tendon repair  . TOTAL KNEE ARTHROPLASTY Right 10/25/2017   Procedure: RIGHT TOTAL KNEE ARTHROPLASTY;  Surgeon: Gaynelle Arabian, MD;  [facility];  Service: Orthopedics;  Laterality: Right;  Adductor Block  . TUBAL LIGATION  19 years ago    There were no vitals filed for this visit.  Subjective Assessment - 12/02/17 1122    Subjective  No new complaints    Pertinent History  R ankle surgery, L knee surgery    Patient Stated  Goals  to get back to walking without pain    Currently in Pain?  Yes    Pain Score  2     Pain Location  Knee    Pain Orientation  Right    Pain Descriptors / Indicators  Tightness    Pain Type  Surgical pain    Pain Onset  More than a month ago    Pain Frequency  Intermittent    Aggravating Factors   ROM    Pain Relieving Factors  rest         [facility] PT Assessment - 12/02/17 0001      AROM   AROM Assessment Site  Knee    Right/Left Knee  Right    Right Knee Flexion  90      PROM   PROM Assessment Site  Knee    Right/Left Knee  Right    Right Knee Extension  0    Right Knee Flexion  105                  [facility] Adult PT Treatment/Exercise - 12/02/17 0001      Ambulation/Gait   Ambulation/Gait  Yes    Ambulation/Gait Assistance  6: Modified independent (Device/Increase time)  Ambulation Distance (Feet)  75 Feet    Assistive device  Straight cane    Gait Pattern  Step-to pattern;Decreased stance time - right    Ambulation Surface  Level;Indoor      Knee/Hip Exercises: Machines for Strengthening   Cybex Knee Extension  10# half range then flexion stretch/hold  x20    Cybex Knee Flexion  20# x20      Knee/Hip Exercises: Standing   Lateral Step Up  Right;10 reps;1 set;Step Height: 4";Limitations    Lateral Step Up Limitations  difficulty    Forward Step Up  Right;1 set;10 reps;Step Height: 4";Limitations    Forward Step Up Limitations  difficulty      Electrical Stimulation   Electrical Stimulation Location  right VMO/quad    Electrical Stimulation Action  VMS    Electrical Stimulation Parameters  10/10 x 2min with quad set for activation    Electrical Stimulation Goals  Neuromuscular facilitation      Vasopneumatic   Number Minutes Vasopneumatic   15 minutes    Vasopnuematic Location   Knee    Vasopneumatic Pressure  Medium      Manual Therapy   Manual Therapy  Passive ROM;Soft tissue mobilization    Manual therapy comments  Spider Taping x 2  strips for edema control    Soft tissue mobilization  scar massage and STW on scar and surrounding knee    Passive ROM  supine PROM to improve flexion and extension with low load stretch               PT Short Term Goals - 11/29/17 1125      PT SHORT TERM GOAL #1   Title  I with HEP    Time  4    Period  Weeks    Status  Achieved 11/29/17      PT SHORT TERM GOAL #2   Title  Improved R knee ROM -5 to 100 degrees    Time  4    Period  Weeks    Status  Achieved 11/29/17      PT SHORT TERM GOAL #3   Title  Patient to report decreased pain by 50% with ambulation    Time  4    Period  Weeks    Status  Achieved 11/29/17      PT SHORT TERM GOAL #4   Title  Patient able to perform bed mobility independently    Time  4    Period  Weeks    Status  Achieved        PT Long Term Goals - 11/26/17 1236      PT LONG TERM GOAL #1   Title  I with advanced HEP    Period  Weeks    Status  On-going    Target Date  01/07/18      PT LONG TERM GOAL #2   Title  Patient able to ambulate community distances safely without AD.    Time  8    Period  Weeks    Status  On-going amb with walker    Target Date  01/07/18      PT LONG TERM GOAL #3   Title  Patient to demo improved R knee ROM 0-120 deg to normalize gait.    Time  8    Period  Weeks    Status  On-going    Target Date  01/07/18      PT LONG TERM GOAL #4  Title  Patient to demo 4+/5 RLE hip and knee strength to improve function.    Time  8    Period  Weeks    Status  On-going    Target Date  01/07/18      PT LONG TERM GOAL #5   Title  Patient to perform ADLs with 2/10 pain or less in R knee.    Time  8    Period  Weeks    Status  On-going    Target Date  01/07/18            Plan - 12/02/17 1200    Clinical Impression Statement  Patient tolerated treatment well today. Patient improved with ROM today active and passive. Patient able to start cane today in therapy and patient performed with correct technique.  Patient continues to have lack of quad control and doing well with VMS. Goals progressing.     Rehab Potential  Excellent    PT Frequency  3x / week    PT Duration  4 weeks    PT Treatment/Interventions  ADLs/Self Care Home Management;Cryotherapy;Occupational psychologist;Therapeutic exercise;Manual techniques;Patient/family education;Neuromuscular re-education;Passive range of motion;Taping;Vasopneumatic Device    PT Next Visit Plan  Cont with POC for ROM, strengthening and taping for edema. modalities PRN,(MD appt F/U next week Al[facility]io 12/07/17)    Consulted and Agree with Plan of Care  Patient       Patient will benefit from skilled therapeutic intervention in order to improve the following deficits and impairments:  Abnormal gait, Increased edema, Pain, Decreased range of motion, Impaired flexibility, Decreased strength  Visit Diagnosis: Stiffness of right knee, not elsewhere classified  Localized edema  Muscle weakness (generalized)  Acute pain of right knee     Problem List Patient Active Problem List   Diagnosis Date Noted  . OA (osteoarthritis) of knee 10/25/2017  . Well woman exam with routine gynecological exam 06/16/2017  . Vitamin D deficiency 03/25/2016    Phillips Climes, PTA 12/02/2017, 12:27 PM  [facility] [place], [place], [address] Phone: [phone]   Fax:  [phone]  Name: LATOSHIA MONRROY MRN: [id number] Date of Birth: May 09, 1961

## 2017-12-03 ENCOUNTER — Ambulatory Visit: Payer: 59 | Admitting: Physical Therapy

## 2017-12-03 DIAGNOSIS — R600 Localized edema: Secondary | ICD-10-CM

## 2017-12-03 DIAGNOSIS — M25661 Stiffness of right knee, not elsewhere classified: Secondary | ICD-10-CM

## 2017-12-03 DIAGNOSIS — M6281 Muscle weakness (generalized): Secondary | ICD-10-CM

## 2017-12-03 NOTE — Therapy (Signed)
[facility] [place], [place], [address] Phone: [phone]   Fax:  [phone]  Physical Therapy Treatment  Patient Details  Name: Sarah Hicks MRN: [id number] Date of Birth: [birth year] Referring Provider: Dr. Gaynelle Arabian   Encounter Date: 12/03/2017  PT End of Session - 12/03/17 1046    Visit Number  16    Number of Visits  24    Date for PT Re-Evaluation  01/07/18    PT Start Time  1035    PT Stop Time  1138    PT Time Calculation (min)  63 min    Activity Tolerance  Patient tolerated treatment well    Behavior During Therapy  [facility] for tasks assessed/performed       Past Medical History:  Diagnosis Date  . Arthritis    oa  . Complication of anesthesia    told to breathe twice after a morton's neuroma surgery 2003, did fine with other surgeries since  . GERD (gastroesophageal reflux disease)   . Headache   . History of kidney stones    passed on own  . Hypertension   . Medical history non-contributory   . Scoliosis   . Vitamin D deficiency 03/25/2016    Past Surgical History:  Procedure Laterality Date  . ANKLE SURGERY Right 10 year ago  . cervical cryoablation  1998  . CHOLECYSTECTOMY  17 years ago  . COLONOSCOPY N/A 09/13/2013   Procedure: COLONOSCOPY;  Surgeon: Rogene Houston, MD;  [facility];  Service: Endoscopy;  Laterality: N/A;  730-moved to 76 Ann to notify pt  . FOOT SURGERY     Morton's neuroma  . KNEE SURGERY Left 10 years ago   torn ligemant and tendon repair  . TOTAL KNEE ARTHROPLASTY Right 10/25/2017   Procedure: RIGHT TOTAL KNEE ARTHROPLASTY;  Surgeon: Gaynelle Arabian, MD;  Location: [facility];  Service: Orthopedics;  Laterality: Right;  Adductor Block  . TUBAL LIGATION  19 years ago    There were no vitals filed for this visit.  Subjective Assessment - 12/03/17 1211    Subjective  Patient stated reported no new complaints. Patient states she continues to perform HEP  regularly.    Patient is accompained by:  Family member    Pertinent History  R ankle surgery, L knee surgery    Patient Stated Goals  to get back to walking without pain    Currently in Pain?  Yes    Pain Score  2     Pain Location  Knee    Pain Orientation  Right    Pain Descriptors / Indicators  Tightness    Pain Onset  More than a month ago    Pain Frequency  Intermittent                      [facility] Adult PT Treatment/Exercise - 12/03/17 0001      Knee/Hip Exercises: Machines for Strengthening   Cybex Knee Flexion  20# x30      Knee/Hip Exercises: Seated   Long Arc Quad  Strengthening;Right;4 sets;10 reps;Weights 3#      Acupuncturist Location  Right knee    Electrical Stimulation Action  Pre-mod    Electrical Stimulation Parameters  80-150 hz x10    Electrical Stimulation Goals  Pain;Edema      Vasopneumatic   Number Minutes Vasopneumatic   10 minutes    Vasopnuematic Location   Knee  Vasopneumatic Pressure  Medium      Manual Therapy   Manual Therapy  Joint mobilization    Manual therapy comments  Spider Taping x 2 strips for edema control    Joint Mobilization  patella mobilizations inferior/superior lateral/medial to improve flexion and extension    Soft tissue mobilization  scar massage and STW on scar and surrounding knee    Passive ROM  supine PROM to improve flexion and extension with low load stretch               PT Short Term Goals - 11/29/17 1125      PT SHORT TERM GOAL #1   Title  I with HEP    Time  4    Period  Weeks    Status  Achieved 11/29/17      PT SHORT TERM GOAL #2   Title  Improved R knee ROM -5 to 100 degrees    Time  4    Period  Weeks    Status  Achieved 11/29/17      PT SHORT TERM GOAL #3   Title  Patient to report decreased pain by 50% with ambulation    Time  4    Period  Weeks    Status  Achieved 11/29/17      PT SHORT TERM GOAL #4   Title  Patient able to perform bed  mobility independently    Time  4    Period  Weeks    Status  Achieved        PT Long Term Goals - 11/26/17 1236      PT LONG TERM GOAL #1   Title  I with advanced HEP    Period  Weeks    Status  On-going    Target Date  01/07/18      PT LONG TERM GOAL #2   Title  Patient able to ambulate community distances safely without AD.    Time  8    Period  Weeks    Status  On-going amb with walker    Target Date  01/07/18      PT LONG TERM GOAL #3   Title  Patient to demo improved R knee ROM 0-120 deg to normalize gait.    Time  8    Period  Weeks    Status  On-going    Target Date  01/07/18      PT LONG TERM GOAL #4   Title  Patient to demo 4+/5 RLE hip and knee strength to improve function.    Time  8    Period  Weeks    Status  On-going    Target Date  01/07/18      PT LONG TERM GOAL #5   Title  Patient to perform ADLs with 2/10 pain or less in R knee.    Time  8    Period  Weeks    Status  On-going    Target Date  01/07/18            Plan - 12/03/17 1203    Clinical Impression Statement  Patient tolerated treatment well today. Patient continues to make improvements with AROM and strength. Patient instructed to perform scar massage and edema massage to address edema and scar tissue mobility. No adverse affects noted upon removal of modalities.    Clinical Presentation  Stable    Clinical Decision Making  Low    Rehab Potential  Excellent  PT Frequency  3x / week    PT Duration  4 weeks    PT Treatment/Interventions  ADLs/Self Care Home Management;Cryotherapy;Occupational psychologist;Therapeutic exercise;Manual techniques;Patient/family education;Neuromuscular re-education;Passive range of motion;Taping;Vasopneumatic Device    PT Next Visit Plan  Cont with POC for ROM, strengthening and taping for edema. modalities PRN,(MD appt F/U next week Alusio 12/07/17)    Consulted and Agree with Plan of Care  Patient    Family Member Consulted   Husband       Patient will benefit from skilled therapeutic intervention in order to improve the following deficits and impairments:  Abnormal gait, Increased edema, Pain, Decreased range of motion, Impaired flexibility, Decreased strength  Visit Diagnosis: Stiffness of right knee, not elsewhere classified  Localized edema  Muscle weakness (generalized)     Problem List Patient Active Problem List   Diagnosis Date Noted  . OA (osteoarthritis) of knee 10/25/2017  . Well woman exam with routine gynecological exam 06/16/2017  . Vitamin D deficiency 03/25/2016   Gabriela Eves, PT, DPT 12/03/2017, 12:15 PM  [facility] [address] [place], [place], [address] Phone: [phone]   Fax:  [phone]  Name: Sarah Hicks MRN: [id number] Date of Birth: April 25, 1961

## 2017-12-06 ENCOUNTER — Encounter: Payer: Self-pay | Admitting: Physical Therapy

## 2017-12-06 ENCOUNTER — Ambulatory Visit: Payer: 59 | Admitting: Physical Therapy

## 2017-12-06 DIAGNOSIS — M25661 Stiffness of right knee, not elsewhere classified: Secondary | ICD-10-CM

## 2017-12-06 DIAGNOSIS — R600 Localized edema: Secondary | ICD-10-CM

## 2017-12-06 DIAGNOSIS — M25561 Pain in right knee: Secondary | ICD-10-CM

## 2017-12-06 DIAGNOSIS — M6281 Muscle weakness (generalized): Secondary | ICD-10-CM

## 2017-12-06 NOTE — Therapy (Signed)
[facility] [place], [place], [address] Phone: [phone]   Fax:  [phone]  Physical Therapy Treatment  Patient Details  Name: Sarah Hicks MRN: [id number] Date of Birth: 1961/04/27 Referring Provider: Dr. Gaynelle Arabian   Encounter Date: 12/06/2017  PT End of Session - 12/06/17 1109    Visit Number  17    Number of Visits  24    Date for PT Re-Evaluation  01/07/18    PT Start Time  1031    PT Stop Time  1128    PT Time Calculation (min)  57 min    Activity Tolerance  Patient tolerated treatment well    Behavior During Therapy  [facility] for tasks assessed/performed       Past Medical History:  Diagnosis Date  . Arthritis    oa  . Complication of anesthesia    told to breathe twice after a morton's neuroma surgery 2003, did fine with other surgeries since  . GERD (gastroesophageal reflux disease)   . Headache   . History of kidney stones    passed on own  . Hypertension   . Medical history non-contributory   . Scoliosis   . Vitamin D deficiency 03/25/2016    Past Surgical History:  Procedure Laterality Date  . ANKLE SURGERY Right 10 year ago  . cervical cryoablation  1998  . CHOLECYSTECTOMY  17 years ago  . COLONOSCOPY N/A 09/13/2013   Procedure: COLONOSCOPY;  Surgeon: Rogene Houston, MD;  [facility];  Service: Endoscopy;  Laterality: N/A;  730-moved to 22 Ann to notify pt  . FOOT SURGERY     Morton's neuroma  . KNEE SURGERY Left 10 years ago   torn ligemant and tendon repair  . TOTAL KNEE ARTHROPLASTY Right 10/25/2017   Procedure: RIGHT TOTAL KNEE ARTHROPLASTY;  Surgeon: Gaynelle Arabian, MD;  Location: [facility];  Service: Orthopedics;  Laterality: Right;  Adductor Block  . TUBAL LIGATION  19 years ago    There were no vitals filed for this visit.  Subjective Assessment - 12/06/17 1038    Subjective  Patient arrived with some ongoing discomfort and tightness yet progress overall    Patient  is accompained by:  Family member    Pertinent History  R ankle surgery, L knee surgery    Patient Stated Goals  to get back to walking without pain    Currently in Pain?  Yes    Pain Score  2     Pain Location  Knee    Pain Orientation  Right    Pain Descriptors / Indicators  Tightness    Pain Type  Surgical pain    Pain Onset  More than a month ago    Pain Frequency  Intermittent    Aggravating Factors   ROM    Pain Relieving Factors  rest         [facility] PT Assessment - 12/06/17 0001      AROM   AROM Assessment Site  Knee    Right/Left Knee  Right    Right Knee Extension  -2    Right Knee Flexion  92      PROM   PROM Assessment Site  Knee    Right/Left Knee  Right    Right Knee Extension  0    Right Knee Flexion  105                  [facility] Adult PT Treatment/Exercise -  12/06/17 0001      Knee/Hip Exercises: Aerobic   Recumbent Bike  47min       Knee/Hip Exercises: Standing   Rocker Board  2 minutes      Knee/Hip Exercises: Supine   Short Arc Quad Sets  Strengthening;Right;3 sets;10 reps;Limitations    Short Arc Quad Sets Limitations  3# with ball squeeze      Vasopneumatic   Number Minutes Vasopneumatic   15 minutes    Vasopnuematic Location   Knee    Vasopneumatic Pressure  Medium      Manual Therapy   Manual Therapy  Passive ROM    Joint Mobilization  patella mobilizations inferior/superior lateral/medial to improve flexion and extension    Soft tissue mobilization  scar massage and STW on scar and surrounding knee    Passive ROM  supine PROM to improve flexion and extension with low load stretch               PT Short Term Goals - 11/29/17 1125      PT SHORT TERM GOAL #1   Title  I with HEP    Time  4    Period  Weeks    Status  Achieved 11/29/17      PT SHORT TERM GOAL #2   Title  Improved R knee ROM -5 to 100 degrees    Time  4    Period  Weeks    Status  Achieved 11/29/17      PT SHORT TERM GOAL #3   Title  Patient to  report decreased pain by 50% with ambulation    Time  4    Period  Weeks    Status  Achieved 11/29/17      PT SHORT TERM GOAL #4   Title  Patient able to perform bed mobility independently    Time  4    Period  Weeks    Status  Achieved        PT Long Term Goals - 11/26/17 1236      PT LONG TERM GOAL #1   Title  I with advanced HEP    Period  Weeks    Status  On-going    Target Date  01/07/18      PT LONG TERM GOAL #2   Title  Patient able to ambulate community distances safely without AD.    Time  8    Period  Weeks    Status  On-going amb with walker    Target Date  01/07/18      PT LONG TERM GOAL #3   Title  Patient to demo improved R knee ROM 0-120 deg to normalize gait.    Time  8    Period  Weeks    Status  On-going    Target Date  01/07/18      PT LONG TERM GOAL #4   Title  Patient to demo 4+/5 RLE hip and knee strength to improve function.    Time  8    Period  Weeks    Status  On-going    Target Date  01/07/18      PT LONG TERM GOAL #5   Title  Patient to perform ADLs with 2/10 pain or less in R knee.    Time  8    Period  Weeks    Status  On-going    Target Date  01/07/18  Plan - 12/06/17 1111    Clinical Impression Statement  Patient continues to progress with decreased swelling and improved ROM in right knee each week. Patient continues to use walker due to lack of quad control in muscle. Patient progressing with right knee strengthening this week. Patient doing HEP and self stretches daily. Goals progressing. F/U with MD tomorrow.     Rehab Potential  Excellent    PT Frequency  3x / week    PT Duration  4 weeks    PT Treatment/Interventions  ADLs/Self Care Home Management;Cryotherapy;Occupational psychologist;Therapeutic exercise;Manual techniques;Patient/family education;Neuromuscular re-education;Passive range of motion;Taping;Vasopneumatic Device    PT Next Visit Plan  Cont with POC for ROM,  strengthening and taping for edema. modalities PRN,(MD Alusio 12/07/17)    Consulted and Agree with Plan of Care  Patient       Patient will benefit from skilled therapeutic intervention in order to improve the following deficits and impairments:  Abnormal gait, Increased edema, Pain, Decreased range of motion, Impaired flexibility, Decreased strength  Visit Diagnosis: Stiffness of right knee, not elsewhere classified  Localized edema  Muscle weakness (generalized)  Acute pain of right knee     Problem List Patient Active Problem List   Diagnosis Date Noted  . OA (osteoarthritis) of knee 10/25/2017  . Well woman exam with routine gynecological exam 06/16/2017  . Vitamin D deficiency 03/25/2016    Ladean Raya, PTA 12/06/17 11:32 AM  [facility] [place], [place], [address] Phone: [phone]   Fax:  [phone]  Name: LANESHIA PINA MRN: [id number] Date of Birth: [birth year]

## 2017-12-08 ENCOUNTER — Encounter: Payer: Self-pay | Admitting: Physical Therapy

## 2017-12-08 ENCOUNTER — Ambulatory Visit: Payer: 59 | Admitting: Physical Therapy

## 2017-12-08 DIAGNOSIS — M25561 Pain in right knee: Secondary | ICD-10-CM

## 2017-12-08 DIAGNOSIS — M6281 Muscle weakness (generalized): Secondary | ICD-10-CM

## 2017-12-08 DIAGNOSIS — M25661 Stiffness of right knee, not elsewhere classified: Secondary | ICD-10-CM | POA: Diagnosis not present

## 2017-12-08 DIAGNOSIS — R600 Localized edema: Secondary | ICD-10-CM

## 2017-12-08 NOTE — Therapy (Signed)
[facility] [place], [place], [address] Phone: [phone]   Fax:  [phone]  Physical Therapy Treatment  Patient Details  Name: Sarah Hicks MRN: [id number] Date of Birth: 1961-04-25 Referring Provider: Dr. Gaynelle Arabian   Encounter Date: 12/08/2017  PT End of Session - 12/08/17 1123    Visit Number  18    Number of Visits  24    Date for PT Re-Evaluation  01/07/18    PT Start Time  1030    PT Stop Time  1130    PT Time Calculation (min)  60 min    Activity Tolerance  Patient tolerated treatment well    Behavior During Therapy  [facility] for tasks assessed/performed       Past Medical History:  Diagnosis Date  . Arthritis    oa  . Complication of anesthesia    told to breathe twice after a morton's neuroma surgery 2003, did fine with other surgeries since  . GERD (gastroesophageal reflux disease)   . Headache   . History of kidney stones    passed on own  . Hypertension   . Medical history non-contributory   . Scoliosis   . Vitamin D deficiency 03/25/2016    Past Surgical History:  Procedure Laterality Date  . ANKLE SURGERY Right 10 year ago  . cervical cryoablation  1998  . CHOLECYSTECTOMY  17 years ago  . COLONOSCOPY N/A 09/13/2013   Procedure: COLONOSCOPY;  Surgeon: Rogene Houston, MD;  [facility];  Service: Endoscopy;  Laterality: N/A;  730-moved to 27 Ann to notify pt  . FOOT SURGERY     Morton's neuroma  . KNEE SURGERY Left 10 years ago   torn ligemant and tendon repair  . TOTAL KNEE ARTHROPLASTY Right 10/25/2017   Procedure: RIGHT TOTAL KNEE ARTHROPLASTY;  Surgeon: Gaynelle Arabian, MD;  Location: [facility];  Service: Orthopedics;  Laterality: Right;  Adductor Block  . TUBAL LIGATION  19 years ago    There were no vitals filed for this visit.  Subjective Assessment - 12/08/17 1037    Subjective  Patient reported going to MD and had good report from MD and is to cont therapy    Pertinent History  R ankle surgery, L knee surgery    Patient Stated Goals  to get back to walking without pain    Currently in Pain?  Yes    Pain Score  2     Pain Location  Knee    Pain Orientation  Right    Pain Descriptors / Indicators  Tightness    Pain Type  Surgical pain    Pain Onset  More than a month ago    Pain Frequency  Intermittent    Aggravating Factors   ROM     Pain Relieving Factors  rest         [facility] PT Assessment - 12/08/17 0001      AROM   AROM Assessment Site  Knee    Right/Left Knee  Right    Right Knee Flexion  93      PROM   PROM Assessment Site  Knee    Right/Left Knee  Right    Right Knee Flexion  105                  [facility] Adult PT Treatment/Exercise - 12/08/17 0001      Knee/Hip Exercises: Aerobic   Nustep  x90min L4 LE only  Knee/Hip Exercises: Machines for Strengthening   Cybex Knee Flexion  20# x30      Knee/Hip Exercises: Standing   Forward Step Up  Right;20 reps;10 reps;Step Height: 6"    Rocker Board  2 minutes      Knee/Hip Exercises: Seated   Long Arc Quad  20 reps;10 reps;Right;Strengthening    Long Arc Quad Weight  4 lbs.      Knee/Hip Exercises: Supine   Short Arc Quad Sets  Strengthening;Right;3 sets;10 reps;Limitations    Short Arc Quad Sets Limitations  4#      Knee/Hip Exercises: Sidelying   Hip ABduction  Strengthening;Right;10 reps;Limitations    Hip ABduction Limitations  educational cues for technique      Vasopneumatic   Number Minutes Vasopneumatic   15 minutes    Vasopnuematic Location   Knee    Vasopneumatic Pressure  Medium      Manual Therapy   Manual Therapy  Passive ROM;Soft tissue mobilization;Joint mobilization    Manual therapy comments  Spider Taping x 2 strips for edema control    Joint Mobilization  patella mobilizations inferior/superior lateral/medial to improve flexion and extension    Soft tissue mobilization  scar massage and STW on scar and surrounding knee    Passive ROM   supine PROM to improve flexion and extension with low load stretch               PT Short Term Goals - 11/29/17 1125      PT SHORT TERM GOAL #1   Title  I with HEP    Time  4    Period  Weeks    Status  Achieved 11/29/17      PT SHORT TERM GOAL #2   Title  Improved R knee ROM -5 to 100 degrees    Time  4    Period  Weeks    Status  Achieved 11/29/17      PT SHORT TERM GOAL #3   Title  Patient to report decreased pain by 50% with ambulation    Time  4    Period  Weeks    Status  Achieved 11/29/17      PT SHORT TERM GOAL #4   Title  Patient able to perform bed mobility independently    Time  4    Period  Weeks    Status  Achieved        PT Long Term Goals - 11/26/17 1236      PT LONG TERM GOAL #1   Title  I with advanced HEP    Period  Weeks    Status  On-going    Target Date  01/07/18      PT LONG TERM GOAL #2   Title  Patient able to ambulate community distances safely without AD.    Time  8    Period  Weeks    Status  On-going amb with walker    Target Date  01/07/18      PT LONG TERM GOAL #3   Title  Patient to demo improved R knee ROM 0-120 deg to normalize gait.    Time  8    Period  Weeks    Status  On-going    Target Date  01/07/18      PT LONG TERM GOAL #4   Title  Patient to demo 4+/5 RLE hip and knee strength to improve function.    Time  8    Period  Weeks    Status  On-going    Target Date  01/07/18      PT LONG TERM GOAL #5   Title  Patient to perform ADLs with 2/10 pain or less in R knee.    Time  8    Period  Weeks    Status  On-going    Target Date  01/07/18            Plan - 12/08/17 1124    Clinical Impression Statement  Patient tolerated treatment well today. Patient continues to progress with transition from walker to cane with good technique. Patinent improving with ROM in right kneee with ongoing limitations due to edema. Patient able to progress right knee strengthening exericses today. Goals progressing      Rehab Potential  Excellent    PT Frequency  3x / week    PT Duration  4 weeks    PT Treatment/Interventions  ADLs/Self Care Home Management;Cryotherapy;Occupational psychologist;Therapeutic exercise;Manual techniques;Patient/family education;Neuromuscular re-education;Passive range of motion;Taping;Vasopneumatic Device    PT Next Visit Plan  Cont with POC for ROM, strengthening and taping for edema. modalities PRN    Consulted and Agree with Plan of Care  Patient       Patient will benefit from skilled therapeutic intervention in order to improve the following deficits and impairments:  Abnormal gait, Increased edema, Pain, Decreased range of motion, Impaired flexibility, Decreased strength  Visit Diagnosis: Stiffness of right knee, not elsewhere classified  Localized edema  Muscle weakness (generalized)  Acute pain of right knee     Problem List Patient Active Problem List   Diagnosis Date Noted  . OA (osteoarthritis) of knee 10/25/2017  . Well woman exam with routine gynecological exam 06/16/2017  . Vitamin D deficiency 03/25/2016    Phillips Climes, PTA 12/08/2017, 11:39 AM  [facility] [place], [place], [address] Phone: [phone]   Fax:  [phone]  Name: Sarah Hicks MRN: [id number] Date of Birth: [birth year]

## 2017-12-10 ENCOUNTER — Ambulatory Visit: Payer: 59 | Attending: Orthopedic Surgery | Admitting: Physical Therapy

## 2017-12-10 DIAGNOSIS — M25661 Stiffness of right knee, not elsewhere classified: Secondary | ICD-10-CM

## 2017-12-10 DIAGNOSIS — R600 Localized edema: Secondary | ICD-10-CM | POA: Insufficient documentation

## 2017-12-10 DIAGNOSIS — M25561 Pain in right knee: Secondary | ICD-10-CM | POA: Diagnosis present

## 2017-12-10 DIAGNOSIS — M6281 Muscle weakness (generalized): Secondary | ICD-10-CM | POA: Insufficient documentation

## 2017-12-10 NOTE — Therapy (Signed)
[facility] [place], [place], [address] Phone: [phone]   Fax:  [phone]  Physical Therapy Treatment  Patient Details  Name: Sarah Hicks MRN: [id number] Date of Birth: July 25, 1961 Referring Provider: Dr. Gaynelle Arabian   Encounter Date: 12/10/2017  PT End of Session - 12/10/17 1036    Visit Number  19    Number of Visits  24    Date for PT Re-Evaluation  01/07/18    PT Start Time  1030    PT Stop Time  1128    PT Time Calculation (min)  58 min    Activity Tolerance  Patient tolerated treatment well    Behavior During Therapy  [facility] for tasks assessed/performed       Past Medical History:  Diagnosis Date  . Arthritis    oa  . Complication of anesthesia    told to breathe twice after a morton's neuroma surgery 2003, did fine with other surgeries since  . GERD (gastroesophageal reflux disease)   . Headache   . History of kidney stones    passed on own  . Hypertension   . Medical history non-contributory   . Scoliosis   . Vitamin D deficiency 03/25/2016    Past Surgical History:  Procedure Laterality Date  . ANKLE SURGERY Right 10 year ago  . cervical cryoablation  1998  . CHOLECYSTECTOMY  17 years ago  . COLONOSCOPY N/A 09/13/2013   Procedure: COLONOSCOPY;  Surgeon: Rogene Houston, MD;  [facility];  Service: Endoscopy;  Laterality: N/A;  730-moved to 50 Ann to notify pt  . FOOT SURGERY     Morton's neuroma  . KNEE SURGERY Left 10 years ago   torn ligemant and tendon repair  . TOTAL KNEE ARTHROPLASTY Right 10/25/2017   Procedure: RIGHT TOTAL KNEE ARTHROPLASTY;  Surgeon: Gaynelle Arabian, MD;  Location: [facility];  Service: Orthopedics;  Laterality: Right;  Adductor Block  . TUBAL LIGATION  19 years ago    There were no vitals filed for this visit.  Subjective Assessment - 12/10/17 1035    Subjective  Patient reported feeling "pretty good" she states she is getting more comfortable with  ambulating with a cane.    Patient is accompained by:  Family member    Pertinent History  R ankle surgery, L knee surgery    Patient Stated Goals  to get back to walking without pain    Currently in Pain?  Yes    Pain Score  2     Pain Orientation  Right    Pain Descriptors / Indicators  Tightness    Pain Type  Surgical pain    Pain Onset  More than a month ago                      [facility] Adult PT Treatment/Exercise - 12/10/17 0001      Knee/Hip Exercises: Aerobic   Nustep  x11 min L4 LE only seat 11 to seat 10 to improve flexion      Knee/Hip Exercises: Machines for Strengthening   Cybex Knee Flexion  20# 5 minutes      Knee/Hip Exercises: Seated   Long Arc Quad  Strengthening;Right;2 sets;10 reps 4 second hold at top of [employer]  4 lbs.      Vasopneumatic   Number Minutes Vasopneumatic   15 minutes    Vasopnuematic Location   Knee  Vasopneumatic Pressure  Medium      Manual Therapy   Manual Therapy  Passive ROM    Manual therapy comments  Spider Taping x 2 strips for edema control    Joint Mobilization  patella mobilizations inferior/superior lateral/medial to improve flexion and extension    Passive ROM  seated PROM to improve flexion and extension; PNF contract relax to improve flexion               PT Short Term Goals - 11/29/17 1125      PT SHORT TERM GOAL #1   Title  I with HEP    Time  4    Period  Weeks    Status  Achieved 11/29/17      PT SHORT TERM GOAL #2   Title  Improved R knee ROM -5 to 100 degrees    Time  4    Period  Weeks    Status  Achieved 11/29/17      PT SHORT TERM GOAL #3   Title  Patient to report decreased pain by 50% with ambulation    Time  4    Period  Weeks    Status  Achieved 11/29/17      PT SHORT TERM GOAL #4   Title  Patient able to perform bed mobility independently    Time  4    Period  Weeks    Status  Achieved        PT Long Term Goals - 11/26/17 1236      PT LONG TERM  GOAL #1   Title  I with advanced HEP    Period  Weeks    Status  On-going    Target Date  01/07/18      PT LONG TERM GOAL #2   Title  Patient able to ambulate community distances safely without AD.    Time  8    Period  Weeks    Status  On-going amb with walker    Target Date  01/07/18      PT LONG TERM GOAL #3   Title  Patient to demo improved R knee ROM 0-120 deg to normalize gait.    Time  8    Period  Weeks    Status  On-going    Target Date  01/07/18      PT LONG TERM GOAL #4   Title  Patient to demo 4+/5 RLE hip and knee strength to improve function.    Time  8    Period  Weeks    Status  On-going    Target Date  01/07/18      PT LONG TERM GOAL #5   Title  Patient to perform ADLs with 2/10 pain or less in R knee.    Time  8    Period  Weeks    Status  On-going    Target Date  01/07/18            Plan - 12/10/17 1217    Clinical Impression Statement  Patient tolerated treatment well today as noted by no increase of pain during ther-ex. Patient demonstrates decreased L step length and decreased R stance time. Patient and husband educated to continues ROM especially flexion HEP. Patient and husband reported understanding. Patient noted with no adverse effects upon removal of modalities.     Clinical Presentation  Stable    Clinical Decision Making  Low    Rehab Potential  Excellent  PT Frequency  3x / week    PT Duration  4 weeks    PT Treatment/Interventions  ADLs/Self Care Home Management;Cryotherapy;Occupational psychologist;Therapeutic exercise;Manual techniques;Patient/family education;Neuromuscular re-education;Passive range of motion;Taping;Vasopneumatic Device    PT Next Visit Plan  Cont with POC for ROM, strengthening and taping for edema. modalities PRN    Consulted and Agree with Plan of Care  Patient    Family Member Consulted  Husband       Patient will benefit from skilled therapeutic intervention in order to improve  the following deficits and impairments:  Abnormal gait, Increased edema, Pain, Decreased range of motion, Impaired flexibility, Decreased strength  Visit Diagnosis: Stiffness of right knee, not elsewhere classified  Localized edema  Muscle weakness (generalized)  Acute pain of right knee     Problem List Patient Active Problem List   Diagnosis Date Noted  . OA (osteoarthritis) of knee 10/25/2017  . Well woman exam with routine gynecological exam 06/16/2017  . Vitamin D deficiency 03/25/2016   Gabriela Eves, PT, DPT 12/10/2017, 12:22 PM  [facility] [address] [place], [place], [address] Phone: [phone]   Fax:  [phone]  Name: Sarah Hicks MRN: [id number] Date of Birth: 02/07/1961

## 2017-12-13 ENCOUNTER — Ambulatory Visit: Payer: 59 | Admitting: Physical Therapy

## 2017-12-13 ENCOUNTER — Encounter: Payer: Self-pay | Admitting: Physical Therapy

## 2017-12-13 DIAGNOSIS — M25661 Stiffness of right knee, not elsewhere classified: Secondary | ICD-10-CM | POA: Diagnosis not present

## 2017-12-13 DIAGNOSIS — M25561 Pain in right knee: Secondary | ICD-10-CM

## 2017-12-13 DIAGNOSIS — M6281 Muscle weakness (generalized): Secondary | ICD-10-CM

## 2017-12-13 DIAGNOSIS — R600 Localized edema: Secondary | ICD-10-CM

## 2017-12-13 NOTE — Therapy (Signed)
[facility] [place], [place], [address] Phone: [phone]   Fax:  [phone]  Physical Therapy Treatment  Patient Details  Name: Sarah Hicks MRN: [id number] Date of Birth: 1961-07-01 Referring Provider: Dr. Gaynelle Arabian   Encounter Date: 12/13/2017  PT End of Session - 12/13/17 1118    Visit Number  20    Number of Visits  24    Date for PT Re-Evaluation  01/07/18    PT Start Time  1032    PT Stop Time  1129    PT Time Calculation (min)  57 min    Activity Tolerance  Patient tolerated treatment well    Behavior During Therapy  [facility] for tasks assessed/performed       Past Medical History:  Diagnosis Date  . Arthritis    oa  . Complication of anesthesia    told to breathe twice after a morton's neuroma surgery 2003, did fine with other surgeries since  . GERD (gastroesophageal reflux disease)   . Headache   . History of kidney stones    passed on own  . Hypertension   . Medical history non-contributory   . Scoliosis   . Vitamin D deficiency 03/25/2016    Past Surgical History:  Procedure Laterality Date  . ANKLE SURGERY Right 10 year ago  . cervical cryoablation  1998  . CHOLECYSTECTOMY  17 years ago  . COLONOSCOPY N/A 09/13/2013   Procedure: COLONOSCOPY;  Surgeon: Rogene Houston, MD;  [facility];  Service: Endoscopy;  Laterality: N/A;  730-moved to 40 Ann to notify pt  . FOOT SURGERY     Morton's neuroma  . KNEE SURGERY Left 10 years ago   torn ligemant and tendon repair  . TOTAL KNEE ARTHROPLASTY Right 10/25/2017   Procedure: RIGHT TOTAL KNEE ARTHROPLASTY;  Surgeon: Gaynelle Arabian, MD;  Location: [facility];  Service: Orthopedics;  Laterality: Right;  Adductor Block  . TUBAL LIGATION  19 years ago    There were no vitals filed for this visit.  Subjective Assessment - 12/13/17 1037    Subjective  Patient reported ongoing tightness in knee and some discomfort    Pertinent History  R  ankle surgery, L knee surgery    Patient Stated Goals  to get back to walking without pain    Currently in Pain?  Yes    Pain Score  2     Pain Location  Knee    Pain Orientation  Right    Pain Descriptors / Indicators  Tightness    Pain Type  Surgical pain    Pain Onset  More than a month ago    Pain Frequency  Intermittent    Aggravating Factors   ROM    Pain Relieving Factors  rest         [facility] PT Assessment - 12/13/17 0001      AROM   AROM Assessment Site  Knee    Right/Left Knee  Right    Right Knee Extension  -2    Right Knee Flexion  98      PROM   PROM Assessment Site  Knee    Right/Left Knee  Right    Right Knee Extension  0    Right Knee Flexion  110                  [facility] Adult PT Treatment/Exercise - 12/13/17 0001      Knee/Hip Exercises: Aerobic  Stationary Bike  x24min      Knee/Hip Exercises: Machines for Strengthening   Cybex Knee Extension  10# holds for stretch and strength x25    Cybex Knee Flexion  20# eccentric control x30    Cybex Leg Press  1plt (seat 6) x30      Vasopneumatic   Number Minutes Vasopneumatic   15 minutes    Vasopnuematic Location   Knee    Vasopneumatic Pressure  High      Manual Therapy   Manual Therapy  Passive ROM    Joint Mobilization  patella mobilizations inferior/superior lateral/medial to improve flexion and extension    Soft tissue mobilization  scar massage and STW on scar and surrounding knee    Passive ROM  PROM for right knee flexion then ext with overpresure                PT Short Term Goals - 11/29/17 1125      PT SHORT TERM GOAL #1   Title  I with HEP    Time  4    Period  Weeks    Status  Achieved 11/29/17      PT SHORT TERM GOAL #2   Title  Improved R knee ROM -5 to 100 degrees    Time  4    Period  Weeks    Status  Achieved 11/29/17      PT SHORT TERM GOAL #3   Title  Patient to report decreased pain by 50% with ambulation    Time  4    Period  Weeks    Status   Achieved 11/29/17      PT SHORT TERM GOAL #4   Title  Patient able to perform bed mobility independently    Time  4    Period  Weeks    Status  Achieved        PT Long Term Goals - 11/26/17 1236      PT LONG TERM GOAL #1   Title  I with advanced HEP    Period  Weeks    Status  On-going    Target Date  01/07/18      PT LONG TERM GOAL #2   Title  Patient able to ambulate community distances safely without AD.    Time  8    Period  Weeks    Status  On-going amb with walker    Target Date  01/07/18      PT LONG TERM GOAL #3   Title  Patient to demo improved R knee ROM 0-120 deg to normalize gait.    Time  8    Period  Weeks    Status  On-going    Target Date  01/07/18      PT LONG TERM GOAL #4   Title  Patient to demo 4+/5 RLE hip and knee strength to improve function.    Time  8    Period  Weeks    Status  On-going    Target Date  01/07/18      PT LONG TERM GOAL #5   Title  Patient to perform ADLs with 2/10 pain or less in R knee.    Time  8    Period  Weeks    Status  On-going    Target Date  01/07/18            Plan - 12/13/17 1120    Clinical Impression Statement  Patient tolerated treatment  well today. Patient able to progress right knee strengtheing today and improved with active and passive ROM. Patient has decreased edema yet ongoing which causes limitations. Patient reported doing self stretches daily. Goals progressing yet ongoing.     Rehab Potential  Excellent    PT Frequency  3x / week    PT Duration  4 weeks    PT Treatment/Interventions  ADLs/Self Care Home Management;Cryotherapy;Occupational psychologist;Therapeutic exercise;Manual techniques;Patient/family education;Neuromuscular re-education;Passive range of motion;Taping;Vasopneumatic Device    PT Next Visit Plan  Cont with POC for ROM, strengthening and taping for edema. modalities PRN    Consulted and Agree with Plan of Care  Patient       Patient will benefit  from skilled therapeutic intervention in order to improve the following deficits and impairments:  Abnormal gait, Increased edema, Pain, Decreased range of motion, Impaired flexibility, Decreased strength  Visit Diagnosis: Stiffness of right knee, not elsewhere classified  Localized edema  Muscle weakness (generalized)  Acute pain of right knee     Problem List Patient Active Problem List   Diagnosis Date Noted  . OA (osteoarthritis) of knee 10/25/2017  . Well woman exam with routine gynecological exam 06/16/2017  . Vitamin D deficiency 03/25/2016    Phillips Climes, PTA 12/13/2017, 11:32 AM  [facility] [place], [place], [address] Phone: [phone]   Fax:  [phone]  Name: Sarah Hicks MRN: [id number] Date of Birth: [birth year]

## 2017-12-15 ENCOUNTER — Encounter: Payer: Self-pay | Admitting: Physical Therapy

## 2017-12-15 ENCOUNTER — Ambulatory Visit: Payer: 59 | Admitting: Physical Therapy

## 2017-12-15 DIAGNOSIS — R600 Localized edema: Secondary | ICD-10-CM

## 2017-12-15 DIAGNOSIS — M25561 Pain in right knee: Secondary | ICD-10-CM

## 2017-12-15 DIAGNOSIS — M25661 Stiffness of right knee, not elsewhere classified: Secondary | ICD-10-CM | POA: Diagnosis not present

## 2017-12-15 DIAGNOSIS — M6281 Muscle weakness (generalized): Secondary | ICD-10-CM

## 2017-12-15 NOTE — Therapy (Signed)
[facility] [place], [place], [address] Phone: [phone]   Fax:  [phone]  Physical Therapy Treatment  Patient Details  Name: Sarah Hicks MRN: [id number] Date of Birth: [birth year] Referring Provider: Dr. Gaynelle Arabian   Encounter Date: 12/15/2017  PT End of Session - 12/15/17 1108    Visit Number  21    Number of Visits  24    Date for PT Re-Evaluation  01/07/18    PT Start Time  1030    PT Stop Time  1129    PT Time Calculation (min)  59 min    Activity Tolerance  Patient tolerated treatment well    Behavior During Therapy  [facility] for tasks assessed/performed       Past Medical History:  Diagnosis Date  . Arthritis    oa  . Complication of anesthesia    told to breathe twice after a morton's neuroma surgery 2003, did fine with other surgeries since  . GERD (gastroesophageal reflux disease)   . Headache   . History of kidney stones    passed on own  . Hypertension   . Medical history non-contributory   . Scoliosis   . Vitamin D deficiency 03/25/2016    Past Surgical History:  Procedure Laterality Date  . ANKLE SURGERY Right 10 year ago  . cervical cryoablation  1998  . CHOLECYSTECTOMY  17 years ago  . COLONOSCOPY N/A 09/13/2013   Procedure: COLONOSCOPY;  Surgeon: Rogene Houston, MD;  [facility];  Service: Endoscopy;  Laterality: N/A;  730-moved to 15 Ann to notify pt  . FOOT SURGERY     Morton's neuroma  . KNEE SURGERY Left 10 years ago   torn ligemant and tendon repair  . TOTAL KNEE ARTHROPLASTY Right 10/25/2017   Procedure: RIGHT TOTAL KNEE ARTHROPLASTY;  Surgeon: Gaynelle Arabian, MD;  Location: [facility];  Service: Orthopedics;  Laterality: Right;  Adductor Block  . TUBAL LIGATION  19 years ago    There were no vitals filed for this visit.  Subjective Assessment - 12/15/17 1031    Subjective  Patient reported ongoing tightness in knee and some discomfort, some soreness after last  treatment    Pertinent History  R ankle surgery, L knee surgery    Patient Stated Goals  to get back to walking without pain    Currently in Pain?  Yes    Pain Score  2     Pain Location  Knee    Pain Orientation  Right    Pain Descriptors / Indicators  Tightness    Pain Type  Surgical pain    Pain Onset  More than a month ago    Pain Frequency  Intermittent    Aggravating Factors   ROM or increased activity    Pain Relieving Factors  rest and ice         [facility] PT Assessment - 12/15/17 0001      AROM   AROM Assessment Site  Knee    Right/Left Knee  Right    Right Knee Flexion  100      PROM   PROM Assessment Site  Knee    Right/Left Knee  Right    Right Knee Flexion  110                  [facility] Adult PT Treatment/Exercise - 12/15/17 0001      Knee/Hip Exercises: Aerobic   Stationary Bike  x29min  Knee/Hip Exercises: Seated   Long Arc Quad  Strengthening;Right;20 reps;10 reps;Weights    Long Arc Quad Weight  4 lbs.      Knee/Hip Exercises: Supine   Other Supine Knee/Hip Exercises  wall slides with 4# with 10sec hold x70min      Electrical Stimulation   Electrical Stimulation Location  Right knee    Electrical Stimulation Action  premod    Electrical Stimulation Parameters  1-10hz  x55min    Electrical Stimulation Goals  Pain;Edema      Vasopneumatic   Number Minutes Vasopneumatic   15 minutes    Vasopnuematic Location   Knee    Vasopneumatic Pressure  Medium      Manual Therapy   Manual Therapy  Passive ROM    Manual therapy comments  Spider Taping x 2 strips for edema control    Joint Mobilization  patella mobilizations inferior/superior lateral/medial to improve flexion and extension    Soft tissue mobilization  scar massage and STW on scar and surrounding knee    Passive ROM  PROM for right knee flexion in supine, sitting then prone to increase range               PT Short Term Goals - 11/29/17 1125      PT SHORT TERM GOAL #1    Title  I with HEP    Time  4    Period  Weeks    Status  Achieved 11/29/17      PT SHORT TERM GOAL #2   Title  Improved R knee ROM -5 to 100 degrees    Time  4    Period  Weeks    Status  Achieved 11/29/17      PT SHORT TERM GOAL #3   Title  Patient to report decreased pain by 50% with ambulation    Time  4    Period  Weeks    Status  Achieved 11/29/17      PT SHORT TERM GOAL #4   Title  Patient able to perform bed mobility independently    Time  4    Period  Weeks    Status  Achieved        PT Long Term Goals - 11/26/17 1236      PT LONG TERM GOAL #1   Title  I with advanced HEP    Period  Weeks    Status  On-going    Target Date  01/07/18      PT LONG TERM GOAL #2   Title  Patient able to ambulate community distances safely without AD.    Time  8    Period  Weeks    Status  On-going amb with walker    Target Date  01/07/18      PT LONG TERM GOAL #3   Title  Patient to demo improved R knee ROM 0-120 deg to normalize gait.    Time  8    Period  Weeks    Status  On-going    Target Date  01/07/18      PT LONG TERM GOAL #4   Title  Patient to demo 4+/5 RLE hip and knee strength to improve function.    Time  8    Period  Weeks    Status  On-going    Target Date  01/07/18      PT LONG TERM GOAL #5   Title  Patient to perform ADLs with 2/10 pain or  less in R knee.    Time  8    Period  Weeks    Status  On-going    Target Date  01/07/18            Plan - 12/15/17 1115    Clinical Impression Statement  Patient tolerated treatrment well today with some discomfort with flexion ROM at end range. Today focused on ROM for right knee flexion to increase range and functional independence. Patient using a cane at all times and was able to transition off walker with no reported difficulty. Goals progressing with ROM, strength and edema limitations.     Rehab Potential  Excellent    PT Frequency  3x / week    PT Duration  4 weeks    PT Treatment/Interventions   ADLs/Self Care Home Management;Cryotherapy;Occupational psychologist;Therapeutic exercise;Manual techniques;Patient/family education;Neuromuscular re-education;Passive range of motion;Taping;Vasopneumatic Device    PT Next Visit Plan  Cont with POC for ROM, strengthening and taping for edema. modalities PRN (MD. Maureen Ralphs 12/28/17)    Consulted and Agree with Plan of Care  Patient       Patient will benefit from skilled therapeutic intervention in order to improve the following deficits and impairments:  Abnormal gait, Increased edema, Pain, Decreased range of motion, Impaired flexibility, Decreased strength  Visit Diagnosis: Stiffness of right knee, not elsewhere classified  Localized edema  Muscle weakness (generalized)  Acute pain of right knee     Problem List Patient Active Problem List   Diagnosis Date Noted  . OA (osteoarthritis) of knee 10/25/2017  . Well woman exam with routine gynecological exam 06/16/2017  . Vitamin D deficiency 03/25/2016    Phillips Climes, PTA 12/15/2017, 11:34 AM  [facility] [place], [place], [address] Phone: [phone]   Fax:  [phone]  Name: MAEBEL MARASCO MRN: [id number] Date of Birth: 04-18-1961

## 2017-12-17 ENCOUNTER — Encounter: Payer: Self-pay | Admitting: Physical Therapy

## 2017-12-17 ENCOUNTER — Ambulatory Visit: Payer: 59 | Admitting: Physical Therapy

## 2017-12-17 DIAGNOSIS — M25661 Stiffness of right knee, not elsewhere classified: Secondary | ICD-10-CM | POA: Diagnosis not present

## 2017-12-17 DIAGNOSIS — M25561 Pain in right knee: Secondary | ICD-10-CM

## 2017-12-17 DIAGNOSIS — R600 Localized edema: Secondary | ICD-10-CM

## 2017-12-17 DIAGNOSIS — M6281 Muscle weakness (generalized): Secondary | ICD-10-CM

## 2017-12-17 NOTE — Therapy (Signed)
[facility] [place], [place], [address] Phone: [phone]   Fax:  [phone]  Physical Therapy Treatment  Patient Details  Name: Sarah Hicks MRN: [id number] Date of Birth: 13-Oct-1960 Referring Provider: Dr. Gaynelle Arabian   Encounter Date: 12/17/2017  PT End of Session - 12/17/17 1203    Visit Number  22    Number of Visits  24    Date for PT Re-Evaluation  01/07/18    PT Start Time  1036    PT Stop Time  1137    PT Time Calculation (min)  61 min    Activity Tolerance  Patient tolerated treatment well    Behavior During Therapy  [facility] for tasks assessed/performed       Past Medical History:  Diagnosis Date  . Arthritis    oa  . Complication of anesthesia    told to breathe twice after a morton's neuroma surgery 2003, did fine with other surgeries since  . GERD (gastroesophageal reflux disease)   . Headache   . History of kidney stones    passed on own  . Hypertension   . Medical history non-contributory   . Scoliosis   . Vitamin D deficiency 03/25/2016    Past Surgical History:  Procedure Laterality Date  . ANKLE SURGERY Right 10 year ago  . cervical cryoablation  1998  . CHOLECYSTECTOMY  17 years ago  . COLONOSCOPY N/A 09/13/2013   Procedure: COLONOSCOPY;  Surgeon: Rogene Houston, MD;  [facility];  Service: Endoscopy;  Laterality: N/A;  730-moved to [address] Ann to notify pt  . FOOT SURGERY     Morton's neuroma  . KNEE SURGERY Left 10 years ago   torn ligemant and tendon repair  . TOTAL KNEE ARTHROPLASTY Right 10/25/2017   Procedure: RIGHT TOTAL KNEE ARTHROPLASTY;  Surgeon: Gaynelle Arabian, MD;  Location: [facility];  Service: Orthopedics;  Laterality: Right;  Adductor Block  . TUBAL LIGATION  19 years ago    There were no vitals filed for this visit.  Subjective Assessment - 12/17/17 1037    Subjective  Reports continued tightness in R knee.    Pertinent History  R ankle surgery, L knee  surgery    Patient Stated Goals  to get back to walking without pain    Currently in Pain?  Other (Comment) No pain assessment provided by patient          [facility] PT Assessment - 12/17/17 0001      Assessment   Medical Diagnosis  s/p R TKA    Onset Date/Surgical Date  10/25/17    Prior Therapy  no      Precautions   Precautions  Knee      Restrictions   Weight Bearing Restrictions  No      AROM   Overall AROM   Deficits    AROM Assessment Site  Knee    Right/Left Knee  Right    Right Knee Flexion  107                  [facility] Adult PT Treatment/Exercise - 12/17/17 0001      Knee/Hip Exercises: Aerobic   Stationary Bike  x17 min      Knee/Hip Exercises: Standing   Heel Raises  15 reps B toe raise x10 reps    Forward Lunges  Right;20 reps;5 seconds    Functional Squat  10 reps    Rocker Board  2 minutes  Knee/Hip Exercises: Supine   Other Supine Knee/Hip Exercises  Wall slides into flexion x10 reps 2# anklweight      Modalities   Modalities  Electrical Stimulation;Vasopneumatic      Acupuncturist Location  R knee    Electrical Stimulation Action  Pre-Mod    Electrical Stimulation Parameters  80-150 hz x15 min    Electrical Stimulation Goals  Pain;Edema      Vasopneumatic   Number Minutes Vasopneumatic   15 minutes    Vasopnuematic Location   Knee    Vasopneumatic Pressure  Medium    Vasopneumatic Temperature   34      Manual Therapy   Manual Therapy  Passive ROM;Taping    Manual therapy comments  Spider Taping x 2 strips for edema control    Passive ROM  PROM of R knee into flexion in supine to progress ROM               PT Short Term Goals - 11/29/17 1125      PT SHORT TERM GOAL #1   Title  I with HEP    Time  4    Period  Weeks    Status  Achieved 11/29/17      PT SHORT TERM GOAL #2   Title  Improved R knee ROM -5 to 100 degrees    Time  4    Period  Weeks    Status  Achieved 11/29/17       PT SHORT TERM GOAL #3   Title  Patient to report decreased pain by 50% with ambulation    Time  4    Period  Weeks    Status  Achieved 11/29/17      PT SHORT TERM GOAL #4   Title  Patient able to perform bed mobility independently    Time  4    Period  Weeks    Status  Achieved        PT Long Term Goals - 11/26/17 1236      PT LONG TERM GOAL #1   Title  I with advanced HEP    Period  Weeks    Status  On-going    Target Date  01/07/18      PT LONG TERM GOAL #2   Title  Patient able to ambulate community distances safely without AD.    Time  8    Period  Weeks    Status  On-going amb with walker    Target Date  01/07/18      PT LONG TERM GOAL #3   Title  Patient to demo improved R knee ROM 0-120 deg to normalize gait.    Time  8    Period  Weeks    Status  On-going    Target Date  01/07/18      PT LONG TERM GOAL #4   Title  Patient to demo 4+/5 RLE hip and knee strength to improve function.    Time  8    Period  Weeks    Status  On-going    Target Date  01/07/18      PT LONG TERM GOAL #5   Title  Patient to perform ADLs with 2/10 pain or less in R knee.    Time  8    Period  Weeks    Status  On-going    Target Date  01/07/18  Plan - 12/17/17 1215    Clinical Impression Statement  Patient tolerated today's treatment well as patient guided through ROM and light strengthening activities. Patient reported some discomfort along lateral R calf intermittantly but not during treatment. Primary focus was ROM into flexion which was progressed to 107 deg following PROM and therex. Normal modalities response noted following end of PT session and new taping completed as well for edema control of the R knee.    Rehab Potential  Excellent    PT Frequency  3x / week    PT Duration  4 weeks    PT Treatment/Interventions  ADLs/Self Care Home Management;Cryotherapy;Occupational psychologist;Therapeutic exercise;Manual  techniques;Patient/family education;Neuromuscular re-education;Passive range of motion;Taping;Vasopneumatic Device    PT Next Visit Plan  Cont with POC for ROM, strengthening and taping for edema. modalities PRN (MD. Maureen Ralphs 12/28/17)    PT Home Exercise Plan  Continue HEP for ROM, instructed pt in prone knee hangs     Consulted and Agree with Plan of Care  Patient    Family Member Consulted  Husband       Patient will benefit from skilled therapeutic intervention in order to improve the following deficits and impairments:  Abnormal gait, Increased edema, Pain, Decreased range of motion, Impaired flexibility, Decreased strength  Visit Diagnosis: Stiffness of right knee, not elsewhere classified  Localized edema  Muscle weakness (generalized)  Acute pain of right knee     Problem List Patient Active Problem List   Diagnosis Date Noted  . OA (osteoarthritis) of knee 10/25/2017  . Well woman exam with routine gynecological exam 06/16/2017  . Vitamin D deficiency 03/25/2016    Standley Brooking, PTA 12/17/2017, 12:19 PM  [facility] [address] [place], [place], [address] Phone: [phone]   Fax:  [phone]  Name: ARAH ARO MRN: [id number] Date of Birth: [birth year]

## 2017-12-20 ENCOUNTER — Encounter: Payer: Self-pay | Admitting: Physical Therapy

## 2017-12-20 ENCOUNTER — Ambulatory Visit: Payer: 59 | Admitting: Physical Therapy

## 2017-12-20 DIAGNOSIS — M6281 Muscle weakness (generalized): Secondary | ICD-10-CM

## 2017-12-20 DIAGNOSIS — M25661 Stiffness of right knee, not elsewhere classified: Secondary | ICD-10-CM

## 2017-12-20 DIAGNOSIS — R600 Localized edema: Secondary | ICD-10-CM

## 2017-12-20 NOTE — Therapy (Signed)
[facility] [place], [place], [address] Phone: [phone]   Fax:  [phone]  Physical Therapy Treatment  Patient Details  Name: Sarah Hicks MRN: [id number] Date of Birth: May 16, 1961 Referring Provider: Dr. Gaynelle Arabian   Encounter Date: 12/20/2017  PT End of Session - 12/20/17 1119    Visit Number  23    Number of Visits  24    Date for PT Re-Evaluation  01/07/18    PT Start Time  1031    PT Stop Time  1129    PT Time Calculation (min)  58 min    Activity Tolerance  Patient tolerated treatment well    Behavior During Therapy  [facility] for tasks assessed/performed       Past Medical History:  Diagnosis Date  . Arthritis    oa  . Complication of anesthesia    told to breathe twice after a morton's neuroma surgery 2003, did fine with other surgeries since  . GERD (gastroesophageal reflux disease)   . Headache   . History of kidney stones    passed on own  . Hypertension   . Medical history non-contributory   . Scoliosis   . Vitamin D deficiency 03/25/2016    Past Surgical History:  Procedure Laterality Date  . ANKLE SURGERY Right 10 year ago  . cervical cryoablation  1998  . CHOLECYSTECTOMY  17 years ago  . COLONOSCOPY N/A 09/13/2013   Procedure: COLONOSCOPY;  Surgeon: Rogene Houston, MD;  [facility];  Service: Endoscopy;  Laterality: N/A;  730-moved to [address] to notify pt  . FOOT SURGERY     Morton's neuroma  . KNEE SURGERY Left 10 years ago   torn ligemant and tendon repair  . TOTAL KNEE ARTHROPLASTY Right 10/25/2017   Procedure: RIGHT TOTAL KNEE ARTHROPLASTY;  Surgeon: Gaynelle Arabian, MD;  [facility];  Service: Orthopedics;  Laterality: Right;  Adductor Block  . TUBAL LIGATION  19 years ago    There were no vitals filed for this visit.  Subjective Assessment - 12/20/17 1032    Subjective  Reports continued tightness in R knee, yet feeling good today    Pertinent History  R  ankle surgery, L knee surgery    Patient Stated Goals  to get back to walking without pain    Currently in Pain?  Yes    Pain Score  2     Pain Location  Knee    Pain Orientation  Right    Pain Descriptors / Indicators  Tightness    Pain Type  Surgical pain    Pain Onset  More than a month ago    Pain Frequency  Intermittent    Aggravating Factors   ROM in knee    Pain Relieving Factors  rest and ice         [facility] PT Assessment - 12/20/17 0001      AROM   AROM Assessment Site  Knee    Right/Left Knee  Right    Right Knee Extension  -2    Right Knee Flexion  100      PROM   PROM Assessment Site  Knee    Right/Left Knee  Right    Right Knee Extension  0    Right Knee Flexion  110                  [facility] Adult PT Treatment/Exercise - 12/20/17 0001  Knee/Hip Exercises: Stretches   Knee: Self-Stretch to increase Flexion  Right;3 reps;30 seconds      Knee/Hip Exercises: Aerobic   Stationary Bike  x32min      Knee/Hip Exercises: Machines for Strengthening   Cybex Leg Press  1 1/2 plt (seat 6) x30      Knee/Hip Exercises: Standing   Rocker Board  2 minutes      Acupuncturist Location  R knee    Electrical Stimulation Action  premod    Electrical Stimulation Parameters  80-150hz  x50min    Electrical Stimulation Goals  Pain;Edema      Vasopneumatic   Number Minutes Vasopneumatic   15 minutes    Vasopnuematic Location   Knee    Vasopneumatic Pressure  Medium      Manual Therapy   Manual Therapy  Passive ROM    Soft tissue mobilization  scar massage and STW on scar and surrounding knee    Passive ROM  PROM of R knee into flexion in supine to progress ROM               PT Short Term Goals - 11/29/17 1125      PT SHORT TERM GOAL #1   Title  I with HEP    Time  4    Period  Weeks    Status  Achieved 11/29/17      PT SHORT TERM GOAL #2   Title  Improved R knee ROM -5 to 100 degrees    Time  4    Period   Weeks    Status  Achieved 11/29/17      PT SHORT TERM GOAL #3   Title  Patient to report decreased pain by 50% with ambulation    Time  4    Period  Weeks    Status  Achieved 11/29/17      PT SHORT TERM GOAL #4   Title  Patient able to perform bed mobility independently    Time  4    Period  Weeks    Status  Achieved        PT Long Term Goals - 11/26/17 1236      PT LONG TERM GOAL #1   Title  I with advanced HEP    Period  Weeks    Status  On-going    Target Date  01/07/18      PT LONG TERM GOAL #2   Title  Patient able to ambulate community distances safely without AD.    Time  8    Period  Weeks    Status  On-going amb with walker    Target Date  01/07/18      PT LONG TERM GOAL #3   Title  Patient to demo improved R knee ROM 0-120 deg to normalize gait.    Time  8    Period  Weeks    Status  On-going    Target Date  01/07/18      PT LONG TERM GOAL #4   Title  Patient to demo 4+/5 RLE hip and knee strength to improve function.    Time  8    Period  Weeks    Status  On-going    Target Date  01/07/18      PT LONG TERM GOAL #5   Title  Patient to perform ADLs with 2/10 pain or less in R knee.    Time  8  Period  Weeks    Status  On-going    Target Date  01/07/18            Plan - 12/20/17 1121    Clinical Impression Statement  Patient tolerated treament well today. Patient able to progress with all ROM and strength today. Patient has decrreased edema and no longer needs taping for edema. Patient continues to perorm HEP daily. ROM continues to improve and remain over 90 degrees. Goals ongoing.     Rehab Potential  Excellent    PT Frequency  3x / week    PT Duration  4 weeks    PT Treatment/Interventions  ADLs/Self Care Home Management;Cryotherapy;Occupational psychologist;Therapeutic exercise;Manual techniques;Patient/family education;Neuromuscular re-education;Passive range of motion;Taping;Vasopneumatic Device    PT Next  Visit Plan  Cont with POC for ROM, strengthening and taping for edema. modalities PRN (MD. Maureen Ralphs 12/28/17)    Consulted and Agree with Plan of Care  Patient       Patient will benefit from skilled therapeutic intervention in order to improve the following deficits and impairments:  Abnormal gait, Increased edema, Pain, Decreased range of motion, Impaired flexibility, Decreased strength  Visit Diagnosis: Stiffness of right knee, not elsewhere classified  Localized edema  Muscle weakness (generalized)     Problem List Patient Active Problem List   Diagnosis Date Noted  . OA (osteoarthritis) of knee 10/25/2017  . Well woman exam with routine gynecological exam 06/16/2017  . Vitamin D deficiency 03/25/2016    Phillips Climes, PTA 12/20/2017, 11:32 AM  [facility] [place], [place], [address] Phone: [phone]   Fax:  [phone]  Name: JAILEEN JANELLE MRN: [id number] Date of Birth: 1961/04/20

## 2017-12-22 ENCOUNTER — Encounter: Payer: Self-pay | Admitting: Physical Therapy

## 2017-12-22 ENCOUNTER — Ambulatory Visit: Payer: 59 | Admitting: Physical Therapy

## 2017-12-22 DIAGNOSIS — M25561 Pain in right knee: Secondary | ICD-10-CM

## 2017-12-22 DIAGNOSIS — M25661 Stiffness of right knee, not elsewhere classified: Secondary | ICD-10-CM | POA: Diagnosis not present

## 2017-12-22 DIAGNOSIS — R600 Localized edema: Secondary | ICD-10-CM

## 2017-12-22 DIAGNOSIS — M6281 Muscle weakness (generalized): Secondary | ICD-10-CM

## 2017-12-22 NOTE — Therapy (Signed)
[facility] [place], [place], [address] Phone: [phone]   Fax:  [phone]  Physical Therapy Treatment  Patient Details  Name: Sarah Hicks MRN: [id number] Date of Birth: 05-13-1961 Referring Provider: Dr. Gaynelle Arabian   Encounter Date: 12/22/2017  PT End of Session - 12/22/17 1119    Visit Number  24    Number of Visits  24    Date for PT Re-Evaluation  01/07/18    PT Start Time  1031    PT Stop Time  1134    PT Time Calculation (min)  63 min    Activity Tolerance  Patient tolerated treatment well    Behavior During Therapy  [facility] for tasks assessed/performed       Past Medical History:  Diagnosis Date  . Arthritis    oa  . Complication of anesthesia    told to breathe twice after a morton's neuroma surgery 2003, did fine with other surgeries since  . GERD (gastroesophageal reflux disease)   . Headache   . History of kidney stones    passed on own  . Hypertension   . Medical history non-contributory   . Scoliosis   . Vitamin D deficiency 03/25/2016    Past Surgical History:  Procedure Laterality Date  . ANKLE SURGERY Right 10 year ago  . cervical cryoablation  1998  . CHOLECYSTECTOMY  17 years ago  . COLONOSCOPY N/A 09/13/2013   Procedure: COLONOSCOPY;  Surgeon: Rogene Houston, MD;  [facility];  Service: Endoscopy;  Laterality: N/A;  730-moved to [address] Ann to notify pt  . FOOT SURGERY     Morton's neuroma  . KNEE SURGERY Left 10 years ago   torn ligemant and tendon repair  . TOTAL KNEE ARTHROPLASTY Right 10/25/2017   Procedure: RIGHT TOTAL KNEE ARTHROPLASTY;  Surgeon: Gaynelle Arabian, MD;  [facility];  Service: Orthopedics;  Laterality: Right;  Adductor Block  . TUBAL LIGATION  19 years ago    There were no vitals filed for this visit.  Subjective Assessment - 12/22/17 1039    Subjective  Reports continued tightness in R knee, yet feeling good today    Pertinent History  R  ankle surgery, L knee surgery    Patient Stated Goals  to get back to walking without pain    Currently in Pain?  Yes    Pain Score  2     Pain Location  Knee    Pain Orientation  Right    Pain Descriptors / Indicators  Tightness    Pain Type  Surgical pain    Pain Onset  More than a month ago    Pain Frequency  Intermittent    Aggravating Factors   ROM    Pain Relieving Factors  rest         [facility] PT Assessment - 12/22/17 0001      AROM   AROM Assessment Site  Knee    Right/Left Knee  Right    Right Knee Extension  0    Right Knee Flexion  101      PROM   PROM Assessment Site  Knee    Right/Left Knee  Right    Right Knee Extension  0    Right Knee Flexion  111                  [facility] Adult PT Treatment/Exercise - 12/22/17 0001      Knee/Hip Exercises: Aerobic  Stationary Bike  x19mn      Knee/Hip Exercises: Machines for Strengthening   Cybex Knee Extension  10# holds for stretch and strength x20    Cybex Leg Press  1 1/2 plt (seat 6) x30      Knee/Hip Exercises: Standing   Rocker Board  2 minutes    Other Standing Knee Exercises  up/down stairs x4      Electrical Stimulation   Electrical Stimulation Location  R knee    Electrical Stimulation Action  IFC    Electrical Stimulation Parameters  80-_0  x138m    Electrical Stimulation Goals  Pain;Edema      Vasopneumatic   Number Minutes Vasopneumatic   15 minutes    Vasopnuematic Location   Knee    Vasopneumatic Pressure  Medium      Manual Therapy   Manual Therapy  Passive ROM    Soft tissue mobilization  IASTM and manual scar massage and STW on scar and surrounding knee    Passive ROM  PROM of R knee into flexion in supine to progress ROM               PT Short Term Goals - 11/29/17 1125      PT SHORT TERM GOAL #1   Title  I with HEP    Time  4    Period  Weeks    Status  Achieved 11/29/17      PT SHORT TERM GOAL #2   Title  Improved R knee ROM -5 to 100 degrees    Time  4     Period  Weeks    Status  Achieved 11/29/17      PT SHORT TERM GOAL #3   Title  Patient to report decreased pain by 50% with ambulation    Time  4    Period  Weeks    Status  Achieved 11/29/17      PT SHORT TERM GOAL #4   Title  Patient able to perform bed mobility independently    Time  4    Period  Weeks    Status  Achieved        PT Long Term Goals - 12/22/17 1127      PT LONG TERM GOAL #1   Title  I with advanced HEP    Time  8    Period  Weeks    Status  On-going      PT LONG TERM GOAL #2   Title  Patient able to ambulate community distances safely without AD.    Time  8    Period  Weeks    Status  On-going using SPC currently 12/22/17      PT LONG TERM GOAL #3   Title  Patient to demo improved R knee ROM 0-120 deg to normalize gait.    Time  8    Period  Weeks    Status  Partially Met AROM 0-101 12/22/17      PT LONG TERM GOAL #4   Title  Patient to demo 4+/5 RLE hip and knee strength to improve function.    Time  8    Period  Weeks    Status  On-going      PT LONG TERM GOAL #5   Title  Patient to perform ADLs with 2/10 pain or less in R knee.    Time  8    Period  Weeks    Status  On-going  Plan - 12/22/17 1131    Clinical Impression Statement  Patient tolerated treatment well today. Patient making improvement with ROM in right knee with both flexion and ext. Patient has full ROM for flexion today. Patient has ongoing tightness for flexion yet able to make small progress. patient able o perform stairs safetly today yet has to use assist and with reciprocating stair gait. goals progressing yet ongoing. F/U appt next week.     Rehab Potential  Excellent    PT Frequency  3x / week    PT Duration  4 weeks    PT Treatment/Interventions  ADLs/Self Care Home Management;Cryotherapy;Occupational psychologist;Therapeutic exercise;Manual techniques;Patient/family education;Neuromuscular re-education;Passive range of  motion;Taping;Vasopneumatic Device    PT Next Visit Plan  Cont with POC for ROM, strengthening and modalities PRN (MD. Maureen Ralphs 12/28/17)    Consulted and Agree with Plan of Care  Patient       Patient will benefit from skilled therapeutic intervention in order to improve the following deficits and impairments:  Abnormal gait, Increased edema, Pain, Decreased range of motion, Impaired flexibility, Decreased strength  Visit Diagnosis: Stiffness of right knee, not elsewhere classified  Localized edema  Muscle weakness (generalized)  Acute pain of right knee     Problem List Patient Active Problem List   Diagnosis Date Noted  . OA (osteoarthritis) of knee 10/25/2017  . Well woman exam with routine gynecological exam 06/16/2017  . Vitamin D deficiency 03/25/2016    Phillips Climes, PTA 12/22/2017, 11:45 AM  [facility] [place], [place], [address] Phone: [phone]   Fax:  [phone]  Name: Sarah Hicks MRN: [id number] Date of Birth: 1960/12/29

## 2017-12-24 ENCOUNTER — Encounter: Payer: Self-pay | Admitting: Physical Therapy

## 2017-12-24 ENCOUNTER — Ambulatory Visit: Payer: 59 | Admitting: Physical Therapy

## 2017-12-24 DIAGNOSIS — M25661 Stiffness of right knee, not elsewhere classified: Secondary | ICD-10-CM

## 2017-12-24 DIAGNOSIS — M6281 Muscle weakness (generalized): Secondary | ICD-10-CM

## 2017-12-24 DIAGNOSIS — M25561 Pain in right knee: Secondary | ICD-10-CM

## 2017-12-24 DIAGNOSIS — R600 Localized edema: Secondary | ICD-10-CM

## 2017-12-24 NOTE — Therapy (Signed)
[facility] [place], [place], [address] Phone: [phone]   Fax:  [phone]  Physical Therapy Treatment  Patient Details  Name: Sarah Hicks MRN: [id number] Date of Birth: 01-29-1961 Referring Provider: Dr. Gaynelle Arabian   Encounter Date: 12/24/2017  PT End of Session - 12/24/17 1207    Visit Number  25    Number of Visits  24    Date for PT Re-Evaluation  01/07/18    PT Start Time  1036    PT Stop Time  1141    PT Time Calculation (min)  65 min    Activity Tolerance  Patient tolerated treatment well    Behavior During Therapy  [facility] for tasks assessed/performed       Past Medical History:  Diagnosis Date  . Arthritis    oa  . Complication of anesthesia    told to breathe twice after a morton's neuroma surgery 2003, did fine with other surgeries since  . GERD (gastroesophageal reflux disease)   . Headache   . History of kidney stones    passed on own  . Hypertension   . Medical history non-contributory   . Scoliosis   . Vitamin D deficiency 03/25/2016    Past Surgical History:  Procedure Laterality Date  . ANKLE SURGERY Right 10 year ago  . cervical cryoablation  1998  . CHOLECYSTECTOMY  17 years ago  . COLONOSCOPY N/A 09/13/2013   Procedure: COLONOSCOPY;  Surgeon: Rogene Houston, MD;  [facility];  Service: Endoscopy;  Laterality: N/A;  730-moved to 73 Ann to notify pt  . FOOT SURGERY     Morton's neuroma  . KNEE SURGERY Left 10 years ago   torn ligemant and tendon repair  . TOTAL KNEE ARTHROPLASTY Right 10/25/2017   Procedure: RIGHT TOTAL KNEE ARTHROPLASTY;  Surgeon: Gaynelle Arabian, MD;  Location: [facility];  Service: Orthopedics;  Laterality: Right;  Adductor Block  . TUBAL LIGATION  19 years ago    There were no vitals filed for this visit.  Subjective Assessment - 12/24/17 1052    Subjective  Reports R knee tightness.    Pertinent History  R ankle surgery, L knee surgery    Patient Stated Goals  to get back to walking without pain    Currently in Pain?  No/denies         [facility] PT Assessment - 12/24/17 0001      Assessment   Medical Diagnosis  s/p R TKA    Onset Date/Surgical Date  10/25/17    Next MD Visit  12/28/2017    Prior Therapy  no      Precautions   Precautions  Knee      Restrictions   Weight Bearing Restrictions  No      Observation/Other Assessments-Edema    Edema  Circumferential      Circumferential Edema   Circumferential - Right  49.6 CM    Circumferential - Left   48.1 CM      ROM / Strength   AROM / PROM / Strength  AROM      AROM   Overall AROM   Deficits    AROM Assessment Site  Knee    Right/Left Knee  Right    Right Knee Extension  0    Right Knee Flexion  106                  [facility] Adult PT Treatment/Exercise - 12/24/17 0001  Knee/Hip Exercises: Aerobic   Stationary Bike  x7mn      Knee/Hip Exercises: Machines for Strengthening   Cybex Knee Extension  10# X20 REPS    Cybex Knee Flexion  30# 2x10 reps    Cybex Leg Press  1 pl, seat 7 x20 reps      Knee/Hip Exercises: Standing   Heel Raises  Both;10 reps    Forward Lunges  Right;20 reps;5 seconds    Functional Squat  15 reps    Rocker Board  3 minutes      Modalities   Modalities  Cryotherapy;Electrical Stimulation      Cryotherapy   Number Minutes Cryotherapy  15 Minutes    Cryotherapy Location  Knee    Type of Cryotherapy  Ice pack      Electrical Stimulation   Electrical Stimulation Location  R knee    Electrical Stimulation Action  IFC    Electrical Stimulation Parameters  1-10 hz x15 min    Electrical Stimulation Goals  Pain;Edema      Manual Therapy   Manual Therapy  Passive ROM    Passive ROM  PROM of R knee into flexion in supine to progress ROM               PT Short Term Goals - 11/29/17 1125      PT SHORT TERM GOAL #1   Title  I with HEP    Time  4    Period  Weeks    Status  Achieved 11/29/17      PT  SHORT TERM GOAL #2   Title  Improved R knee ROM -5 to 100 degrees    Time  4    Period  Weeks    Status  Achieved 11/29/17      PT SHORT TERM GOAL #3   Title  Patient to report decreased pain by 50% with ambulation    Time  4    Period  Weeks    Status  Achieved 11/29/17      PT SHORT TERM GOAL #4   Title  Patient able to perform bed mobility independently    Time  4    Period  Weeks    Status  Achieved        PT Long Term Goals - 12/22/17 1127      PT LONG TERM GOAL #1   Title  I with advanced HEP    Time  8    Period  Weeks    Status  On-going      PT LONG TERM GOAL #2   Title  Patient able to ambulate community distances safely without AD.    Time  8    Period  Weeks    Status  On-going using SPC currently 12/22/17      PT LONG TERM GOAL #3   Title  Patient to demo improved R knee ROM 0-120 deg to normalize gait.    Time  8    Period  Weeks    Status  Partially Met AROM 0-101 12/22/17      PT LONG TERM GOAL #4   Title  Patient to demo 4+/5 RLE hip and knee strength to improve function.    Time  8    Period  Weeks    Status  On-going      PT LONG TERM GOAL #5   Title  Patient to perform ADLs with 2/10 pain or less in R knee.  Time  8    Period  Weeks    Status  On-going            Plan - 12/24/17 1208    Clinical Impression Statement  Patient continues to tolerate treatment well athough R knee tightness still reported by patient. Patient able to tolerate machine strengthening well with only reports of weakness with light weights. Patient has noted more swelling after not being taped in the past few appointments. Minimal increase in edema measured today as recorded in today's note. Patient continues to slowly improve in R knee flexion as well. Normal modalities response noted following removal of the modalities.    Rehab Potential  Excellent    PT Frequency  3x / week    PT Duration  4 weeks    PT Treatment/Interventions  ADLs/Self Care Home  Management;Cryotherapy;Occupational psychologist;Therapeutic exercise;Manual techniques;Patient/family education;Neuromuscular re-education;Passive range of motion;Taping;Vasopneumatic Device    PT Next Visit Plan  Cont with POC for ROM, strengthening and modalities PRN (MD. Maureen Ralphs 12/28/17)    PT Home Exercise Plan  Continue HEP for ROM, instructed pt in prone knee hangs     Consulted and Agree with Plan of Care  Patient       Patient will benefit from skilled therapeutic intervention in order to improve the following deficits and impairments:  Abnormal gait, Increased edema, Pain, Decreased range of motion, Impaired flexibility, Decreased strength  Visit Diagnosis: Stiffness of right knee, not elsewhere classified  Localized edema  Muscle weakness (generalized)  Acute pain of right knee     Problem List Patient Active Problem List   Diagnosis Date Noted  . OA (osteoarthritis) of knee 10/25/2017  . Well woman exam with routine gynecological exam 06/16/2017  . Vitamin D deficiency 03/25/2016    Standley Brooking, PTA 12/24/2017, 12:15 PM  [facility] [address] [place], [place], [address] Phone: [phone]   Fax:  [phone]  Name: Sarah Hicks MRN: [id number] Date of Birth: 1961-08-16

## 2017-12-27 ENCOUNTER — Ambulatory Visit: Payer: 59 | Admitting: Physical Therapy

## 2017-12-27 ENCOUNTER — Encounter: Payer: Self-pay | Admitting: Physical Therapy

## 2017-12-27 DIAGNOSIS — M25661 Stiffness of right knee, not elsewhere classified: Secondary | ICD-10-CM | POA: Diagnosis not present

## 2017-12-27 DIAGNOSIS — R600 Localized edema: Secondary | ICD-10-CM

## 2017-12-27 DIAGNOSIS — M6281 Muscle weakness (generalized): Secondary | ICD-10-CM

## 2017-12-27 DIAGNOSIS — M25561 Pain in right knee: Secondary | ICD-10-CM

## 2017-12-27 NOTE — Therapy (Signed)
[facility] [place], [place], [address] Phone: [phone]   Fax:  [phone]  Physical Therapy Treatment  Patient Details  Name: Sarah Hicks MRN: [id number] Date of Birth: [birth year] Referring Provider: Dr. Gaynelle Arabian   Encounter Date: 12/27/2017  PT End of Session - 12/27/17 0907    Visit Number  26    Number of Visits  24    Date for PT Re-Evaluation  01/07/18    PT Start Time  0903    PT Stop Time  1010    PT Time Calculation (min)  67 min    Activity Tolerance  Patient tolerated treatment well    Behavior During Therapy  [facility] for tasks assessed/performed       Past Medical History:  Diagnosis Date  . Arthritis    oa  . Complication of anesthesia    told to breathe twice after a morton's neuroma surgery 2003, did fine with other surgeries since  . GERD (gastroesophageal reflux disease)   . Headache   . History of kidney stones    passed on own  . Hypertension   . Medical history non-contributory   . Scoliosis   . Vitamin D deficiency 03/25/2016    Past Surgical History:  Procedure Laterality Date  . ANKLE SURGERY Right 10 year ago  . cervical cryoablation  1998  . CHOLECYSTECTOMY  17 years ago  . COLONOSCOPY N/A 09/13/2013   Procedure: COLONOSCOPY;  Surgeon: Rogene Houston, MD;  [facility];  Service: Endoscopy;  Laterality: N/A;  730-moved to 36 Ann to notify pt  . FOOT SURGERY     Morton's neuroma  . KNEE SURGERY Left 10 years ago   torn ligemant and tendon repair  . TOTAL KNEE ARTHROPLASTY Right 10/25/2017   Procedure: RIGHT TOTAL KNEE ARTHROPLASTY;  Surgeon: Gaynelle Arabian, MD;  [facility];  Service: Orthopedics;  Laterality: Right;  Adductor Block  . TUBAL LIGATION  19 years ago    There were no vitals filed for this visit.  Subjective Assessment - 12/27/17 0903    Subjective  Reports MD visit tomorrow.     Patient is accompained by:  Family member Friend     Pertinent History  R ankle surgery, L knee surgery    Patient Stated Goals  to get back to walking without pain    Currently in Pain?  No/denies         [facility] PT Assessment - 12/27/17 0001      Assessment   Medical Diagnosis  s/p R TKA    Onset Date/Surgical Date  10/25/17    Next MD Visit  12/28/2017    Prior Therapy  no      Precautions   Precautions  Knee      Restrictions   Weight Bearing Restrictions  No      Observation/Other Assessments-Edema    Edema  Circumferential      Circumferential Edema   Circumferential - Right  49.9 cm    Circumferential - Left   48.7 cm      ROM / Strength   AROM / PROM / Strength  AROM;Strength      AROM   Overall AROM   Deficits    AROM Assessment Site  Knee    Right/Left Knee  Right    Right Knee Extension  0    Right Knee Flexion  105      Strength   Overall Strength  Deficits    Strength Assessment Site  Knee    Right/Left Knee  Right    Right Knee Flexion  4/5    Right Knee Extension  4+/5                  [facility] Adult PT Treatment/Exercise - 12/27/17 0001      Knee/Hip Exercises: Aerobic   Stationary Bike  x45mn      Knee/Hip Exercises: Machines for Strengthening   Cybex Knee Extension  10# X20 REPS    Cybex Knee Flexion  40# 2x10 reps    Cybex Leg Press  1 pl, seat 7 x20 reps      Knee/Hip Exercises: Standing   Forward Lunges  Right;20 reps;5 seconds    Rocker Board  3 minutes      Knee/Hip Exercises: Supine   Other Supine Knee/Hip Exercises  Wall slides into flexion x10 reps  106 deg measured      Modalities   Modalities  Electrical Stimulation;Vasopneumatic      Electrical Stimulation   Electrical Stimulation Location  R knee    Electrical Stimulation Action  IFC    Electrical Stimulation Parameters  1-10 hz x15 min    Electrical Stimulation Goals  Edema      Vasopneumatic   Number Minutes Vasopneumatic   15 minutes    Vasopnuematic Location   Knee    Vasopneumatic Pressure  Medium     Vasopneumatic Temperature   34      Manual Therapy   Manual Therapy  Taping    Manual therapy comments  Spidering taping medially and laterally to R knee to reduce inflammation               PT Short Term Goals - 11/29/17 1125      PT SHORT TERM GOAL #1   Title  I with HEP    Time  4    Period  Weeks    Status  Achieved 11/29/17      PT SHORT TERM GOAL #2   Title  Improved R knee ROM -5 to 100 degrees    Time  4    Period  Weeks    Status  Achieved 11/29/17      PT SHORT TERM GOAL #3   Title  Patient to report decreased pain by 50% with ambulation    Time  4    Period  Weeks    Status  Achieved 11/29/17      PT SHORT TERM GOAL #4   Title  Patient able to perform bed mobility independently    Time  4    Period  Weeks    Status  Achieved        PT Long Term Goals - 12/27/17 1032      PT LONG TERM GOAL #1   Title  I with advanced HEP    Time  8    Period  Weeks    Status  Achieved      PT LONG TERM GOAL #2   Title  Patient able to ambulate community distances safely without AD.    Time  8    Period  Weeks    Status  On-going using SPC currently 12/22/17      PT LONG TERM GOAL #3   Title  Patient to demo improved R knee ROM 0-120 deg to normalize gait.    Time  8    Period  Weeks  Status  Partially Met AROM 0-105 deg 12/27/17      PT LONG TERM GOAL #4   Title  Patient to demo 4+/5 RLE hip and knee strength to improve function.    Time  8    Period  Weeks    Status  Partially Met R knee extensors 4+/5, R knee flexors 4/5 as of 12/27/2017      PT LONG TERM GOAL #5   Title  Patient to perform ADLs with 2/10 pain or less in R knee.    Time  8    Period  Weeks    Status  On-going            Plan - 12/27/17 0958    Clinical Impression Statement  Patient continues to tolerate treatment well as pain has reduced although patient noticing more inflammation in R knee since taping not completed. Patient able to complete ROM and strengthening exercises  well with no complaints of increased pain. Patient ambulated into clinic with [place]. Patient's R knee observed with increased fluid just superior and inferior the R patella although edema measured within 1.2 cm difference. AROM of R knee measured as 0-105 deg and MMT of R knee flexors 4/5, R knee extensors 4+/5. Normal modalities response noted folllowing removal of modalities.    Rehab Potential  Excellent    PT Frequency  3x / week    PT Duration  4 weeks    PT Treatment/Interventions  ADLs/Self Care Home Management;Cryotherapy;Occupational psychologist;Therapeutic exercise;Manual techniques;Patient/family education;Neuromuscular re-education;Passive range of motion;Taping;Vasopneumatic Device    PT Next Visit Plan  Cont with POC for ROM, strengthening and modalities PRN (MD. Maureen Ralphs 12/28/17)    PT Home Exercise Plan  Continue HEP for ROM, instructed pt in prone knee hangs     Consulted and Agree with Plan of Care  Patient;Family member/caregiver    Family Member Consulted  Friend       Patient will benefit from skilled therapeutic intervention in order to improve the following deficits and impairments:  Abnormal gait, Increased edema, Pain, Decreased range of motion, Impaired flexibility, Decreased strength  Visit Diagnosis: Stiffness of right knee, not elsewhere classified  Localized edema  Muscle weakness (generalized)  Acute pain of right knee     Problem List Patient Active Problem List   Diagnosis Date Noted  . OA (osteoarthritis) of knee 10/25/2017  . Well woman exam with routine gynecological exam 06/16/2017  . Vitamin D deficiency 03/25/2016   Standley Brooking, PTA 12/27/17 10:35 AM   [facility] [place], [place], [address] Phone: [phone]   Fax:  [phone]  Name: AMERIKA NOURSE MRN: [id number] Date of Birth: 1961/09/08

## 2017-12-29 ENCOUNTER — Encounter: Payer: Self-pay | Admitting: Physical Therapy

## 2017-12-29 ENCOUNTER — Ambulatory Visit: Payer: 59 | Admitting: Physical Therapy

## 2017-12-29 DIAGNOSIS — M6281 Muscle weakness (generalized): Secondary | ICD-10-CM

## 2017-12-29 DIAGNOSIS — R600 Localized edema: Secondary | ICD-10-CM

## 2017-12-29 DIAGNOSIS — M25661 Stiffness of right knee, not elsewhere classified: Secondary | ICD-10-CM

## 2017-12-29 DIAGNOSIS — M25561 Pain in right knee: Secondary | ICD-10-CM

## 2017-12-29 NOTE — Therapy (Signed)
[facility] [place], [place], [address] Phone: [phone]   Fax:  [phone]  Physical Therapy Treatment  Patient Details  Name: Sarah Hicks MRN: [id number] Date of Birth: [birth year] Referring Provider: Dr. Gaynelle Arabian   Encounter Date: 12/29/2017  PT End of Session - 12/29/17 0941    Visit Number  27    Date for PT Re-Evaluation  01/07/18    PT Start Time  0904    PT Stop Time  1000    PT Time Calculation (min)  56 min    Activity Tolerance  Patient tolerated treatment well    Behavior During Therapy  [facility] for tasks assessed/performed       Past Medical History:  Diagnosis Date  . Arthritis    oa  . Complication of anesthesia    told to breathe twice after a morton's neuroma surgery 2003, did fine with other surgeries since  . GERD (gastroesophageal reflux disease)   . Headache   . History of kidney stones    passed on own  . Hypertension   . Medical history non-contributory   . Scoliosis   . Vitamin D deficiency 03/25/2016    Past Surgical History:  Procedure Laterality Date  . ANKLE SURGERY Right 10 year ago  . cervical cryoablation  1998  . CHOLECYSTECTOMY  17 years ago  . COLONOSCOPY N/A 09/13/2013   Procedure: COLONOSCOPY;  Surgeon: Rogene Houston, MD;  [facility];  Service: Endoscopy;  Laterality: N/A;  730-moved to [address] to notify pt  . FOOT SURGERY     Morton's neuroma  . KNEE SURGERY Left 10 years ago   torn ligemant and tendon repair  . TOTAL KNEE ARTHROPLASTY Right 10/25/2017   Procedure: RIGHT TOTAL KNEE ARTHROPLASTY;  Surgeon: Gaynelle Arabian, MD;  Location[facility];  Service: Orthopedics;  Laterality: Right;  Adductor Block  . TUBAL LIGATION  19 years ago    There were no vitals filed for this visit.  Subjective Assessment - 12/29/17 0907    Subjective  Patient went to MD and is doing good and to cont therapy and have a F/U in a month    Pertinent History  R ankle  surgery, L knee surgery    Patient Stated Goals  to get back to walking without pain    Currently in Pain?  Yes    Pain Score  2     Pain Location  Knee    Pain Orientation  Right    Pain Descriptors / Indicators  Tightness    Pain Type  Surgical pain    Pain Onset  More than a month ago    Pain Frequency  Intermittent    Aggravating Factors   ROM for flexion    Pain Relieving Factors  rest         [facility] PT Assessment - 12/29/17 0001      AROM   AROM Assessment Site  Knee    Right/Left Knee  Right    Right Knee Extension  0    Right Knee Flexion  97      PROM   PROM Assessment Site  Knee    Right/Left Knee  Right    Right Knee Extension  0    Right Knee Flexion  110                  [facility] Adult PT Treatment/Exercise - 12/29/17 0001  Knee/Hip Exercises: Aerobic   Stationary Bike  x52mn      Knee/Hip Exercises: Machines for Strengthening   Cybex Knee Extension  10# x20    Cybex Knee Flexion  40# x20      Knee/Hip Exercises: Standing   Forward Step Up  Right;2 sets;20 reps;Step Height: 6";Hand Hold: 1    Rocker Board  2 minutes      EAcupuncturistLocation  R knee    Electrical Stimulation Action  IFC    Electrical Stimulation Parameters  1-10 hz x173m    Electrical Stimulation Goals  Edema      Vasopneumatic   Number Minutes Vasopneumatic   15 minutes    Vasopnuematic Location   Knee    Vasopneumatic Pressure  Medium      Manual Therapy   Manual Therapy  Passive ROM    Passive ROM  PROM of R knee into flexion in supine to progress ROM               PT Short Term Goals - 11/29/17 1125      PT SHORT TERM GOAL #1   Title  I with HEP    Time  4    Period  Weeks    Status  Achieved 11/29/17      PT SHORT TERM GOAL #2   Title  Improved R knee ROM -5 to 100 degrees    Time  4    Period  Weeks    Status  Achieved 11/29/17      PT SHORT TERM GOAL #3   Title  Patient to report decreased pain by 50%  with ambulation    Time  4    Period  Weeks    Status  Achieved 11/29/17      PT SHORT TERM GOAL #4   Title  Patient able to perform bed mobility independently    Time  4    Period  Weeks    Status  Achieved        PT Long Term Goals - 12/27/17 1032      PT LONG TERM GOAL #1   Title  I with advanced HEP    Time  8    Period  Weeks    Status  Achieved      PT LONG TERM GOAL #2   Title  Patient able to ambulate community distances safely without AD.    Time  8    Period  Weeks    Status  On-going using SPC currently 12/22/17      PT LONG TERM GOAL #3   Title  Patient to demo improved R knee ROM 0-120 deg to normalize gait.    Time  8    Period  Weeks    Status  Partially Met AROM 0-105 deg 12/27/17      PT LONG TERM GOAL #4   Title  Patient to demo 4+/5 RLE hip and knee strength to improve function.    Time  8    Period  Weeks    Status  Partially Met R knee extensors 4+/5, R knee flexors 4/5 as of 12/27/2017      PT LONG TERM GOAL #5   Title  Patient to perform ADLs with 2/10 pain or less in R knee.    Time  8    Period  Weeks    Status  On-going  Plan - 12/29/17 0947    Clinical Impression Statement  Patient tolerated treatment well today. Patient went to MD and is to continue therapy. Will need to send re-cert due to no new order sent. Ptient progressing with right knee strengthening and flexion ROM. Ongoing tightness with flexion motion today. Less pain and soreness reported overall. Goals progressing.     Rehab Potential  Excellent    PT Frequency  3x / week    PT Duration  4 weeks    PT Treatment/Interventions  ADLs/Self Care Home Management;Cryotherapy;Occupational psychologist;Therapeutic exercise;Manual techniques;Patient/family education;Neuromuscular re-education;Passive range of motion;Taping;Vasopneumatic Device    PT Next Visit Plan  Cont with POC for ROM, strengthening and modalities PRN (MD. Maureen Ralphs re cert  sent/awaiting)    Consulted and Agree with Plan of Care  Patient       Patient will benefit from skilled therapeutic intervention in order to improve the following deficits and impairments:  Abnormal gait, Increased edema, Pain, Decreased range of motion, Impaired flexibility, Decreased strength  Visit Diagnosis: Stiffness of right knee, not elsewhere classified  Localized edema  Muscle weakness (generalized)  Acute pain of right knee     Problem List Patient Active Problem List   Diagnosis Date Noted  . OA (osteoarthritis) of knee 10/25/2017  . Well woman exam with routine gynecological exam 06/16/2017  . Vitamin D deficiency 03/25/2016    Ladean Raya, PTA 12/29/17 10:00 AM  [facility] [place], [place], [address] Phone: [phone]   Fax:  [phone]  Name: Sarah Hicks MRN: [id number] Date of Birth: Sep 27, 1961

## 2017-12-31 ENCOUNTER — Ambulatory Visit: Payer: 59 | Admitting: Physical Therapy

## 2017-12-31 DIAGNOSIS — M6281 Muscle weakness (generalized): Secondary | ICD-10-CM

## 2017-12-31 DIAGNOSIS — M25661 Stiffness of right knee, not elsewhere classified: Secondary | ICD-10-CM

## 2017-12-31 DIAGNOSIS — R600 Localized edema: Secondary | ICD-10-CM

## 2017-12-31 DIAGNOSIS — M25561 Pain in right knee: Secondary | ICD-10-CM

## 2017-12-31 NOTE — Therapy (Signed)
[facility] [place], [place], [address] Phone: [phone]   Fax:  [phone]  Physical Therapy Treatment  Patient Details  Name: Sarah Hicks MRN: [id number] Date of Birth: [birth year] Referring Provider: Dr. Gaynelle Arabian   Encounter Date: 12/31/2017   PT End of Session - 12/31/17 1034    Visit Number  28    Number of Visits  32    Date for PT Re-Evaluation  02/04/18    PT Start Time  1034    PT Stop Time  1135    PT Time Calculation (min)  61 min    Activity Tolerance  Patient tolerated treatment well    Behavior During Therapy  [facility] for tasks assessed/performed       Past Medical History:  Diagnosis Date  . Arthritis    oa  . Complication of anesthesia    told to breathe twice after a morton's neuroma surgery 2003, did fine with other surgeries since  . GERD (gastroesophageal reflux disease)   . Headache   . History of kidney stones    passed on own  . Hypertension   . Medical history non-contributory   . Scoliosis   . Vitamin D deficiency 03/25/2016    Past Surgical History:  Procedure Laterality Date  . ANKLE SURGERY Right 10 year ago  . cervical cryoablation  1998  . CHOLECYSTECTOMY  17 years ago  . COLONOSCOPY N/A 09/13/2013   Procedure: COLONOSCOPY;  Surgeon: Rogene Houston, MD;  [facility];  Service: Endoscopy;  Laterality: N/A;  730-moved to 50 Ann to notify pt  . FOOT SURGERY     Morton's neuroma  . KNEE SURGERY Left 10 years ago   torn ligemant and tendon repair  . TOTAL KNEE ARTHROPLASTY Right 10/25/2017   Procedure: RIGHT TOTAL KNEE ARTHROPLASTY;  Surgeon: Gaynelle Arabian, MD;  Location: [facility];  Service: Orthopedics;  Laterality: Right;  Adductor Block  . TUBAL LIGATION  19 years ago    There were no vitals filed for this visit.  Subjective Assessment - 12/31/17 1035    Subjective  Patient reported feeling stiff and tight. Pain is rated a 1/10. Patient stated she is now  driving.    Pertinent History  R ankle surgery, L knee surgery    Patient Stated Goals  to get back to walking without pain    Currently in Pain?  Yes    Pain Score  1     Pain Orientation  Right    Pain Descriptors / Indicators  Tightness    Pain Onset  More than a month ago                No data recorded       [facility] Adult PT Treatment/Exercise - 12/31/17 0001      Knee/Hip Exercises: Aerobic   Stationary Bike  level 4 x15 minutes      Knee/Hip Exercises: Standing   Heel Raises  Both;10 reps    Knee Flexion  Right;15 reps    Hip Abduction  AROM;Stengthening;Both;2 sets;10 reps;Knee straight    Step Down  Right;2 sets;10 reps;Step Height: 4";Hand Hold: 2    Rocker Board  3 minutes      Modalities   Modalities  Vasopneumatic      Vasopneumatic   Number Minutes Vasopneumatic   15 minutes    Vasopnuematic Location   Knee    Vasopneumatic Pressure  Medium  Manual Therapy   Manual Therapy  Taping    Manual therapy comments  Spidering taping medially and laterally to R knee to reduce inflammation               PT Short Term Goals - 11/29/17 1125      PT SHORT TERM GOAL #1   Title  I with HEP    Time  4    Period  Weeks    Status  Achieved 11/29/17      PT SHORT TERM GOAL #2   Title  Improved R knee ROM -5 to 100 degrees    Time  4    Period  Weeks    Status  Achieved 11/29/17      PT SHORT TERM GOAL #3   Title  Patient to report decreased pain by 50% with ambulation    Time  4    Period  Weeks    Status  Achieved 11/29/17      PT SHORT TERM GOAL #4   Title  Patient able to perform bed mobility independently    Time  4    Period  Weeks    Status  Achieved        PT Long Term Goals - 12/27/17 1032      PT LONG TERM GOAL #1   Title  I with advanced HEP    Time  8    Period  Weeks    Status  Achieved      PT LONG TERM GOAL #2   Title  Patient able to ambulate community distances safely without AD.    Time  8    Period  Weeks     Status  On-going using SPC currently 12/22/17      PT LONG TERM GOAL #3   Title  Patient to demo improved R knee ROM 0-120 deg to normalize gait.    Time  8    Period  Weeks    Status  Partially Met AROM 0-105 deg 12/27/17      PT LONG TERM GOAL #4   Title  Patient to demo 4+/5 RLE hip and knee strength to improve function.    Time  8    Period  Weeks    Status  Partially Met R knee extensors 4+/5, R knee flexors 4/5 as of 12/27/2017      PT LONG TERM GOAL #5   Title  Patient to perform ADLs with 2/10 pain or less in R knee.    Time  8    Period  Weeks    Status  On-going            Plan - 12/31/17 1144    Clinical Impression Statement  Patient continues to perform well in therapy with proper form. Patient noted with increased difficulty secondary to Right quadriceps weakness during step downs. Patient reported she is still descending stairs backwards. Continue with step downs to improve strength, stability and confidence to descend normally. Patient inquired using treadmill to improve gait; PT informed pt to practice walking on level grounds first for safety. Patient reported understanding and agreement. Normal response to modalities upon removal of vasopneumatic device.     Clinical Presentation  Stable    Clinical Decision Making  Low    Rehab Potential  Excellent    PT Frequency  3x / week    PT Duration  4 weeks    PT Next Visit Plan  Forward Step downs,  Cont with POC for ROM, strengthening and modalities PRN     Consulted and Agree with Plan of Care  Patient       Patient will benefit from skilled therapeutic intervention in order to improve the following deficits and impairments:  Abnormal gait, Increased edema, Pain, Decreased range of motion, Impaired flexibility, Decreased strength  Visit Diagnosis: Stiffness of right knee, not elsewhere classified  Localized edema  Muscle weakness (generalized)  Acute pain of right knee     Problem List Patient Active  Problem List   Diagnosis Date Noted  . OA (osteoarthritis) of knee 10/25/2017  . Well woman exam with routine gynecological exam 06/16/2017  . Vitamin D deficiency 03/25/2016   Gabriela Eves, PT, DPT 12/31/2017, 12:03 PM  [facility] [address] [place], [place], [address] Phone: [phone]   Fax:  [phone]  Name: Sarah Hicks MRN: [id number] Date of Birth: March 25, 1961

## 2018-01-04 ENCOUNTER — Ambulatory Visit: Payer: 59 | Admitting: Physical Therapy

## 2018-01-04 ENCOUNTER — Encounter: Payer: Self-pay | Admitting: Physical Therapy

## 2018-01-04 DIAGNOSIS — M6281 Muscle weakness (generalized): Secondary | ICD-10-CM

## 2018-01-04 DIAGNOSIS — M25661 Stiffness of right knee, not elsewhere classified: Secondary | ICD-10-CM

## 2018-01-04 DIAGNOSIS — M25561 Pain in right knee: Secondary | ICD-10-CM

## 2018-01-04 DIAGNOSIS — R600 Localized edema: Secondary | ICD-10-CM

## 2018-01-04 NOTE — Therapy (Signed)
[facility] [place], [place], [address] Phone: [phone]   Fax:  [phone]  Physical Therapy Treatment  Patient Details  Name: Sarah Hicks MRN: [id number] Date of Birth: [birth year] Referring Provider: Dr. Gaynelle Arabian   Encounter Date: 01/04/2018  PT End of Session - 01/04/18 0922    Visit Number  29    Number of Visits  32    Date for PT Re-Evaluation  02/04/18    PT Start Time  0906    PT Stop Time  0957    PT Time Calculation (min)  51 min    Activity Tolerance  Patient tolerated treatment well    Behavior During Therapy  [facility] for tasks assessed/performed       Past Medical History:  Diagnosis Date  . Arthritis    oa  . Complication of anesthesia    told to breathe twice after a morton's neuroma surgery 2003, did fine with other surgeries since  . GERD (gastroesophageal reflux disease)   . Headache   . History of kidney stones    passed on own  . Hypertension   . Medical history non-contributory   . Scoliosis   . Vitamin D deficiency 03/25/2016    Past Surgical History:  Procedure Laterality Date  . ANKLE SURGERY Right 10 year ago  . cervical cryoablation  1998  . CHOLECYSTECTOMY  17 years ago  . COLONOSCOPY N/A 09/13/2013   Procedure: COLONOSCOPY;  Surgeon: Rogene Houston, MD;  [facility];  Service: Endoscopy;  Laterality: N/A;  730-moved to 51 Ann to notify pt  . FOOT SURGERY     Morton's neuroma  . KNEE SURGERY Left 10 years ago   torn ligemant and tendon repair  . TOTAL KNEE ARTHROPLASTY Right 10/25/2017   Procedure: RIGHT TOTAL KNEE ARTHROPLASTY;  Surgeon: Gaynelle Arabian, MD;  [facility];  Service: Orthopedics;  Laterality: Right;  Adductor Block  . TUBAL LIGATION  19 years ago    There were no vitals filed for this visit.  Subjective Assessment - 01/04/18 0913    Subjective  Reports that her knee is tight but that is normal for the mornings. Reports that the taping  is still helping.    Pertinent History  R ankle surgery, L knee surgery    Patient Stated Goals  to get back to walking without pain    Currently in Pain?  Yes    Pain Score  4     Pain Location  Knee    Pain Orientation  Right    Pain Descriptors / Indicators  Tightness    Pain Type  Surgical pain    Pain Onset  More than a month ago         [facility] PT Assessment - 01/04/18 0001      Assessment   Medical Diagnosis  s/p R TKA    Onset Date/Surgical Date  10/25/17      Precautions   Precautions  Knee      Restrictions   Weight Bearing Restrictions  No            No data recorded       [facility] Adult PT Treatment/Exercise - 01/04/18 0001      Knee/Hip Exercises: Aerobic   Stationary Bike  level 4 x15 minutes      Knee/Hip Exercises: Machines for Strengthening   Cybex Knee Extension  10# x20    Cybex Knee Flexion  30# x20  Cybex Leg Press  1 pl, seat 5 x30 reps      Knee/Hip Exercises: Standing   Forward Lunges  Right;20 reps;5 seconds      Modalities   Modalities  Electrical Stimulation;Vasopneumatic      Acupuncturist Location  R knee    Electrical Stimulation Action  IFC    Electrical Stimulation Parameters  1-10 hz x15 min    Electrical Stimulation Goals  Edema      Vasopneumatic   Number Minutes Vasopneumatic   15 minutes    Vasopnuematic Location   Knee    Vasopneumatic Pressure  Medium    Vasopneumatic Temperature   34      Manual Therapy   Manual Therapy  Soft tissue mobilization    Soft tissue mobilization  RLE manual edema mobilizations (MEM) to reduce edema present following R TKR               PT Short Term Goals - 11/29/17 1125      PT SHORT TERM GOAL #1   Title  I with HEP    Time  4    Period  Weeks    Status  Achieved 11/29/17      PT SHORT TERM GOAL #2   Title  Improved R knee ROM -5 to 100 degrees    Time  4    Period  Weeks    Status  Achieved 11/29/17      PT SHORT TERM GOAL #3    Title  Patient to report decreased pain by 50% with ambulation    Time  4    Period  Weeks    Status  Achieved 11/29/17      PT SHORT TERM GOAL #4   Title  Patient able to perform bed mobility independently    Time  4    Period  Weeks    Status  Achieved        PT Long Term Goals - 12/27/17 1032      PT LONG TERM GOAL #1   Title  I with advanced HEP    Time  8    Period  Weeks    Status  Achieved      PT LONG TERM GOAL #2   Title  Patient able to ambulate community distances safely without AD.    Time  8    Period  Weeks    Status  On-going using SPC currently 12/22/17      PT LONG TERM GOAL #3   Title  Patient to demo improved R knee ROM 0-120 deg to normalize gait.    Time  8    Period  Weeks    Status  Partially Met AROM 0-105 deg 12/27/17      PT LONG TERM GOAL #4   Title  Patient to demo 4+/5 RLE hip and knee strength to improve function.    Time  8    Period  Weeks    Status  Partially Met R knee extensors 4+/5, R knee flexors 4/5 as of 12/27/2017      PT LONG TERM GOAL #5   Title  Patient to perform ADLs with 2/10 pain or less in R knee.    Time  8    Period  Weeks    Status  On-going            Plan - 01/04/18 0956    Clinical Impression Statement  Patient tolerated  today's treatment well with reports of weakness with leg press as 2 pl attempted but unable to. No complaints with any exercises only of R knee tightness which is normal for her in the mornings. Patient continues to report increased swelling in inferior R knee. Patient able to tolerate MEM of RLE was well education regarding technique and rationale. Normal modalities response noted following removal of the modalities.    Rehab Potential  Excellent    PT Frequency  3x / week    PT Duration  4 weeks    PT Treatment/Interventions  ADLs/Self Care Home Management;Cryotherapy;Occupational psychologist;Therapeutic exercise;Manual techniques;Patient/family  education;Neuromuscular re-education;Passive range of motion;Taping;Vasopneumatic Device    PT Next Visit Plan  Forward Step downs, Cont with POC for ROM, strengthening and modalities PRN     PT Home Exercise Plan  Continue HEP for ROM, instructed pt in prone knee hangs     Consulted and Agree with Plan of Care  Patient       Patient will benefit from skilled therapeutic intervention in order to improve the following deficits and impairments:  Abnormal gait, Increased edema, Pain, Decreased range of motion, Impaired flexibility, Decreased strength  Visit Diagnosis: Stiffness of right knee, not elsewhere classified  Localized edema  Muscle weakness (generalized)  Acute pain of right knee     Problem List Patient Active Problem List   Diagnosis Date Noted  . OA (osteoarthritis) of knee 10/25/2017  . Well woman exam with routine gynecological exam 06/16/2017  . Vitamin D deficiency 03/25/2016    Standley Brooking, PTA 01/04/2018, 10:33 AM  [facility] [address] [place], [place], [address] Phone: [phone]   Fax:  [phone]  Name: Sarah Hicks MRN: [id number] Date of Birth: 06-21-1961

## 2018-01-06 ENCOUNTER — Ambulatory Visit: Payer: 59 | Admitting: Physical Therapy

## 2018-01-06 ENCOUNTER — Encounter: Payer: Self-pay | Admitting: Physical Therapy

## 2018-01-06 DIAGNOSIS — M25661 Stiffness of right knee, not elsewhere classified: Secondary | ICD-10-CM

## 2018-01-06 DIAGNOSIS — M6281 Muscle weakness (generalized): Secondary | ICD-10-CM

## 2018-01-06 DIAGNOSIS — R600 Localized edema: Secondary | ICD-10-CM

## 2018-01-06 DIAGNOSIS — M25561 Pain in right knee: Secondary | ICD-10-CM

## 2018-01-06 NOTE — Therapy (Signed)
[facility] [place], [place], [address] Phone: [phone]   Fax:  [phone]  Physical Therapy Treatment  Patient Details  Name: Sarah Hicks MRN: [id number] Date of Birth: 1961-05-27 Referring Provider: Dr. Gaynelle Arabian   Encounter Date: 01/06/2018  PT End of Session - 01/06/18 1118    Visit Number  30    Number of Visits  32    Date for PT Re-Evaluation  02/04/18    PT Start Time  1031    PT Stop Time  1132    PT Time Calculation (min)  61 min    Activity Tolerance  Patient tolerated treatment well    Behavior During Therapy  [facility] for tasks assessed/performed       Past Medical History:  Diagnosis Date  . Arthritis    oa  . Complication of anesthesia    told to breathe twice after a morton's neuroma surgery 2003, did fine with other surgeries since  . GERD (gastroesophageal reflux disease)   . Headache   . History of kidney stones    passed on own  . Hypertension   . Medical history non-contributory   . Scoliosis   . Vitamin D deficiency 03/25/2016    Past Surgical History:  Procedure Laterality Date  . ANKLE SURGERY Right 10 year ago  . cervical cryoablation  1998  . CHOLECYSTECTOMY  17 years ago  . COLONOSCOPY N/A 09/13/2013   Procedure: COLONOSCOPY;  Surgeon: Rogene Houston, MD;  [facility];  Service: Endoscopy;  Laterality: N/A;  730-moved to 26 Ann to notify pt  . FOOT SURGERY     Morton's neuroma  . KNEE SURGERY Left 10 years ago   torn ligemant and tendon repair  . TOTAL KNEE ARTHROPLASTY Right 10/25/2017   Procedure: RIGHT TOTAL KNEE ARTHROPLASTY;  Surgeon: Gaynelle Arabian, MD;  [facility];  Service: Orthopedics;  Laterality: Right;  Adductor Block  . TUBAL LIGATION  19 years ago    There were no vitals filed for this visit.  Subjective Assessment - 01/06/18 1054    Subjective  Patient arrived with increased tightness and discomfort    Pertinent History  R ankle  surgery, L knee surgery    Patient Stated Goals  to get back to walking without pain    Currently in Pain?  Yes    Pain Score  5     Pain Location  Knee    Pain Orientation  Right    Pain Descriptors / Indicators  Tightness;Discomfort;Sore    Pain Type  Surgical pain    Pain Onset  More than a month ago    Pain Frequency  Intermittent    Aggravating Factors   ROM for flexion    Pain Relieving Factors  rest         [facility] PT Assessment - 01/06/18 0001      AROM   AROM Assessment Site  Knee    Right/Left Knee  Right    Right Knee Flexion  98      PROM   PROM Assessment Site  Knee    Right/Left Knee  Right    Right Knee Flexion  109            No data recorded       [facility] Adult PT Treatment/Exercise - 01/06/18 0001      Knee/Hip Exercises: Aerobic   Stationary Bike  11mn L5      Electrical Stimulation  Electrical Stimulation Location  R knee    Chartered certified accountant  IFC    Electrical Stimulation Parameters  1-_0  x11mn    Electrical Stimulation Goals  Edema      Vasopneumatic   Number Minutes Vasopneumatic   15 minutes    Vasopnuematic Location   Knee    Vasopneumatic Pressure  Medium      Manual Therapy   Manual Therapy  Soft tissue mobilization;Myofascial release;Joint mobilization;Passive ROM;Taping    Manual therapy comments  Spidering taping medially and laterally to R knee to reduce inflammation    Joint Mobilization  patella mobs all directions    Soft tissue mobilization  manual STW to ant and medial knee    Myofascial Release  IASTM to right knee supirior and inferier to insision area    Passive ROM  PROM of R knee into flexion in supine to progress ROM               PT Short Term Goals - 11/29/17 1125      PT SHORT TERM GOAL #1   Title  I with HEP    Time  4    Period  Weeks    Status  Achieved 11/29/17      PT SHORT TERM GOAL #2   Title  Improved R knee ROM -5 to 100 degrees    Time  4    Period  Weeks    Status   Achieved 11/29/17      PT SHORT TERM GOAL #3   Title  Patient to report decreased pain by 50% with ambulation    Time  4    Period  Weeks    Status  Achieved 11/29/17      PT SHORT TERM GOAL #4   Title  Patient able to perform bed mobility independently    Time  4    Period  Weeks    Status  Achieved        PT Long Term Goals - 12/27/17 1032      PT LONG TERM GOAL #1   Title  I with advanced HEP    Time  8    Period  Weeks    Status  Achieved      PT LONG TERM GOAL #2   Title  Patient able to ambulate community distances safely without AD.    Time  8    Period  Weeks    Status  On-going using SPC currently 12/22/17      PT LONG TERM GOAL #3   Title  Patient to demo improved R knee ROM 0-120 deg to normalize gait.    Time  8    Period  Weeks    Status  Partially Met AROM 0-105 deg 12/27/17      PT LONG TERM GOAL #4   Title  Patient to demo 4+/5 RLE hip and knee strength to improve function.    Time  8    Period  Weeks    Status  Partially Met R knee extensors 4+/5, R knee flexors 4/5 as of 12/27/2017      PT LONG TERM GOAL #5   Title  Patient to perform ADLs with 2/10 pain or less in R knee.    Time  8    Period  Weeks    Status  On-going            Plan - 01/06/18 1121    Clinical Impression Statement  Patient arrived with increased tightness and discomfort in right knee. Today focused on manual STW/scar massage and passive stretching to reduce edema, pain and increase flexion ROM. Patient current goals ongoing at this time.     Rehab Potential  Excellent    PT Frequency  3x / week    PT Duration  4 weeks    PT Treatment/Interventions  ADLs/Self Care Home Management;Cryotherapy;Occupational psychologist;Therapeutic exercise;Manual techniques;Patient/family education;Neuromuscular re-education;Passive range of motion;Taping;Vasopneumatic Device    PT Next Visit Plan  Forward Step downs, Cont with POC for ROM, strengthening and  modalities PRN     Consulted and Agree with Plan of Care  Patient       Patient will benefit from skilled therapeutic intervention in order to improve the following deficits and impairments:  Abnormal gait, Increased edema, Pain, Decreased range of motion, Impaired flexibility, Decreased strength  Visit Diagnosis: Stiffness of right knee, not elsewhere classified  Localized edema  Muscle weakness (generalized)  Acute pain of right knee     Problem List Patient Active Problem List   Diagnosis Date Noted  . OA (osteoarthritis) of knee 10/25/2017  . Well woman exam with routine gynecological exam 06/16/2017  . Vitamin D deficiency 03/25/2016    Phillips Climes, PTA 01/06/2018, 11:33 AM  [facility] [place], [place], [address] Phone: [phone]   Fax:  [phone]  Name: Sarah Hicks MRN: [id number] Date of Birth: Mar 28, 1961

## 2018-01-10 ENCOUNTER — Encounter: Payer: 59 | Admitting: Physical Therapy

## 2018-01-11 ENCOUNTER — Ambulatory Visit: Payer: 59 | Attending: Orthopedic Surgery | Admitting: Physical Therapy

## 2018-01-11 DIAGNOSIS — M6281 Muscle weakness (generalized): Secondary | ICD-10-CM

## 2018-01-11 DIAGNOSIS — M25661 Stiffness of right knee, not elsewhere classified: Secondary | ICD-10-CM | POA: Diagnosis present

## 2018-01-11 DIAGNOSIS — M25561 Pain in right knee: Secondary | ICD-10-CM | POA: Diagnosis present

## 2018-01-11 DIAGNOSIS — R600 Localized edema: Secondary | ICD-10-CM

## 2018-01-11 NOTE — Therapy (Signed)
[facility] [place], [place], [address] Phone: [phone]   Fax:  [phone]  Physical Therapy Treatment  Patient Details  Name: Sarah Hicks MRN: [id number] Date of Birth: [birth year] Referring Provider: Dr. Gaynelle Arabian   Encounter Date: 01/11/2018  PT End of Session - 01/11/18 1038    Visit Number  31    Number of Visits  32    Date for PT Re-Evaluation  02/04/18    PT Start Time  6063    PT Stop Time  1127    PT Time Calculation (min)  52 min    Activity Tolerance  Patient tolerated treatment well    Behavior During Therapy  [facility] for tasks assessed/performed       Past Medical History:  Diagnosis Date  . Arthritis    oa  . Complication of anesthesia    told to breathe twice after a morton's neuroma surgery 2003, did fine with other surgeries since  . GERD (gastroesophageal reflux disease)   . Headache   . History of kidney stones    passed on own  . Hypertension   . Medical history non-contributory   . Scoliosis   . Vitamin D deficiency 03/25/2016    Past Surgical History:  Procedure Laterality Date  . ANKLE SURGERY Right 10 year ago  . cervical cryoablation  1998  . CHOLECYSTECTOMY  17 years ago  . COLONOSCOPY N/A 09/13/2013   Procedure: COLONOSCOPY;  Surgeon: Rogene Houston, MD;  [facility];  Service: Endoscopy;  Laterality: N/A;  730-moved to 75 Ann to notify pt  . FOOT SURGERY     Morton's neuroma  . KNEE SURGERY Left 10 years ago   torn ligemant and tendon repair  . TOTAL KNEE ARTHROPLASTY Right 10/25/2017   Procedure: RIGHT TOTAL KNEE ARTHROPLASTY;  Surgeon: Gaynelle Arabian, MD;  Location: [facility];  Service: Orthopedics;  Laterality: Right;  Adductor Block  . TUBAL LIGATION  19 years ago    There were no vitals filed for this visit.  Subjective Assessment - 01/11/18 1038    Subjective  Patient reported her husband massaged her hamstrings over the weekend and she felt that  loosened everything up. She reports she does not have any pain, just soreness.    Pertinent History  R ankle surgery, L knee surgery    Patient Stated Goals  to get back to walking without pain    Currently in Pain?  No/denies         [facility] PT Assessment - 01/11/18 0001      Assessment   Medical Diagnosis  s/p R TKA    Onset Date/Surgical Date  10/25/17    Next MD Visit  late April 2019                   [facility] Adult PT Treatment/Exercise - 01/11/18 0001      Knee/Hip Exercises: Stretches   Knee: Self-Stretch to increase Flexion  Right;3 reps;30 seconds on knee flexion machine      Knee/Hip Exercises: Aerobic   Nustep  Level 5 x10 mins seat 10 to seat 8 to improve knee flexion      Knee/Hip Exercises: Standing   Step Down  Right;2 sets;10 reps;Hand Hold: 2;Hand Hold: 1;Step Height: 4" followed by 1 hand hold x5      Modalities   Modalities  Electrical Stimulation;Vasopneumatic      Electrical Stimulation   Electrical Stimulation Location  R knee  Electrical Stimulation Action  IFC    Electrical Stimulation Parameters  80-150 hz x10    Electrical Stimulation Goals  Pain;Edema      Vasopneumatic   Number Minutes Vasopneumatic   10 minutes    Vasopnuematic Location   Knee    Vasopneumatic Pressure  Low      Manual Therapy   Manual Therapy  Soft tissue mobilization;Joint mobilization;Passive ROM    Joint Mobilization  patella mobs all directions    Passive ROM  PROM of R knee into flexion in sitting to progress ROM, PNF contract relax techniques               PT Short Term Goals - 11/29/17 1125      PT SHORT TERM GOAL #1   Title  I with HEP    Time  4    Period  Weeks    Status  Achieved 11/29/17      PT SHORT TERM GOAL #2   Title  Improved R knee ROM -5 to 100 degrees    Time  4    Period  Weeks    Status  Achieved 11/29/17      PT SHORT TERM GOAL #3   Title  Patient to report decreased pain by 50% with ambulation    Time  4    Period   Weeks    Status  Achieved 11/29/17      PT SHORT TERM GOAL #4   Title  Patient able to perform bed mobility independently    Time  4    Period  Weeks    Status  Achieved        PT Long Term Goals - 12/27/17 1032      PT LONG TERM GOAL #1   Title  I with advanced HEP    Time  8    Period  Weeks    Status  Achieved      PT LONG TERM GOAL #2   Title  Patient able to ambulate community distances safely without AD.    Time  8    Period  Weeks    Status  On-going using SPC currently 12/22/17      PT LONG TERM GOAL #3   Title  Patient to demo improved R knee ROM 0-120 deg to normalize gait.    Time  8    Period  Weeks    Status  Partially Met AROM 0-105 deg 12/27/17      PT LONG TERM GOAL #4   Title  Patient to demo 4+/5 RLE hip and knee strength to improve function.    Time  8    Period  Weeks    Status  Partially Met R knee extensors 4+/5, R knee flexors 4/5 as of 12/27/2017      PT LONG TERM GOAL #5   Title  Patient to perform ADLs with 2/10 pain or less in R knee.    Time  8    Period  Weeks    Status  On-going            Plan - 01/11/18 1248    Clinical Impression Statement  Patient was able to tolerate treatment well; she reported increased tightness with knee flexion stretch on knee flexion machine. Continue with step downs as patient continues to decend steps backwards at home. She reports she does not feel confident in her ability to descend normally. Normal response to modalities upon removal.  Clinical Presentation  Stable    Clinical Decision Making  Low    Rehab Potential  Excellent    PT Frequency  3x / week    PT Duration  4 weeks    PT Treatment/Interventions  ADLs/Self Care Home Management;Cryotherapy;Occupational psychologist;Therapeutic exercise;Manual techniques;Patient/family education;Neuromuscular re-education;Passive range of motion;Taping;Vasopneumatic Device    PT Next Visit Plan  Forward Step downs, Cont with POC  for ROM, strengthening and modalities PRN     Consulted and Agree with Plan of Care  Patient       Patient will benefit from skilled therapeutic intervention in order to improve the following deficits and impairments:  Abnormal gait, Increased edema, Pain, Decreased range of motion, Impaired flexibility, Decreased strength  Visit Diagnosis: Stiffness of right knee, not elsewhere classified  Localized edema  Muscle weakness (generalized)  Acute pain of right knee     Problem List Patient Active Problem List   Diagnosis Date Noted  . OA (osteoarthritis) of knee 10/25/2017  . Well woman exam with routine gynecological exam 06/16/2017  . Vitamin D deficiency 03/25/2016    Sarah Hicks 01/11/2018, 12:52 PM  [facility] [address] [place], [place], [address] Phone: [phone]   Fax:  [phone]  Name: Sarah Hicks MRN: [id number] Date of Birth: Nov 20, 1960

## 2018-01-13 ENCOUNTER — Ambulatory Visit: Payer: 59 | Admitting: Physical Therapy

## 2018-01-13 ENCOUNTER — Encounter: Payer: Self-pay | Admitting: Physical Therapy

## 2018-01-13 DIAGNOSIS — M25561 Pain in right knee: Secondary | ICD-10-CM

## 2018-01-13 DIAGNOSIS — M6281 Muscle weakness (generalized): Secondary | ICD-10-CM

## 2018-01-13 DIAGNOSIS — M25661 Stiffness of right knee, not elsewhere classified: Secondary | ICD-10-CM | POA: Diagnosis not present

## 2018-01-13 DIAGNOSIS — R600 Localized edema: Secondary | ICD-10-CM

## 2018-01-13 NOTE — Therapy (Signed)
[facility] [place], [place], [address] Phone: [phone]   Fax:  [phone]  Physical Therapy Treatment  Patient Details  Name: Sarah Hicks MRN: [id number] Date of Birth: 10-22-1960 Referring Provider: Dr. Gaynelle Arabian   Encounter Date: 01/13/2018  PT End of Session - 01/13/18 1112    Visit Number  32    Number of Visits  40    Date for PT Re-Evaluation  02/04/18    PT Start Time  1031    PT Stop Time  1131    PT Time Calculation (min)  60 min    Activity Tolerance  Patient tolerated treatment well    Behavior During Therapy  [facility] for tasks assessed/performed       Past Medical History:  Diagnosis Date  . Arthritis    oa  . Complication of anesthesia    told to breathe twice after a morton's neuroma surgery 2003, did fine with other surgeries since  . GERD (gastroesophageal reflux disease)   . Headache   . History of kidney stones    passed on own  . Hypertension   . Medical history non-contributory   . Scoliosis   . Vitamin D deficiency 03/25/2016    Past Surgical History:  Procedure Laterality Date  . ANKLE SURGERY Right 10 year ago  . cervical cryoablation  1998  . CHOLECYSTECTOMY  17 years ago  . COLONOSCOPY N/A 09/13/2013   Procedure: COLONOSCOPY;  Surgeon: Rogene Houston, MD;  [facility];  Service: Endoscopy;  Laterality: N/A;  730-moved to 48 Ann to notify pt  . FOOT SURGERY     Morton's neuroma  . KNEE SURGERY Left 10 years ago   torn ligemant and tendon repair  . TOTAL KNEE ARTHROPLASTY Right 10/25/2017   Procedure: RIGHT TOTAL KNEE ARTHROPLASTY;  Surgeon: Gaynelle Arabian, MD;  [facility];  Service: Orthopedics;  Laterality: Right;  Adductor Block  . TUBAL LIGATION  19 years ago    There were no vitals filed for this visit.  Subjective Assessment - 01/13/18 1034    Subjective  Patient reported tightness in knee yet no pain at this time.    Pertinent History  R ankle  surgery, L knee surgery    Patient Stated Goals  to get back to walking without pain    Currently in Pain?  No/denies         [facility] PT Assessment - 01/13/18 0001      AROM   AROM Assessment Site  Knee    Right/Left Knee  Right    Right Knee Flexion  100      PROM   PROM Assessment Site  Knee    Right/Left Knee  Right    Right Knee Flexion  111                   [facility] Adult PT Treatment/Exercise - 01/13/18 0001      Knee/Hip Exercises: Aerobic   Nustep  Level 5 x10 mins       Knee/Hip Exercises: Standing   Terminal Knee Extension  Strengthening;Right;20 reps;10 reps;Theraband;Limitations;Other (comment)    Theraband Level (Terminal Knee Extension)  Other (comment)    Terminal Knee Extension Limitations  pink XTS    Forward Step Up  Right;20 reps;Hand Hold: 0;Step Height: 4"    Step Down  Right;20 reps;Hand Hold: 1;Step Height: 4"      Acupuncturist Location  R  knee    Electrical Stimulation Action  IFC    Electrical Stimulation Parameters  80-150hz  x32mn    Electrical Stimulation Goals  Pain;Edema;Other (comment) per request after tx      Vasopneumatic   Number Minutes Vasopneumatic   15 minutes    Vasopnuematic Location   Knee    Vasopneumatic Pressure  Low      Manual Therapy   Manual Therapy  Passive ROM    Joint Mobilization  patella mobs all directions    Passive ROM  PROM of R knee into flexion with low load holds               PT Short Term Goals - 11/29/17 1125      PT SHORT TERM GOAL #1   Title  I with HEP    Time  4    Period  Weeks    Status  Achieved 11/29/17      PT SHORT TERM GOAL #2   Title  Improved R knee ROM -5 to 100 degrees    Time  4    Period  Weeks    Status  Achieved 11/29/17      PT SHORT TERM GOAL #3   Title  Patient to report decreased pain by 50% with ambulation    Time  4    Period  Weeks    Status  Achieved 11/29/17      PT SHORT TERM GOAL #4   Title  Patient able to  perform bed mobility independently    Time  4    Period  Weeks    Status  Achieved        PT Long Term Goals - 12/27/17 1032      PT LONG TERM GOAL #1   Title  I with advanced HEP    Time  8    Period  Weeks    Status  Achieved      PT LONG TERM GOAL #2   Title  Patient able to ambulate community distances safely without AD.    Time  8    Period  Weeks    Status  On-going using SPC currently 12/22/17      PT LONG TERM GOAL #3   Title  Patient to demo improved R knee ROM 0-120 deg to normalize gait.    Time  8    Period  Weeks    Status  Partially Met AROM 0-105 deg 12/27/17      PT LONG TERM GOAL #4   Title  Patient to demo 4+/5 RLE hip and knee strength to improve function.    Time  8    Period  Weeks    Status  Partially Met R knee extensors 4+/5, R knee flexors 4/5 as of 12/27/2017      PT LONG TERM GOAL #5   Title  Patient to perform ADLs with 2/10 pain or less in R knee.    Time  8    Period  Weeks    Status  On-going            Plan - 01/13/18 1118    Clinical Impression Statement  Patient tolerated treatment well today. Patient progressing with right knee strengthening activities with good quad contol with step ups, some difficulty with step downs. [facility] and will try to transition to no assist device slowly. Patient has decreased discomfort overall and at rest  with some pain with knee stretching. Patient has ongoing tightness with knee flexion today yet was able to improve after manual stretching. Goals ongoing at this time.     Rehab Potential  Excellent    PT Frequency  3x / week    PT Duration  4 weeks    PT Treatment/Interventions  ADLs/Self Care Home Management;Cryotherapy;Occupational psychologist;Therapeutic exercise;Manual techniques;Patient/family education;Neuromuscular re-education;Passive range of motion;Taping;Vasopneumatic Device    PT Next Visit Plan  Forward Step downs,  Cont with POC for ROM, strengthening and modalities PRN     Consulted and Agree with Plan of Care  Patient       Patient will benefit from skilled therapeutic intervention in order to improve the following deficits and impairments:  Abnormal gait, Increased edema, Pain, Decreased range of motion, Impaired flexibility, Decreased strength  Visit Diagnosis: Stiffness of right knee, not elsewhere classified  Localized edema  Muscle weakness (generalized)  Acute pain of right knee     Problem List Patient Active Problem List   Diagnosis Date Noted  . OA (osteoarthritis) of knee 10/25/2017  . Well woman exam with routine gynecological exam 06/16/2017  . Vitamin D deficiency 03/25/2016    Phillips Climes, PTA 01/13/2018, 11:31 AM  [facility] [place], [place], [address] Phone: [phone]   Fax:  [phone]  Name: Sarah Hicks MRN: [id number] Date of Birth: 27-Dec-1960

## 2018-01-17 ENCOUNTER — Ambulatory Visit: Payer: 59 | Admitting: Physical Therapy

## 2018-01-17 ENCOUNTER — Encounter: Payer: Self-pay | Admitting: Physical Therapy

## 2018-01-17 DIAGNOSIS — M6281 Muscle weakness (generalized): Secondary | ICD-10-CM

## 2018-01-17 DIAGNOSIS — M25661 Stiffness of right knee, not elsewhere classified: Secondary | ICD-10-CM

## 2018-01-17 DIAGNOSIS — R600 Localized edema: Secondary | ICD-10-CM

## 2018-01-17 DIAGNOSIS — M25561 Pain in right knee: Secondary | ICD-10-CM

## 2018-01-17 NOTE — Therapy (Signed)
[facility] [place], [place], [address] Phone: [phone]   Fax:  [phone]  Physical Therapy Treatment  Patient Details  Name: Sarah Hicks MRN: [id number] Date of Birth: 1961/05/08 Referring Provider: Dr. Gaynelle Arabian   Encounter Date: 01/17/2018  PT End of Session - 01/17/18 1103    Visit Number  33    Number of Visits  40    Date for PT Re-Evaluation  02/04/18    PT Start Time  1032    PT Stop Time  1127    PT Time Calculation (min)  55 min    Activity Tolerance  Patient tolerated treatment well    Behavior During Therapy  [facility] for tasks assessed/performed       Past Medical History:  Diagnosis Date  . Arthritis    oa  . Complication of anesthesia    told to breathe twice after a morton's neuroma surgery 2003, did fine with other surgeries since  . GERD (gastroesophageal reflux disease)   . Headache   . History of kidney stones    passed on own  . Hypertension   . Medical history non-contributory   . Scoliosis   . Vitamin D deficiency 03/25/2016    Past Surgical History:  Procedure Laterality Date  . ANKLE SURGERY Right 10 year ago  . cervical cryoablation  1998  . CHOLECYSTECTOMY  17 years ago  . COLONOSCOPY N/A 09/13/2013   Procedure: COLONOSCOPY;  Surgeon: Rogene Houston, MD;  [facility];  Service: Endoscopy;  Laterality: N/A;  730-moved to 4 Ann to notify pt  . FOOT SURGERY     Morton's neuroma  . KNEE SURGERY Left 10 years ago   torn ligemant and tendon repair  . TOTAL KNEE ARTHROPLASTY Right 10/25/2017   Procedure: RIGHT TOTAL KNEE ARTHROPLASTY;  Surgeon: Gaynelle Arabian, MD;  Location[facility];  Service: Orthopedics;  Laterality: Right;  Adductor Block  . TUBAL LIGATION  19 years ago    There were no vitals filed for this visit.  Subjective Assessment - 01/17/18 1034    Subjective  Patient arrived with no can today and doing well    Pertinent History  R ankle surgery, L  knee surgery    Patient Stated Goals  to get back to walking without pain    Currently in Pain?  No/denies         [facility] PT Assessment - 01/17/18 0001      AROM   AROM Assessment Site  Knee    Right/Left Knee  Right    Right Knee Flexion  102      PROM   PROM Assessment Site  Knee    Right/Left Knee  Right    Right Knee Flexion  110                   [facility] Adult PT Treatment/Exercise - 01/17/18 0001      Knee/Hip Exercises: Aerobic   Nustep  Level 5 x10 mins       Knee/Hip Exercises: Standing   Terminal Knee Extension  Strengthening;Right;20 reps;10 reps;Theraband;Limitations;Other (comment)    Theraband Level (Terminal Knee Extension)  Other (comment)    Terminal Knee Extension Limitations  pink XTS    Forward Step Up  Right;20 reps;Hand Hold: 0;10 reps;Step Height: 6"    Step Down  Right;20 reps;Hand Hold: 1;Step Height: 6"      Knee/Hip Exercises: Supine   Other Supine Knee/Hip Exercises  Wall slides into flexion x10 reps       Vasopneumatic   Number Minutes Vasopneumatic   15 minutes    Vasopnuematic Location   Knee    Vasopneumatic Pressure  Low      Manual Therapy   Manual Therapy  Passive ROM    Joint Mobilization  patella mobs all directions    Passive ROM  PROM of R knee into flexion with low load holds               PT Short Term Goals - 11/29/17 1125      PT SHORT TERM GOAL #1   Title  I with HEP    Time  4    Period  Weeks    Status  Achieved 11/29/17      PT SHORT TERM GOAL #2   Title  Improved R knee ROM -5 to 100 degrees    Time  4    Period  Weeks    Status  Achieved 11/29/17      PT SHORT TERM GOAL #3   Title  Patient to report decreased pain by 50% with ambulation    Time  4    Period  Weeks    Status  Achieved 11/29/17      PT SHORT TERM GOAL #4   Title  Patient able to perform bed mobility independently    Time  4    Period  Weeks    Status  Achieved        PT Long Term Goals - 12/27/17 1032      PT  LONG TERM GOAL #1   Title  I with advanced HEP    Time  8    Period  Weeks    Status  Achieved      PT LONG TERM GOAL #2   Title  Patient able to ambulate community distances safely without AD.    Time  8    Period  Weeks    Status  On-going using SPC currently 12/22/17      PT LONG TERM GOAL #3   Title  Patient to demo improved R knee ROM 0-120 deg to normalize gait.    Time  8    Period  Weeks    Status  Partially Met AROM 0-105 deg 12/27/17      PT LONG TERM GOAL #4   Title  Patient to demo 4+/5 RLE hip and knee strength to improve function.    Time  8    Period  Weeks    Status  Partially Met R knee extensors 4+/5, R knee flexors 4/5 as of 12/27/2017      PT LONG TERM GOAL #5   Title  Patient to perform ADLs with 2/10 pain or less in R knee.    Time  8    Period  Weeks    Status  On-going            Plan - 01/17/18 1107    Clinical Impression Statement  Patient tolerated treatment well today. Patient has improved ROM after manual stretching today and progressing with right knee strengthening. Patient has ongoing tightness and edema in knee which restricts flexion ROM. Patient no longer using assist device and doing well with some slight antalgic gait. Goals ongoing.     Rehab Potential  Excellent    PT Frequency  3x / week    PT Duration  4 weeks    PT Treatment/Interventions  ADLs/Self Care Home Management;Cryotherapy;Occupational psychologist;Therapeutic exercise;Manual techniques;Patient/family education;Neuromuscular re-education;Passive range of motion;Taping;Vasopneumatic Device    PT Next Visit Plan  Forward Step downs, Cont with POC for ROM, strengthening and modalities PRN     Consulted and Agree with Plan of Care  Patient       Patient will benefit from skilled therapeutic intervention in order to improve the following deficits and impairments:  Abnormal gait, Increased edema, Pain, Decreased range of motion, Impaired flexibility,  Decreased strength  Visit Diagnosis: Stiffness of right knee, not elsewhere classified  Localized edema  Muscle weakness (generalized)  Acute pain of right knee     Problem List Patient Active Problem List   Diagnosis Date Noted  . OA (osteoarthritis) of knee 10/25/2017  . Well woman exam with routine gynecological exam 06/16/2017  . Vitamin D deficiency 03/25/2016    Phillips Climes, PTA 01/17/2018, 11:27 AM  [facility] [place], [place], [address] Phone: [phone]   Fax:  [phone]  Name: Sarah Hicks MRN: [id number] Date of Birth: 1961/01/15

## 2018-01-20 ENCOUNTER — Ambulatory Visit: Payer: 59 | Admitting: Physical Therapy

## 2018-01-20 ENCOUNTER — Encounter: Payer: Self-pay | Admitting: Physical Therapy

## 2018-01-20 DIAGNOSIS — R600 Localized edema: Secondary | ICD-10-CM

## 2018-01-20 DIAGNOSIS — M6281 Muscle weakness (generalized): Secondary | ICD-10-CM

## 2018-01-20 DIAGNOSIS — M25561 Pain in right knee: Secondary | ICD-10-CM

## 2018-01-20 DIAGNOSIS — M25661 Stiffness of right knee, not elsewhere classified: Secondary | ICD-10-CM

## 2018-01-20 NOTE — Therapy (Signed)
[facility] [place], [place], [address] Phone: [phone]   Fax:  [phone]  Physical Therapy Treatment  Patient Details  Name: Sarah Hicks MRN: [id number] Date of Birth: [birth year] Referring Provider: Dr. Gaynelle Arabian   Encounter Date: 01/20/2018  PT End of Session - 01/20/18 1108    Visit Number  34    Number of Visits  40    Date for PT Re-Evaluation  02/04/18    PT Start Time  1032    PT Stop Time  1128    PT Time Calculation (min)  56 min    Activity Tolerance  Patient tolerated treatment well    Behavior During Therapy  [facility] for tasks assessed/performed       Past Medical History:  Diagnosis Date  . Arthritis    oa  . Complication of anesthesia    told to breathe twice after a morton's neuroma surgery 2003, did fine with other surgeries since  . GERD (gastroesophageal reflux disease)   . Headache   . History of kidney stones    passed on own  . Hypertension   . Medical history non-contributory   . Scoliosis   . Vitamin D deficiency 03/25/2016    Past Surgical History:  Procedure Laterality Date  . ANKLE SURGERY Right 10 year ago  . cervical cryoablation  1998  . CHOLECYSTECTOMY  17 years ago  . COLONOSCOPY N/A 09/13/2013   Procedure: COLONOSCOPY;  Surgeon: Rogene Houston, MD;  [facility];  Service: Endoscopy;  Laterality: N/A;  730-moved to 65 Ann to notify pt  . FOOT SURGERY     Morton's neuroma  . KNEE SURGERY Left 10 years ago   torn ligemant and tendon repair  . TOTAL KNEE ARTHROPLASTY Right 10/25/2017   Procedure: RIGHT TOTAL KNEE ARTHROPLASTY;  Surgeon: Gaynelle Arabian, MD;  Location: [facility];  Service: Orthopedics;  Laterality: Right;  Adductor Block  . TUBAL LIGATION  19 years ago    There were no vitals filed for this visit.  Subjective Assessment - 01/20/18 1034    Subjective  Patient doing well today    Pertinent History  R ankle surgery, L knee surgery    Patient  Stated Goals  to get back to walking without pain    Currently in Pain?  No/denies         [facility] PT Assessment - 01/20/18 0001      AROM   AROM Assessment Site  Knee    Right/Left Knee  Right    Right Knee Flexion  101      PROM   PROM Assessment Site  Knee    Right/Left Knee  Right    Right Knee Flexion  113                   [facility] Adult PT Treatment/Exercise - 01/20/18 0001      Knee/Hip Exercises: Aerobic   Nustep  Level 5 x10 mins       Knee/Hip Exercises: Standing   Terminal Knee Extension  Strengthening;Right;20 reps;10 reps;Theraband;Limitations;Other (comment)    Theraband Level (Terminal Knee Extension)  Other (comment)    Terminal Knee Extension Limitations  pink XTS      Knee/Hip Exercises: Seated   Long Arc Quad  Strengthening;Right;3 sets;10 reps;Weights    Long Arc Quad Weight  5 lbs.      Knee/Hip Exercises: Supine   Other Supine Knee/Hip Exercises  Wall slides into flexion  x10 reps       Vasopneumatic   Number Minutes Vasopneumatic   15 minutes    Vasopnuematic Location   Knee    Vasopneumatic Pressure  Low      Manual Therapy   Manual Therapy  Passive ROM    Joint Mobilization  patella mobs all directions    Myofascial Release  IASTM to right knee supirior and inferier to insision area    Passive ROM  PROM of R knee into flexion with low load holds               PT Short Term Goals - 11/29/17 1125      PT SHORT TERM GOAL #1   Title  I with HEP    Time  4    Period  Weeks    Status  Achieved 11/29/17      PT SHORT TERM GOAL #2   Title  Improved R knee ROM -5 to 100 degrees    Time  4    Period  Weeks    Status  Achieved 11/29/17      PT SHORT TERM GOAL #3   Title  Patient to report decreased pain by 50% with ambulation    Time  4    Period  Weeks    Status  Achieved 11/29/17      PT SHORT TERM GOAL #4   Title  Patient able to perform bed mobility independently    Time  4    Period  Weeks    Status  Achieved         PT Long Term Goals - 01/20/18 1109      PT LONG TERM GOAL #1   Title  I with advanced HEP    Time  8    Period  Weeks    Status  Achieved      PT LONG TERM GOAL #2   Title  Patient able to ambulate community distances safely without AD.    Time  8    Period  Weeks    Status  Achieved      PT LONG TERM GOAL #3   Title  Patient to demo improved R knee ROM 0-120 deg to normalize gait.    Time  8    Period  Weeks    Status  Partially Met      PT LONG TERM GOAL #4   Title  Patient to demo 4+/5 RLE hip and knee strength to improve function.    Time  8    Period  Weeks    Status  Partially Met      PT LONG TERM GOAL #5   Title  Patient to perform ADLs with 2/10 pain or less in R knee.    Time  8    Period  Weeks    Status  On-going            Plan - 01/20/18 1110    Clinical Impression Statement  Patient tolerated treatment well today. Patient has improved ROM after manual stretching today. Patient able to walk independently with no assit device. Patient continnues to have some ongoing tightness in knee yet is doing self stretches daily. Met LTG #2 today others ongoing.     Rehab Potential  Excellent    PT Frequency  3x / week    PT Duration  4 weeks    PT Treatment/Interventions  ADLs/Self Care Home Management;Cryotherapy;Occupational psychologist;Therapeutic exercise;Manual techniques;Patient/family education;Neuromuscular  re-education;Passive range of motion;Taping;Vasopneumatic Device    PT Next Visit Plan  Forward Step downs, Cont with POC for ROM, strengthening and modalities PRN     Consulted and Agree with Plan of Care  Patient       Patient will benefit from skilled therapeutic intervention in order to improve the following deficits and impairments:  Abnormal gait, Increased edema, Pain, Decreased range of motion, Impaired flexibility, Decreased strength  Visit Diagnosis: Stiffness of right knee, not elsewhere  classified  Localized edema  Muscle weakness (generalized)  Acute pain of right knee     Problem List Patient Active Problem List   Diagnosis Date Noted  . OA (osteoarthritis) of knee 10/25/2017  . Well woman exam with routine gynecological exam 06/16/2017  . Vitamin D deficiency 03/25/2016    Phillips Climes, PTA 01/20/2018, 11:31 AM  [facility] [place], [place], [address] Phone: [phone]   Fax:  [phone]  Name: YLONDA STORR MRN: [id number] Date of Birth: 1961-01-31

## 2018-01-24 ENCOUNTER — Encounter: Payer: Self-pay | Admitting: Physical Therapy

## 2018-01-24 ENCOUNTER — Ambulatory Visit: Payer: 59 | Admitting: Physical Therapy

## 2018-01-24 DIAGNOSIS — M6281 Muscle weakness (generalized): Secondary | ICD-10-CM

## 2018-01-24 DIAGNOSIS — M25561 Pain in right knee: Secondary | ICD-10-CM

## 2018-01-24 DIAGNOSIS — M25661 Stiffness of right knee, not elsewhere classified: Secondary | ICD-10-CM | POA: Diagnosis not present

## 2018-01-24 DIAGNOSIS — R600 Localized edema: Secondary | ICD-10-CM

## 2018-01-24 NOTE — Therapy (Signed)
[facility] [place], [place], [address] Phone: [phone]   Fax:  [phone]  Physical Therapy Treatment  Patient Details  Name: Sarah Hicks MRN: [id number] Date of Birth: 11-21-1960 Referring Provider: Dr. Gaynelle Arabian   Encounter Date: 01/24/2018  PT End of Session - 01/24/18 1106    Visit Number  35    Date for PT Re-Evaluation  02/04/18    PT Start Time  1033    PT Stop Time  1128    PT Time Calculation (min)  55 min    Activity Tolerance  Patient tolerated treatment well    Behavior During Therapy  [facility] for tasks assessed/performed       Past Medical History:  Diagnosis Date  . Arthritis    oa  . Complication of anesthesia    told to breathe twice after a morton's neuroma surgery 2003, did fine with other surgeries since  . GERD (gastroesophageal reflux disease)   . Headache   . History of kidney stones    passed on own  . Hypertension   . Medical history non-contributory   . Scoliosis   . Vitamin D deficiency 03/25/2016    Past Surgical History:  Procedure Laterality Date  . ANKLE SURGERY Right 10 year ago  . cervical cryoablation  1998  . CHOLECYSTECTOMY  17 years ago  . COLONOSCOPY N/A 09/13/2013   Procedure: COLONOSCOPY;  Surgeon: Rogene Houston, MD;  [facility];  Service: Endoscopy;  Laterality: N/A;  730-moved to 26 Ann to notify pt  . FOOT SURGERY     Morton's neuroma  . KNEE SURGERY Left 10 years ago   torn ligemant and tendon repair  . TOTAL KNEE ARTHROPLASTY Right 10/25/2017   Procedure: RIGHT TOTAL KNEE ARTHROPLASTY;  Surgeon: Gaynelle Arabian, MD;  Location: [facility];  Service: Orthopedics;  Laterality: Right;  Adductor Block  . TUBAL LIGATION  19 years ago    There were no vitals filed for this visit.  Subjective Assessment - 01/24/18 1036    Subjective  Patient arrived doing good today ongoing stiffness    Pertinent History  R ankle surgery, L knee surgery    Patient Stated Goals  to get back to walking without pain    Currently in Pain?  No/denies         [facility] PT Assessment - 01/24/18 0001      AROM   AROM Assessment Site  Knee    Right/Left Knee  Right    Right Knee Flexion  100      PROM   PROM Assessment Site  Knee    Right/Left Knee  Right    Right Knee Extension  0    Right Knee Flexion  11                   [facility] Adult PT Treatment/Exercise - 01/24/18 0001      Knee/Hip Exercises: Aerobic   Recumbent Bike  10 min L3      Knee/Hip Exercises: Machines for Strengthening   Cybex Leg Press  2plts x20      Knee/Hip Exercises: Standing   Forward Step Up  Right;20 reps;Hand Hold: 0;10 reps;Step Height: 8"    Step Down  Right;20 reps;Hand Hold: 1;Step Height: 8"      Vasopneumatic   Number Minutes Vasopneumatic   15 minutes    Vasopnuematic Location   Knee    Vasopneumatic Pressure  Low  Manual Therapy   Manual Therapy  Passive ROM    Joint Mobilization  patella mobs all directions    Passive ROM  PROM of R knee into flexion with low load holds               PT Short Term Goals - 11/29/17 1125      PT SHORT TERM GOAL #1   Title  I with HEP    Time  4    Period  Weeks    Status  Achieved 11/29/17      PT SHORT TERM GOAL #2   Title  Improved R knee ROM -5 to 100 degrees    Time  4    Period  Weeks    Status  Achieved 11/29/17      PT SHORT TERM GOAL #3   Title  Patient to report decreased pain by 50% with ambulation    Time  4    Period  Weeks    Status  Achieved 11/29/17      PT SHORT TERM GOAL #4   Title  Patient able to perform bed mobility independently    Time  4    Period  Weeks    Status  Achieved        PT Long Term Goals - 01/24/18 1109      PT LONG TERM GOAL #1   Title  I with advanced HEP    Time  8    Period  Weeks    Status  Achieved      PT LONG TERM GOAL #2   Title  Patient able to ambulate community distances safely without AD.    Time  8    Period  Weeks     Status  Achieved      PT LONG TERM GOAL #3   Title  Patient to demo improved R knee ROM 0-120 deg to normalize gait.    Time  8    Period  Weeks    Status  Partially Met      PT LONG TERM GOAL #4   Title  Patient to demo 4+/5 RLE hip and knee strength to improve function.    Time  8    Period  Weeks    Status  Partially Met      PT LONG TERM GOAL #5   Title  Patient to perform ADLs with 2/10 pain or less in R knee.    Time  8    Period  Weeks    Status  Achieved            Plan - 01/24/18 1110    Clinical Impression Statement  Patient tolerated treatment well today. Patient progressing with right LE strengthening exercises with good quad control. Patient progressing with ROM yet limited by edema in knee. Patient has no pain and able to complete ADL's with no reported discomfort. Patient met LTG #5 today others ongoing. F/U MD appt thursday.     Rehab Potential  Excellent    PT Frequency  3x / week    PT Duration  4 weeks    PT Treatment/Interventions  ADLs/Self Care Home Management;Cryotherapy;Occupational psychologist;Therapeutic exercise;Manual techniques;Patient/family education;Neuromuscular re-education;Passive range of motion;Taping;Vasopneumatic Device    PT Next Visit Plan  Cont with POC for ROM, strengthening and modalities PRN MD note next visit    Consulted and Agree with Plan of Care  Patient       Patient will benefit  from skilled therapeutic intervention in order to improve the following deficits and impairments:  Abnormal gait, Increased edema, Pain, Decreased range of motion, Impaired flexibility, Decreased strength  Visit Diagnosis: Stiffness of right knee, not elsewhere classified  Localized edema  Muscle weakness (generalized)  Acute pain of right knee     Problem List Patient Active Problem List   Diagnosis Date Noted  . OA (osteoarthritis) of knee 10/25/2017  . Well woman exam with routine gynecological exam  06/16/2017  . Vitamin D deficiency 03/25/2016    Phillips Climes, PTA 01/24/2018, 11:28 AM  [facility] [place], [place], [address] Phone: [phone]   Fax:  [phone]  Name: Sarah Hicks MRN: [id number] Date of Birth: Nov 05, 1960

## 2018-01-27 ENCOUNTER — Ambulatory Visit: Payer: 59 | Admitting: Physical Therapy

## 2018-01-27 ENCOUNTER — Encounter: Payer: Self-pay | Admitting: Physical Therapy

## 2018-01-27 DIAGNOSIS — M25661 Stiffness of right knee, not elsewhere classified: Secondary | ICD-10-CM

## 2018-01-27 DIAGNOSIS — M25561 Pain in right knee: Secondary | ICD-10-CM

## 2018-01-27 DIAGNOSIS — M6281 Muscle weakness (generalized): Secondary | ICD-10-CM

## 2018-01-27 DIAGNOSIS — R600 Localized edema: Secondary | ICD-10-CM

## 2018-01-27 NOTE — Therapy (Signed)
[facility] [place], [place], [address] Phone: [phone]   Fax:  [phone]  Physical Therapy Treatment  Patient Details  Name: Sarah Hicks MRN: [id number] Date of Birth: 02-19-1961 Referring Provider: Dr. Gaynelle Arabian   Encounter Date: 01/27/2018  PT End of Session - 01/27/18 1058    Visit Number  36    Number of Visits  40    Date for PT Re-Evaluation  02/04/18    PT Start Time  1031    PT Stop Time  1116    PT Time Calculation (min)  45 min    Activity Tolerance  Patient tolerated treatment well    Behavior During Therapy  [facility] for tasks assessed/performed       Past Medical History:  Diagnosis Date  . Arthritis    oa  . Complication of anesthesia    told to breathe twice after a morton's neuroma surgery 2003, did fine with other surgeries since  . GERD (gastroesophageal reflux disease)   . Headache   . History of kidney stones    passed on own  . Hypertension   . Medical history non-contributory   . Scoliosis   . Vitamin D deficiency 03/25/2016    Past Surgical History:  Procedure Laterality Date  . ANKLE SURGERY Right 10 year ago  . cervical cryoablation  1998  . CHOLECYSTECTOMY  17 years ago  . COLONOSCOPY N/A 09/13/2013   Procedure: COLONOSCOPY;  Surgeon: Rogene Houston, MD;  [facility]: [facility];  Service: Endoscopy;  Laterality: N/A;  730-moved to 25 Ann to notify pt  . FOOT SURGERY     Morton's neuroma  . KNEE SURGERY Left 10 years ago   torn ligemant and tendon repair  . TOTAL KNEE ARTHROPLASTY Right 10/25/2017   Procedure: RIGHT TOTAL KNEE ARTHROPLASTY;  Surgeon: Gaynelle Arabian, MD;  Location: [facility];  Service: Orthopedics;  Laterality: Right;  Adductor Block  . TUBAL LIGATION  19 years ago    There were no vitals filed for this visit.  Subjective Assessment - 01/27/18 1037    Subjective  Patient arrived doing good today ongoing stiffness    Pertinent History  R ankle surgery,  L knee surgery    Patient Stated Goals  to get back to walking without pain    Currently in Pain?  No/denies         [facility] PT Assessment - 01/27/18 0001      ROM / Strength   AROM / PROM / Strength  AROM;PROM;Strength      AROM   AROM Assessment Site  Knee    Right/Left Knee  Right    Right Knee Extension  0    Right Knee Flexion  102      PROM   PROM Assessment Site  Knee    Right/Left Knee  Right    Right Knee Extension  0    Right Knee Flexion  114      Strength   Strength Assessment Site  Hip;Knee    Right/Left Hip  Right    Right Hip ABduction  4+/5    Right/Left Knee  Right    Right Knee Flexion  4+/5    Right Knee Extension  5/5                   [facility] Adult PT Treatment/Exercise - 01/27/18 0001      Knee/Hip Exercises: Aerobic   Nustep  Level 5 x10  mins       Knee/Hip Exercises: Machines for Strengthening   Cybex Leg Press  2plts x20      Knee/Hip Exercises: Standing   Forward Step Up  Right;20 reps;Hand Hold: 0;10 reps;Step Height: 8"    Step Down  Right;20 reps;Hand Hold: 1;Step Height: 8"    Rocker Board  3 minutes      Vasopneumatic   Number Minutes Vasopneumatic   15 minutes    Vasopnuematic Location   Knee    Vasopneumatic Pressure  Low      Manual Therapy   Manual Therapy  Passive ROM    Joint Mobilization  patella mobs all directions    Passive ROM  PROM of R knee into flexion with low load holds               PT Short Term Goals - 11/29/17 1125      PT SHORT TERM GOAL #1   Title  I with HEP    Time  4    Period  Weeks    Status  Achieved 11/29/17      PT SHORT TERM GOAL #2   Title  Improved R knee ROM -5 to 100 degrees    Time  4    Period  Weeks    Status  Achieved 11/29/17      PT SHORT TERM GOAL #3   Title  Patient to report decreased pain by 50% with ambulation    Time  4    Period  Weeks    Status  Achieved 11/29/17      PT SHORT TERM GOAL #4   Title  Patient able to perform bed mobility  independently    Time  4    Period  Weeks    Status  Achieved        PT Long Term Goals - 01/27/18 1106      PT LONG TERM GOAL #1   Title  I with advanced HEP    Time  8    Period  Weeks    Status  Achieved      PT LONG TERM GOAL #2   Title  Patient able to ambulate community distances safely without AD.    Time  8    Period  Weeks    Status  Achieved      PT LONG TERM GOAL #3   Title  Patient to demo improved R knee ROM 0-120 deg to normalize gait.    Period  Weeks    Status  Partially Met 0-102 AROM 01/27/18      PT LONG TERM GOAL #4   Title  Patient to demo 4+/5 RLE hip and knee strength to improve function.    Time  8    Status  Achieved 01/27/18      PT LONG TERM GOAL #5   Title  Patient to perform ADLs with 2/10 pain or less in R knee.    Time  8    Period  Weeks    Status  Achieved            Plan - 01/27/18 1122    Clinical Impression Statement  Patient continues to improve and tolerate treatments well. Patient has improved right knee and hip strength today. Patient has improved with active and passive ROM for right knee flexion. Patient has been doing HEP daily as instucted per PT. Patient met all goals except ROM. MD F/U this afternoon and will  proceed per his discression.     Rehab Potential  Excellent    PT Frequency  3x / week    PT Duration  4 weeks    PT Treatment/Interventions  ADLs/Self Care Home Management;Cryotherapy;Occupational psychologist;Therapeutic exercise;Manual techniques;Patient/family education;Neuromuscular re-education;Passive range of motion;Taping;Vasopneumatic Device    PT Next Visit Plan  Cont with POC per MD    Consulted and Agree with Plan of Care  Patient       Patient will benefit from skilled therapeutic intervention in order to improve the following deficits and impairments:  Abnormal gait, Increased edema, Pain, Decreased range of motion, Impaired flexibility, Decreased strength  Visit  Diagnosis: Stiffness of right knee, not elsewhere classified  Localized edema  Muscle weakness (generalized)  Acute pain of right knee     Problem List Patient Active Problem List   Diagnosis Date Noted  . OA (osteoarthritis) of knee 10/25/2017  . Well woman exam with routine gynecological exam 06/16/2017  . Vitamin D deficiency 03/25/2016   Ladean Raya, PTA 01/27/18 11:29 AM  [facility] [place], [place], [address] Phone: [phone]   Fax:  [phone]  Name: Sarah Hicks MRN: [id number] Date of Birth: [birth year]

## 2018-01-31 ENCOUNTER — Encounter: Payer: Self-pay | Admitting: Physical Therapy

## 2018-01-31 ENCOUNTER — Ambulatory Visit: Payer: 59 | Admitting: Physical Therapy

## 2018-01-31 DIAGNOSIS — R600 Localized edema: Secondary | ICD-10-CM

## 2018-01-31 DIAGNOSIS — M25561 Pain in right knee: Secondary | ICD-10-CM

## 2018-01-31 DIAGNOSIS — M6281 Muscle weakness (generalized): Secondary | ICD-10-CM

## 2018-01-31 DIAGNOSIS — M25661 Stiffness of right knee, not elsewhere classified: Secondary | ICD-10-CM | POA: Diagnosis not present

## 2018-01-31 NOTE — Therapy (Signed)
[facility] [place], [place], [address] Phone: [phone]   Fax:  [phone]  Physical Therapy Treatment  Patient Details  Name: Sarah Hicks MRN: [id number] Date of Birth: [birth year] Referring Provider: Dr. Gaynelle Arabian   Encounter Date: 01/31/2018  PT End of Session - 01/31/18 1056    Visit Number  37    Number of Visits  40    Date for PT Re-Evaluation  02/04/18    PT Start Time  1036    PT Stop Time  1131    PT Time Calculation (min)  55 min    Activity Tolerance  Patient tolerated treatment well    Behavior During Therapy  [facility] for tasks assessed/performed       Past Medical History:  Diagnosis Date  . Arthritis    oa  . Complication of anesthesia    told to breathe twice after a morton's neuroma surgery 2003, did fine with other surgeries since  . GERD (gastroesophageal reflux disease)   . Headache   . History of kidney stones    passed on own  . Hypertension   . Medical history non-contributory   . Scoliosis   . Vitamin D deficiency 03/25/2016    Past Surgical History:  Procedure Laterality Date  . ANKLE SURGERY Right 10 year ago  . cervical cryoablation  1998  . CHOLECYSTECTOMY  17 years ago  . COLONOSCOPY N/A 09/13/2013   Procedure: COLONOSCOPY;  Surgeon: Rogene Houston, MD;  [facility];  Service: Endoscopy;  Laterality: N/A;  730-moved to [address] Ann to notify pt  . FOOT SURGERY     Morton's neuroma  . KNEE SURGERY Left 10 years ago   torn ligemant and tendon repair  . TOTAL KNEE ARTHROPLASTY Right 10/25/2017   Procedure: RIGHT TOTAL KNEE ARTHROPLASTY;  Surgeon: Gaynelle Arabian, MD;  [facility];  Service: Orthopedics;  Laterality: Right;  Adductor Block  . TUBAL LIGATION  19 years ago    There were no vitals filed for this visit.  Subjective Assessment - 01/31/18 1039    Subjective  Patient went to MD and is doing great and to finish up therapy then DC, some tightness in  right lateral knee today    Pertinent History  R ankle surgery, L knee surgery    Patient Stated Goals  to get back to walking without pain    Currently in Pain?  No/denies                       [facility] Adult PT Treatment/Exercise - 01/31/18 0001      Knee/Hip Exercises: Aerobic   Elliptical  L1x75mn    Nustep  Level 5 x10 mins       Knee/Hip Exercises: Machines for Strengthening   Cybex Leg Press  2plts x20      Knee/Hip Exercises: Standing   Forward Step Up  Right;20 reps;Hand Hold: 0;10 reps;Step Height: 8"    Step Down  Right;20 reps;Hand Hold: 1;Step Height: 8"    Rocker Board  3 minutes      Electrical Stimulation   Electrical Stimulation Location  rt lat knee    Electrical Stimulation Action  IFC    Electrical Stimulation Parameters  80-150hz x125m    Electrical Stimulation Goals  Other (comment)      Vasopneumatic   Number Minutes Vasopneumatic   15 minutes    Vasopnuematic Location   Knee  Vasopneumatic Pressure  Low      Manual Therapy   Manual Therapy  Passive ROM    Joint Mobilization  patella mobs all directions    Passive ROM  PROM of R knee into flexion with low load holds               PT Short Term Goals - 11/29/17 1125      PT SHORT TERM GOAL #1   Title  I with HEP    Time  4    Period  Weeks    Status  Achieved 11/29/17      PT SHORT TERM GOAL #2   Title  Improved R knee ROM -5 to 100 degrees    Time  4    Period  Weeks    Status  Achieved 11/29/17      PT SHORT TERM GOAL #3   Title  Patient to report decreased pain by 50% with ambulation    Time  4    Period  Weeks    Status  Achieved 11/29/17      PT SHORT TERM GOAL #4   Title  Patient able to perform bed mobility independently    Time  4    Period  Weeks    Status  Achieved        PT Long Term Goals - 01/27/18 1106      PT LONG TERM GOAL #1   Title  I with advanced HEP    Time  8    Period  Weeks    Status  Achieved      PT LONG TERM GOAL #2    Title  Patient able to ambulate community distances safely without AD.    Time  8    Period  Weeks    Status  Achieved      PT LONG TERM GOAL #3   Title  Patient to demo improved R knee ROM 0-120 deg to normalize gait.    Period  Weeks    Status  Partially Met 0-102 AROM 01/27/18      PT LONG TERM GOAL #4   Title  Patient to demo 4+/5 RLE hip and knee strength to improve function.    Time  8    Status  Achieved 01/27/18      PT LONG TERM GOAL #5   Title  Patient to perform ADLs with 2/10 pain or less in R knee.    Time  8    Period  Weeks    Status  Achieved            Plan - 01/31/18 1057    Clinical Impression Statement  Patient tolerated treatment well today. Patient progressing with all activities. Patient has some ROM limitations due to swelling. Patient went to MD and is to finish visits then DC. Patient is independent with HEP and all ADL's.     Rehab Potential  Excellent    PT Frequency  3x / week    PT Duration  4 weeks    PT Treatment/Interventions  ADLs/Self Care Home Management;Cryotherapy;Occupational psychologist;Therapeutic exercise;Manual techniques;Patient/family education;Neuromuscular re-education;Passive range of motion;Taping;Vasopneumatic Device    PT Next Visit Plan  Cont with POC and DC after next week    Consulted and Agree with Plan of Care  Patient       Patient will benefit from skilled therapeutic intervention in order to improve the following deficits and impairments:  Abnormal gait, Increased edema,  Pain, Decreased range of motion, Impaired flexibility, Decreased strength  Visit Diagnosis: Stiffness of right knee, not elsewhere classified  Localized edema  Muscle weakness (generalized)  Acute pain of right knee     Problem List Patient Active Problem List   Diagnosis Date Noted  . OA (osteoarthritis) of knee 10/25/2017  . Well woman exam with routine gynecological exam 06/16/2017  . Vitamin D deficiency  03/25/2016    Phillips Climes, PTA 01/31/2018, 11:56 AM  [facility] [place], [place], [address] Phone: [phone]   Fax:  [phone]  Name: Sarah Hicks MRN: [id number] Date of Birth: Oct 29, 1960

## 2018-02-03 ENCOUNTER — Encounter: Payer: Self-pay | Admitting: Physical Therapy

## 2018-02-03 ENCOUNTER — Ambulatory Visit: Payer: 59 | Admitting: Physical Therapy

## 2018-02-03 DIAGNOSIS — M6281 Muscle weakness (generalized): Secondary | ICD-10-CM

## 2018-02-03 DIAGNOSIS — M25561 Pain in right knee: Secondary | ICD-10-CM

## 2018-02-03 DIAGNOSIS — M25661 Stiffness of right knee, not elsewhere classified: Secondary | ICD-10-CM | POA: Diagnosis not present

## 2018-02-03 DIAGNOSIS — R600 Localized edema: Secondary | ICD-10-CM

## 2018-02-03 NOTE — Therapy (Signed)
[facility] [address] [place], , [address] Phone: [phone]   Fax:  [phone]  Physical Therapy Treatment  Patient Details  Name: Sarah Hicks MRN: [id number] Date of Birth: 05/25/1961 Referring Provider: Dr. Frank Aluisio   Encounter Date: 02/03/2018  PT End of Session - 02/03/18 1058    Visit Number  38    Number of Visits  40    Date for PT Re-Evaluation  02/04/18    PT Start Time  1035    PT Stop Time  1119    PT Time Calculation (min)  44 min    Activity Tolerance  Patient tolerated treatment well    Behavior During Therapy  [facility] for tasks assessed/performed       Past Medical History:  Diagnosis Date  . Arthritis    oa  . Complication of anesthesia    told to breathe twice after a morton's neuroma surgery 2003, did fine with other surgeries since  . GERD (gastroesophageal reflux disease)   . Headache   . History of kidney stones    passed on own  . Hypertension   . Medical history non-contributory   . Scoliosis   . Vitamin D deficiency 03/25/2016    Past Surgical History:  Procedure Laterality Date  . ANKLE SURGERY Right 10 year ago  . cervical cryoablation  1998  . CHOLECYSTECTOMY  17 years ago  . COLONOSCOPY N/A 09/13/2013   Procedure: COLONOSCOPY;  Surgeon: Najeeb U Rehman, MD;  [facility];  Service: Endoscopy;  Laterality: N/A;  730-moved to [address] to notify pt  . FOOT SURGERY     Morton's neuroma  . KNEE SURGERY Left 10 years ago   torn ligemant and tendon repair  . TOTAL KNEE ARTHROPLASTY Right 10/25/2017   Procedure: RIGHT TOTAL KNEE ARTHROPLASTY;  Surgeon: Aluisio, Frank, MD;  [facility];  Service: Orthopedics;  Laterality: Right;  Adductor Block  . TUBAL LIGATION  19 years ago    There were no vitals filed for this visit.  Subjective Assessment - 02/03/18 1041    Subjective  Patient tolerated last treatment well with some ongoing tightness in knee    Pertinent  History  R ankle surgery, L knee surgery    Patient Stated Goals  to get back to walking without pain    Currently in Pain?  No/denies         [facility] PT Assessment - 02/03/18 0001      AROM   Right Knee Flexion  105      PROM   Right Knee Flexion  115                   [facility] Adult PT Treatment/Exercise - 02/03/18 0001      Knee/Hip Exercises: Aerobic   Nustep  Level 5 x10 mins       Knee/Hip Exercises: Machines for Strengthening   Cybex Leg Press  2plts x20      Knee/Hip Exercises: Standing   Rocker Board  3 minutes      Knee/Hip Exercises: Supine   Bridges with Ball Squeeze  Strengthening;Both;20 reps    Other Supine Knee/Hip Exercises  Rt LE hip abd red t-band x30      Electrical Stimulation   Electrical Stimulation Location  rt lat knee    Electrical Stimulation Action  IFC    Electrical Stimulation Parameters  80-150hz x15min    Electrical Stimulation Goals  Other (comment) per   requested      Vasopneumatic   Number Minutes Vasopneumatic   15 minutes    Vasopnuematic Location   Knee    Vasopneumatic Pressure  Low      Manual Therapy   Manual Therapy  Passive ROM    Joint Mobilization  patella mobs all directions    Passive ROM  PROM of R knee into flexion with low load holds               PT Short Term Goals - 11/29/17 1125      PT SHORT TERM GOAL #1   Title  I with HEP    Time  4    Period  Weeks    Status  Achieved 11/29/17      PT SHORT TERM GOAL #2   Title  Improved R knee ROM -5 to 100 degrees    Time  4    Period  Weeks    Status  Achieved 11/29/17      PT SHORT TERM GOAL #3   Title  Patient to report decreased pain by 50% with ambulation    Time  4    Period  Weeks    Status  Achieved 11/29/17      PT SHORT TERM GOAL #4   Title  Patient able to perform bed mobility independently    Time  4    Period  Weeks    Status  Achieved        PT Long Term Goals - 01/27/18 1106      PT LONG TERM GOAL #1   Title  I with  advanced HEP    Time  8    Period  Weeks    Status  Achieved      PT LONG TERM GOAL #2   Title  Patient able to ambulate community distances safely without AD.    Time  8    Period  Weeks    Status  Achieved      PT LONG TERM GOAL #3   Title  Patient to demo improved R knee ROM 0-120 deg to normalize gait.    Period  Weeks    Status  Partially Met 0-102 AROM 01/27/18      PT LONG TERM GOAL #4   Title  Patient to demo 4+/5 RLE hip and knee strength to improve function.    Time  8    Status  Achieved 01/27/18      PT LONG TERM GOAL #5   Title  Patient to perform ADLs with 2/10 pain or less in R knee.    Time  8    Period  Weeks    Status  Achieved            Plan - 02/03/18 1107    Clinical Impression Statement  Patient tolerated treatment well today. Patient progressing with right knee strengthening and stabilization exercises. Patient doing well with ROM yet ongoing limitations with right knee flexion due to pain with ROM and swelling. Patient close to meeting all goals.     Rehab Potential  Excellent    PT Frequency  3x / week    PT Duration  4 weeks    PT Treatment/Interventions  ADLs/Self Care Home Management;Cryotherapy;Electrical Stimulation;Gait training;Stair training;Therapeutic exercise;Manual techniques;Patient/family education;Neuromuscular re-education;Passive range of motion;Taping;Vasopneumatic Device    PT Next Visit Plan  Cont with POC and DC after next week    Consulted and Agree with Plan of Care    Patient       Patient will benefit from skilled therapeutic intervention in order to improve the following deficits and impairments:  Abnormal gait, Increased edema, Pain, Decreased range of motion, Impaired flexibility, Decreased strength  Visit Diagnosis: Stiffness of right knee, not elsewhere classified  Localized edema  Muscle weakness (generalized)  Acute pain of right knee     Problem List Patient Active Problem List   Diagnosis Date Noted   . OA (osteoarthritis) of knee 10/25/2017  . Well woman exam with routine gynecological exam 06/16/2017  . Vitamin D deficiency 03/25/2016    DUNFORD, CHRISTINA P, PTA 02/03/2018, 11:20 AM  [facility] [address] [place], [place], [address] Phone: [phone]   Fax:  [phone]  Name: Sarah Hicks MRN: [id number] Date of Birth: 10/12/1960   

## 2018-02-07 ENCOUNTER — Encounter: Payer: 59 | Admitting: Physical Therapy

## 2018-02-10 ENCOUNTER — Ambulatory Visit: Payer: 59 | Attending: Orthopedic Surgery | Admitting: Physical Therapy

## 2018-02-10 ENCOUNTER — Encounter: Payer: Self-pay | Admitting: Physical Therapy

## 2018-02-10 DIAGNOSIS — M6281 Muscle weakness (generalized): Secondary | ICD-10-CM | POA: Diagnosis present

## 2018-02-10 DIAGNOSIS — R600 Localized edema: Secondary | ICD-10-CM | POA: Diagnosis present

## 2018-02-10 DIAGNOSIS — M25561 Pain in right knee: Secondary | ICD-10-CM

## 2018-02-10 DIAGNOSIS — M25661 Stiffness of right knee, not elsewhere classified: Secondary | ICD-10-CM | POA: Diagnosis not present

## 2018-02-10 NOTE — Therapy (Signed)
[facility] [place], [place], [address] Phone: [phone]   Fax:  [phone]  Physical Therapy Treatment  Patient Details  Name: TAETUM FLEWELLEN MRN: [id number] Date of Birth: [birth year] Referring Provider: Dr. Gaynelle Arabian   Encounter Date: 02/10/2018  PT End of Session - 02/10/18 1106    Visit Number  39    Number of Visits  40    Date for PT Re-Evaluation  02/04/18    PT Start Time  6384    PT Stop Time  1126    PT Time Calculation (min)  51 min    Activity Tolerance  Patient tolerated treatment well    Behavior During Therapy  [facility] for tasks assessed/performed       Past Medical History:  Diagnosis Date  . Arthritis    oa  . Complication of anesthesia    told to breathe twice after a morton's neuroma surgery 2003, did fine with other surgeries since  . GERD (gastroesophageal reflux disease)   . Headache   . History of kidney stones    passed on own  . Hypertension   . Medical history non-contributory   . Scoliosis   . Vitamin D deficiency 03/25/2016    Past Surgical History:  Procedure Laterality Date  . ANKLE SURGERY Right 10 year ago  . cervical cryoablation  1998  . CHOLECYSTECTOMY  17 years ago  . COLONOSCOPY N/A 09/13/2013   Procedure: COLONOSCOPY;  Surgeon: Rogene Houston, MD;  [facility];  Service: Endoscopy;  Laterality: N/A;  730-moved to [address] to notify pt  . FOOT SURGERY     Morton's neuroma  . KNEE SURGERY Left 10 years ago   torn ligemant and tendon repair  . TOTAL KNEE ARTHROPLASTY Right 10/25/2017   Procedure: RIGHT TOTAL KNEE ARTHROPLASTY;  Surgeon: Gaynelle Arabian, MD;  [facility];  Service: Orthopedics;  Laterality: Right;  Adductor Block  . TUBAL LIGATION  19 years ago    There were no vitals filed for this visit.  Subjective Assessment - 02/10/18 1042    Subjective  Patient arrived feeling better was sick     Pertinent History  R ankle surgery, L knee  surgery    Patient Stated Goals  to get back to walking without pain    Currently in Pain?  No/denies         [facility] PT Assessment - 02/10/18 0001      AROM   AROM Assessment Site  Knee    Right/Left Knee  Right    Right Knee Flexion  105      PROM   PROM Assessment Site  Knee    Right/Left Knee  Right    Right Knee Flexion  115                   [facility] Adult PT Treatment/Exercise - 02/10/18 0001      Knee/Hip Exercises: Aerobic   Nustep  Level 5 x10 mins       Knee/Hip Exercises: Machines for Strengthening   Cybex Knee Extension  10# x fatigue    Cybex Knee Flexion  30# xfatigue    Cybex Leg Press  2plts x20      Vasopneumatic   Number Minutes Vasopneumatic   15 minutes    Vasopnuematic Location   Knee    Vasopneumatic Pressure  Low      Manual Therapy   Manual Therapy  Passive ROM  Joint Mobilization  patella mobs all directions    Passive ROM  PROM of R knee into flexion with low load holds               PT Short Term Goals - 11/29/17 1125      PT SHORT TERM GOAL #1   Title  I with HEP    Time  4    Period  Weeks    Status  Achieved 11/29/17      PT SHORT TERM GOAL #2   Title  Improved R knee ROM -5 to 100 degrees    Time  4    Period  Weeks    Status  Achieved 11/29/17      PT SHORT TERM GOAL #3   Title  Patient to report decreased pain by 50% with ambulation    Time  4    Period  Weeks    Status  Achieved 11/29/17      PT SHORT TERM GOAL #4   Title  Patient able to perform bed mobility independently    Time  4    Period  Weeks    Status  Achieved        PT Long Term Goals - 01/27/18 1106      PT LONG TERM GOAL #1   Title  I with advanced HEP    Time  8    Period  Weeks    Status  Achieved      PT LONG TERM GOAL #2   Title  Patient able to ambulate community distances safely without AD.    Time  8    Period  Weeks    Status  Achieved      PT LONG TERM GOAL #3   Title  Patient to demo improved R knee ROM 0-120  deg to normalize gait.    Period  Weeks    Status  Partially Met 0-102 AROM 01/27/18      PT LONG TERM GOAL #4   Title  Patient to demo 4+/5 RLE hip and knee strength to improve function.    Time  8    Status  Achieved 01/27/18      PT LONG TERM GOAL #5   Title  Patient to perform ADLs with 2/10 pain or less in R knee.    Time  8    Period  Weeks    Status  Achieved            Plan - 02/10/18 1108    Clinical Impression Statement  Patient tolerated treatment well today. Patient has minimal to no difficulty with ADL's and has reported improvement overall. Patient has ongoing limitations with knee flexion and some edema in knee. patient to DC next treatment.    Rehab Potential  Excellent    PT Frequency  3x / week    PT Duration  4 weeks    PT Treatment/Interventions  ADLs/Self Care Home Management;Cryotherapy;Occupational psychologist;Therapeutic exercise;Manual techniques;Patient/family education;Neuromuscular re-education;Passive range of motion;Taping;Vasopneumatic Device    PT Next Visit Plan  Cont with POC and DC next visit    Consulted and Agree with Plan of Care  Patient       Patient will benefit from skilled therapeutic intervention in order to improve the following deficits and impairments:  Abnormal gait, Increased edema, Pain, Decreased range of motion, Impaired flexibility, Decreased strength  Visit Diagnosis: Stiffness of right knee, not elsewhere classified  Localized edema  Muscle weakness (  generalized)  Acute pain of right knee     Problem List Patient Active Problem List   Diagnosis Date Noted  . OA (osteoarthritis) of knee 10/25/2017  . Well woman exam with routine gynecological exam 06/16/2017  . Vitamin D deficiency 03/25/2016    Phillips Climes, PTA 02/10/2018, 11:39 AM  [facility] [place], [place], [address] Phone: [phone]   Fax:   [phone]  Name: CARROL HOUGLAND MRN: [id number] Date of Birth: [birth year]

## 2018-02-14 ENCOUNTER — Ambulatory Visit: Payer: 59 | Admitting: Physical Therapy

## 2018-02-14 DIAGNOSIS — M25661 Stiffness of right knee, not elsewhere classified: Secondary | ICD-10-CM

## 2018-02-14 DIAGNOSIS — R600 Localized edema: Secondary | ICD-10-CM

## 2018-02-14 DIAGNOSIS — M25561 Pain in right knee: Secondary | ICD-10-CM

## 2018-02-14 DIAGNOSIS — M6281 Muscle weakness (generalized): Secondary | ICD-10-CM

## 2018-02-14 NOTE — Therapy (Signed)
[facility] [place], [place], [address] Phone: [phone]   Fax:  [phone]  Physical Therapy Treatment  Patient Details  Name: Sarah Hicks MRN: [id number] Date of Birth: Aug 30, 1961 Referring Provider: Dr. Gaynelle Arabian   Encounter Date: 02/14/2018  PT End of Session - 02/14/18 1342    Visit Number  40    Number of Visits  40    Date for PT Re-Evaluation  02/04/18    PT Start Time  6010    PT Stop Time  1355    PT Time Calculation (min)  50 min    Activity Tolerance  Patient tolerated treatment well    Behavior During Therapy  [facility] for tasks assessed/performed       Past Medical History:  Diagnosis Date  . Arthritis    oa  . Complication of anesthesia    told to breathe twice after a morton's neuroma surgery 2003, did fine with other surgeries since  . GERD (gastroesophageal reflux disease)   . Headache   . History of kidney stones    passed on own  . Hypertension   . Medical history non-contributory   . Scoliosis   . Vitamin D deficiency 03/25/2016    Past Surgical History:  Procedure Laterality Date  . ANKLE SURGERY Right 10 year ago  . cervical cryoablation  1998  . CHOLECYSTECTOMY  17 years ago  . COLONOSCOPY N/A 09/13/2013   Procedure: COLONOSCOPY;  Surgeon: Rogene Houston, MD;  [facility];  Service: Endoscopy;  Laterality: N/A;  730-moved to [address] to notify pt  . FOOT SURGERY     Morton's neuroma  . KNEE SURGERY Left 10 years ago   torn ligemant and tendon repair  . TOTAL KNEE ARTHROPLASTY Right 10/25/2017   Procedure: RIGHT TOTAL KNEE ARTHROPLASTY;  Surgeon: Gaynelle Arabian, MD;  [facility];  Service: Orthopedics;  Laterality: Right;  Adductor Block  . TUBAL LIGATION  19 years ago    There were no vitals filed for this visit.  Subjective Assessment - 02/14/18 1306    Subjective  Patient arrived feeling good with some tightness in knee    Pertinent History  R ankle  surgery, L knee surgery    Patient Stated Goals  to get back to walking without pain    Currently in Pain?  No/denies         [facility] PT Assessment - 02/14/18 0001      AROM   AROM Assessment Site  Knee    Right/Left Knee  Right    Right Knee Flexion  110      PROM   PROM Assessment Site  Knee    Right/Left Knee  Right    Right Knee Flexion  118                   [facility] Adult PT Treatment/Exercise - 02/14/18 0001      Knee/Hip Exercises: Aerobic   Stationary Bike  81mn L3    Elliptical  --      Knee/Hip Exercises: Machines for Strengthening   Cybex Knee Extension  10# x fatigue    Cybex Knee Flexion  30# xfatigue    Cybex Leg Press  2plts x20      Knee/Hip Exercises: Standing   Rocker Board  2 minutes      Electrical Stimulation   Electrical Stimulation Location  rt lat knee    Electrical Stimulation Action  IFC  80-_0  x13mn    Electrical Stimulation Goals  Other (comment) per patient request      Vasopneumatic   Number Minutes Vasopneumatic   15 minutes    Vasopnuematic Location   Knee    Vasopneumatic Pressure  Low      Manual Therapy   Manual Therapy  Passive ROM    Passive ROM  PROM of R knee into flexion with low load holds               PT Short Term Goals - 11/29/17 1125      PT SHORT TERM GOAL #1   Title  I with HEP    Time  4    Period  Weeks    Status  Achieved 11/29/17      PT SHORT TERM GOAL #2   Title  Improved R knee ROM -5 to 100 degrees    Time  4    Period  Weeks    Status  Achieved 11/29/17      PT SHORT TERM GOAL #3   Title  Patient to report decreased pain by 50% with ambulation    Time  4    Period  Weeks    Status  Achieved 11/29/17      PT SHORT TERM GOAL #4   Title  Patient able to perform bed mobility independently    Time  4    Period  Weeks    Status  Achieved        PT Long Term Goals - 02/14/18 1343      PT LONG TERM GOAL #1   Title  I with advanced HEP    Time  8    Period  Weeks     Status  Achieved      PT LONG TERM GOAL #2   Title  Patient able to ambulate community distances safely without AD.    Time  8    Period  Weeks    Status  Achieved      PT LONG TERM GOAL #3   Title  Patient to demo improved R knee ROM 0-120 deg to normalize gait.    Time  8    Period  Weeks    Status  Partially Met 0-110 02/14/18      PT LONG TERM GOAL #4   Title  Patient to demo 4+/5 RLE hip and knee strength to improve function.    Time  8    Period  Weeks    Status  Achieved      PT LONG TERM GOAL #5   Title  Patient to perform ADLs with 2/10 pain or less in R knee.    Time  8    Period  Weeks    Status  Achieved            Plan - 02/14/18 1343    Clinical Impression Statement  patient has met all goals except full flexion active ROM. Patient ready to DC to HEP    Rehab Potential  Excellent    PT Frequency  3x / week    PT Duration  4 weeks    PT Treatment/Interventions  ADLs/Self Care Home Management;Cryotherapy;EOccupational psychologistTherapeutic exercise;Manual techniques;Patient/family education;Neuromuscular re-education;Passive range of motion;Taping;Vasopneumatic Device    PT Next Visit Plan  DC    Consulted and Agree with Plan of Care  Patient       Patient will benefit from skilled therapeutic intervention in order to  improve the following deficits and impairments:  Abnormal gait, Increased edema, Pain, Decreased range of motion, Impaired flexibility, Decreased strength  Visit Diagnosis: Stiffness of right knee, not elsewhere classified  Localized edema  Muscle weakness (generalized)  Acute pain of right knee     Problem List Patient Active Problem List   Diagnosis Date Noted  . OA (osteoarthritis) of knee 10/25/2017  . Well woman exam with routine gynecological exam 06/16/2017  . Vitamin D deficiency 03/25/2016    Ladean Raya, PTA 02/14/18 1:59 PM  [facility] [place], [place], [address] Phone: [phone]   Fax:  [phone]  Name: Sarah Hicks MRN: [id number] Date of Birth: June 26, 1961  PHYSICAL THERAPY DISCHARGE SUMMARY  Visits from Start of Care: 61.  Current functional level related to goals / functional outcomes: See above.   Remaining deficits: All goals achieved.   Education / Equipment: HEP. Plan: Patient agrees to discharge.  Patient goals were partially met. Patient is being discharged due to meeting the stated rehab goals.  ?????        Mali Applegate MPT

## 2018-06-21 ENCOUNTER — Ambulatory Visit: Payer: 59 | Admitting: Physical Therapy

## 2018-06-23 ENCOUNTER — Ambulatory Visit: Payer: 59 | Attending: Sports Medicine | Admitting: Physical Therapy

## 2018-06-23 ENCOUNTER — Encounter: Payer: Self-pay | Admitting: Physical Therapy

## 2018-06-23 ENCOUNTER — Other Ambulatory Visit: Payer: Self-pay

## 2018-06-23 DIAGNOSIS — M25672 Stiffness of left ankle, not elsewhere classified: Secondary | ICD-10-CM | POA: Insufficient documentation

## 2018-06-23 DIAGNOSIS — M79672 Pain in left foot: Secondary | ICD-10-CM | POA: Diagnosis not present

## 2018-06-23 NOTE — Therapy (Signed)
[facility] [place], [place], [address] Phone: [phone]   Fax:  [phone]  Physical Therapy Evaluation  Patient Details  Name: Sarah Hicks MRN: [id number] Date of Birth: [birth year] Referring Provider: Vickki Hearing MD   Encounter Date: 06/23/2018  PT End of Session - 06/23/18 1610    Visit Number  1    Number of Visits  12    Date for PT Re-Evaluation  08/04/18    Authorization Type  FOTO AT LEAST EVERY 5TH VISIT, 10TH VISIT PROGRESS NOTE.    PT Start Time  1123    PT Stop Time  1220    PT Time Calculation (min)  57 min    Activity Tolerance  Patient tolerated treatment well    Behavior During Therapy  WFL for tasks assessed/performed       Past Medical History:  Diagnosis Date  . Arthritis    oa  . Complication of anesthesia    told to breathe twice after a morton's neuroma surgery 2003, did fine with other surgeries since  . GERD (gastroesophageal reflux disease)   . Headache   . History of kidney stones    passed on own  . Hypertension   . Medical history non-contributory   . Scoliosis   . Vitamin D deficiency 03/25/2016    Past Surgical History:  Procedure Laterality Date  . ANKLE SURGERY Right 10 year ago  . cervical cryoablation  1998  . CHOLECYSTECTOMY  17 years ago  . COLONOSCOPY N/A 09/13/2013   Procedure: COLONOSCOPY;  Surgeon: Rogene Houston, MD;  [facility];  Service: Endoscopy;  Laterality: N/A;  730-moved to 37 Ann to notify pt  . FOOT SURGERY     Morton's neuroma  . KNEE SURGERY Left 10 years ago   torn ligemant and tendon repair  . TOTAL KNEE ARTHROPLASTY Right 10/25/2017   Procedure: RIGHT TOTAL KNEE ARTHROPLASTY;  Surgeon: Gaynelle Arabian, MD;  [facility]: [facility];  Service: Orthopedics;  Laterality: Right;  Adductor Block  . TUBAL LIGATION  19 years ago    There were no vitals filed for this visit.   Subjective Assessment - 06/23/18 1617    Subjective  The  patient has had ongoing left foot pain since 2017 after hitting back of foot on a banister.  Her pain is a low 2/10 but rises to an 8/10 when she stands for short periods of time.  Her pain is very intense when she first stands in the morning.  Resting leg on pillows decreases her pain.      Pertinent History  Right total knee replacement.    Limitations  Standing    How long can you stand comfortably?  5 minutes.    Patient Stated Goals  Walk without pain.    Currently in Pain?  Yes    Pain Score  2     Pain Location  Foot    Pain Orientation  Left    Pain Descriptors / Indicators  Aching;Sharp    Pain Type  Chronic pain    Pain Onset  More than a month ago    Pain Frequency  Constant    Aggravating Factors   See above.    Pain Relieving Factors  See above.         [facility] PT Assessment - 06/23/18 0001      Assessment   Medical Diagnosis  Left plantar fasciitis and ankle tendinitis.    Referring Provider  Vickki Hearing MD    Onset Date/Surgical Date  --   2017.     Precautions   Precautions  None      Restrictions   Weight Bearing Restrictions  No      Balance Screen   Has the patient fallen in the past 6 months  No    Has the patient had a decrease in activity level because of a fear of falling?   No    Is the patient reluctant to leave their home because of a fear of falling?   No      Home Film/video editor residence      Prior Function   Level of Independence  Independent      ROM / Strength   AROM / PROM / Strength  AROM;Strength      AROM   Overall AROM Comments  Left ankle dorsiflexion is 5 degrees with knee in full extension and 10 degrees with knee flexed.      Strength   Overall Strength Comments  Normal left ankle strength.      Palpation   Palpation comment  Tender to palpation in region of left calcaneal tubercle and along medial arch.  Patient is very tender to palpation over left Achilles tendon but especially at the  musculo-tendinosis junction.  She has palpable trigger points in her left Gastroc-Soleus complex.      Ambulation/Gait   Gait Comments  Patient gaiting with a tendency for bilateral genu varum.                Objective measurements completed on examination: See above findings.      [facility] Adult PT Treatment/Exercise - 06/23/18 0001      Modalities   Modalities  Electrical Stimulation;Vasopneumatic      Electrical Stimulation   Electrical Stimulation Location  Left heel/medial arch and left Achilles.    Electrical Stimulation Action  Pre-mod channels (4 electrodes).    Electrical Stimulation Parameters  80-150 Hz x 20 minutes.    Electrical Stimulation Goals  Pain      Vasopneumatic   Number Minutes Vasopneumatic   20 minutes    Vasopnuematic Location   --   Left ankle/foot.   Vasopneumatic Pressure  Medium                  PT Long Term Goals - 06/23/18 1751      PT LONG TERM GOAL #1   Title  I with advanced HEP    Time  6    Period  Weeks    Status  New      PT LONG TERM GOAL #2   Title  Increase left ankle dorsiflexion to 10 degrees with knee in full extension to normalize the patient's gait pattern.    Time  6    Period  Weeks    Status  New      PT LONG TERM GOAL #3   Title  Stand 20 minutes with pain not > 2-3/10.    Time  6    Period  Weeks    Status  New      PT LONG TERM GOAL #4   Title  Pain level upon rising not > 2-3/10.    Time  6    Period  Weeks    Status  New             Plan - 06/23/18 1728    Clinical  Impression Statement  The patient presents to [facility] with c/o left foot and ankle pain that has been ongoing for 2 years.  She was found to be tender to palpation over her left [place] medial arch and Achilles tendon.  She is found to have limited dorsiflexion and trigger points in her left Gastroc-Soleus complex.  Patient will benefit from skilled physical therapy intervention to address deficits.    History and Personal  Factors relevant to plan of care:  Right TKA.    Clinical Presentation  Evolving    Clinical Presentation due to:  Not improving.    Clinical Decision Making  Low    Rehab Potential  Excellent    PT Frequency  2x / week    PT Duration  6 weeks    PT Treatment/Interventions  ADLs/Self Care Home Management;Cryotherapy;Electrical Stimulation;Iontophoresis 4mg /ml Dexamethasone;Therapeutic exercise;Therapeutic activities;Patient/family education;Passive range of motion;Manual techniques;Dry needling;Vasopneumatic Device    PT Next Visit Plan  Combo e'stim/U/S to left foot (plantar surface) and distal calf; IASTM; Gastroc-Soleus stretching; vasopneumatic and electrical stimulation.      Consulted and Agree with Plan of Care  Patient       Patient will benefit from skilled therapeutic intervention in order to improve the following deficits and impairments:  Pain, Decreased activity tolerance, Decreased range of motion  Visit Diagnosis: Pain in left foot - Plan: PT plan of care cert/re-cert  Stiffness of left ankle, not elsewhere classified - Plan: PT plan of care cert/re-cert     Problem List Patient Active Problem List   Diagnosis Date Noted  . OA (osteoarthritis) of knee 10/25/2017  . Well woman exam with routine gynecological exam 06/16/2017  . Vitamin D deficiency 03/25/2016    Wells Gerdeman, [place] MPT 06/23/2018, 5:56 PM  [facility] [address] [place], [place], [address] Phone: [phone]   Fax:  [phone]  Name: LIVY ROSS MRN: [id number] Date of Birth: 03-10-1961

## 2018-06-24 ENCOUNTER — Ambulatory Visit: Payer: 59 | Admitting: Physical Therapy

## 2018-06-24 ENCOUNTER — Encounter: Payer: Self-pay | Admitting: Physical Therapy

## 2018-06-24 DIAGNOSIS — M79672 Pain in left foot: Secondary | ICD-10-CM | POA: Diagnosis not present

## 2018-06-24 DIAGNOSIS — M25672 Stiffness of left ankle, not elsewhere classified: Secondary | ICD-10-CM

## 2018-06-24 NOTE — Therapy (Signed)
[facility] [place], [place], [address] Phone: [phone]   Fax:  [phone]  Physical Therapy Treatment  Patient Details  Name: Sarah Hicks MRN: [id number] Date of Birth: 1961/03/14 Referring Provider: Vickki Hearing MD   Encounter Date: 06/24/2018  PT End of Session - 06/24/18 1151    Visit Number  2    Number of Visits  12    Date for PT Re-Evaluation  08/04/18    Authorization Type  FOTO AT LEAST EVERY 5TH VISIT, 10TH VISIT PROGRESS NOTE.    PT Start Time  1032    PT Stop Time  1135    PT Time Calculation (min)  63 min    Activity Tolerance  Patient tolerated treatment well    Behavior During Therapy  WFL for tasks assessed/performed       Past Medical History:  Diagnosis Date  . Arthritis    oa  . Complication of anesthesia    told to breathe twice after a morton's neuroma surgery 2003, did fine with other surgeries since  . GERD (gastroesophageal reflux disease)   . Headache   . History of kidney stones    passed on own  . Hypertension   . Medical history non-contributory   . Scoliosis   . Vitamin D deficiency 03/25/2016    Past Surgical History:  Procedure Laterality Date  . ANKLE SURGERY Right 10 year ago  . cervical cryoablation  1998  . CHOLECYSTECTOMY  17 years ago  . COLONOSCOPY N/A 09/13/2013   Procedure: COLONOSCOPY;  Surgeon: Rogene Houston, MD;  [facility];  Service: Endoscopy;  Laterality: N/A;  730-moved to 37 Ann to notify pt  . FOOT SURGERY     Morton's neuroma  . KNEE SURGERY Left 10 years ago   torn ligemant and tendon repair  . TOTAL KNEE ARTHROPLASTY Right 10/25/2017   Procedure: RIGHT TOTAL KNEE ARTHROPLASTY;  Surgeon: Gaynelle Arabian, MD;  [facility];  Service: Orthopedics;  Laterality: Right;  Adductor Block  . TUBAL LIGATION  19 years ago    There were no vitals filed for this visit.  Subjective Assessment - 06/24/18 1138    Subjective  No new  complaints.                       [facility] Adult PT Treatment/Exercise - 06/24/18 1153      Modalities   Modalities  Electrical Stimulation;Ultrasound      Electrical Stimulation   Electrical Stimulation Location  Left heel/medial arch and left Achilles.    Electrical Stimulation Action  Pre-mod 2 channels.    Electrical Stimulation Parameters  80-150 Hz x 20 minutes.      Ultrasound   Ultrasound Location  --   Left affected Achilles/Left foor plantar surface.   Ultrasound Parameters  Combo e'stim/U/S at 1.50 W/CM2 x 12 minutes.    Ultrasound Goals  Pain      Vasopneumatic   Number Minutes Vasopneumatic   20 minutes    Vasopnuematic Location   --   Left foot/ankle.   Vasopneumatic Pressure  Medium      Manual Therapy   Manual Therapy  Soft tissue mobilization    Soft tissue mobilization  In prone:  STW/M to affected left Achilles and plantar surface of left foot x 12 minutes.                  PT Long Term Goals -  06/23/18 1751      PT LONG TERM GOAL #1   Title  I with advanced HEP    Time  6    Period  Weeks    Status  New      PT LONG TERM GOAL #2   Title  Increase left ankle dorsiflexion to 10 degrees with knee in full extension to normalize the patient's gait pattern.    Time  6    Period  Weeks    Status  New      PT LONG TERM GOAL #3   Title  Stand 20 minutes with pain not > 2-3/10.    Time  6    Period  Weeks    Status  New      PT LONG TERM GOAL #4   Title  Pain level upon rising not > 2-3/10.    Time  6    Period  Weeks    Status  New            Plan - 06/24/18 1156    Clinical Impression Statement  Patient did well with treatment today.  She was tender to palption over left mid-calf and Achilles and from heel all along her left medial arch.      PT Treatment/Interventions  ADLs/Self Care Home Management;Cryotherapy;Electrical Stimulation;Iontophoresis 4mg /ml Dexamethasone;Therapeutic exercise;Therapeutic  activities;Patient/family education;Passive range of motion;Manual techniques;Dry needling;Vasopneumatic Device    PT Next Visit Plan  Combo e'stim/U/S to left foot (plantar surface) and distal calf; IASTM; Gastroc-Soleus stretching; vasopneumatic and electrical stimulation.  Add taping.  Review standing left gastroc-soleus stretch.    Consulted and Agree with Plan of Care  Patient       Patient will benefit from skilled therapeutic intervention in order to improve the following deficits and impairments:  Pain, Decreased activity tolerance, Decreased range of motion  Visit Diagnosis: Pain in left foot  Stiffness of left ankle, not elsewhere classified     Problem List Patient Active Problem List   Diagnosis Date Noted  . OA (osteoarthritis) of knee 10/25/2017  . Well woman exam with routine gynecological exam 06/16/2017  . Vitamin D deficiency 03/25/2016    Sarah Hicks, [place] MPT 06/24/2018, 11:59 AM  [facility] [place], [place], [address] Phone: [phone]   Fax:  [phone]  Name: Sarah Hicks MRN: [id number] Date of Birth: 1961/06/26

## 2018-06-28 ENCOUNTER — Ambulatory Visit: Payer: 59 | Admitting: Physical Therapy

## 2018-06-28 ENCOUNTER — Encounter: Payer: Self-pay | Admitting: Physical Therapy

## 2018-06-28 DIAGNOSIS — M79672 Pain in left foot: Secondary | ICD-10-CM

## 2018-06-28 DIAGNOSIS — M25672 Stiffness of left ankle, not elsewhere classified: Secondary | ICD-10-CM

## 2018-06-28 NOTE — Therapy (Signed)
[facility] [place], [place], [address] Phone: [phone]   Fax:  [phone]  Physical Therapy Treatment  Patient Details  Name: Sarah Hicks MRN: [id number] Date of Birth: November 21, 1960 Referring Provider: Vickki Hearing MD   Encounter Date: 06/28/2018  PT End of Session - 06/28/18 1508    Visit Number  3    Number of Visits  12    Date for PT Re-Evaluation  08/04/18    Authorization Type  FOTO AT LEAST EVERY 5TH VISIT, 10TH VISIT PROGRESS NOTE.    PT Start Time  1300    PT Stop Time  1357    PT Time Calculation (min)  57 min    Activity Tolerance  Patient tolerated treatment well    Behavior During Therapy  WFL for tasks assessed/performed       Past Medical History:  Diagnosis Date  . Arthritis    oa  . Complication of anesthesia    told to breathe twice after a morton's neuroma surgery 2003, did fine with other surgeries since  . GERD (gastroesophageal reflux disease)   . Headache   . History of kidney stones    passed on own  . Hypertension   . Medical history non-contributory   . Scoliosis   . Vitamin D deficiency 03/25/2016    Past Surgical History:  Procedure Laterality Date  . ANKLE SURGERY Right 10 year ago  . cervical cryoablation  1998  . CHOLECYSTECTOMY  17 years ago  . COLONOSCOPY N/A 09/13/2013   Procedure: COLONOSCOPY;  Surgeon: Rogene Houston, MD;  [facility];  Service: Endoscopy;  Laterality: N/A;  730-moved to 47 Ann to notify pt  . FOOT SURGERY     Morton's neuroma  . KNEE SURGERY Left 10 years ago   torn ligemant and tendon repair  . TOTAL KNEE ARTHROPLASTY Right 10/25/2017   Procedure: RIGHT TOTAL KNEE ARTHROPLASTY;  Surgeon: Gaynelle Arabian, MD;  [facility];  Service: Orthopedics;  Laterality: Right;  Adductor Block  . TUBAL LIGATION  19 years ago    There were no vitals filed for this visit.  Subjective Assessment - 06/28/18 1302    Subjective  Patient  reports she was in a lot of pain over the weekend after last visit but reports she's gotten better    Pertinent History  Right total knee replacement.    Limitations  Standing    How long can you stand comfortably?  5 minutes.    Patient Stated Goals  Walk without pain.    Currently in Pain?  Yes    Pain Score  2     Pain Location  Foot    Pain Orientation  Left    Pain Descriptors / Indicators  Aching;Dull         [facility] PT Assessment - 06/28/18 0001      Assessment   Medical Diagnosis  Left plantar fasciitis and ankle tendinitis.                   [facility] Adult PT Treatment/Exercise - 06/28/18 0001      Modalities   Modalities  Electrical Stimulation;Ultrasound      Electrical Stimulation   Electrical Stimulation Location  Left heel/medial arch and left Achilles.    Electrical Stimulation Action  IFC    Electrical Stimulation Parameters  80-150 hz x15 mins    Electrical Stimulation Goals  Pain      Ultrasound  Ultrasound Location  Left achilles and plantar surface    Ultrasound Parameters  combo e-stim 1 mhz, 100%, 1.5 w/cm2    Ultrasound Goals  Pain      Vasopneumatic   Number Minutes Vasopneumatic   15 minutes    Vasopnuematic Location   Ankle    Vasopneumatic Pressure  Low      Manual Therapy   Manual Therapy  Soft tissue mobilization;Taping    Manual therapy comments  --    Soft tissue mobilization  In right sidelying: STW/M to affected left Achilles and plantar surface of left foot x 12 minutes.    Kinesiotex  Inhibit Muscle      Kinesiotix   Inhibit Muscle   Achilles taping to inhibit muscle                  PT Long Term Goals - 06/23/18 1751      PT LONG TERM GOAL #1   Title  I with advanced HEP    Time  6    Period  Weeks    Status  New      PT LONG TERM GOAL #2   Title  Increase left ankle dorsiflexion to 10 degrees with knee in full extension to normalize the patient's gait pattern.    Time  6    Period  Weeks    Status   New      PT LONG TERM GOAL #3   Title  Stand 20 minutes with pain not > 2-3/10.    Time  6    Period  Weeks    Status  New      PT LONG TERM GOAL #4   Title  Pain level upon rising not > 2-3/10.    Time  6    Period  Weeks    Status  New            Plan - 06/28/18 1512    Clinical Impression Statement  Patient was able to tolerate treatment despite reports of pain during STW/M to Achilles, gastroc, and plantar surface of left foot. Patient noted with multiple trigger points in left calf. Patient educated to freeze a water bottle and roll her foot on it to decrease pain and break up adhesions in plantar fascia. Patient reported understanding. Normal response to modalities at end of session.    Clinical Presentation  Evolving    Clinical Decision Making  Low    Rehab Potential  Excellent    PT Frequency  2x / week    PT Duration  6 weeks    PT Treatment/Interventions  ADLs/Self Care Home Management;Cryotherapy;Electrical Stimulation;Iontophoresis 4mg /ml Dexamethasone;Therapeutic exercise;Therapeutic activities;Patient/family education;Passive range of motion;Manual techniques;Dry needling;Vasopneumatic Device    PT Next Visit Plan  Combo e'stim/U/S to left foot (plantar surface) and distal calf; IASTM; Gastroc-Soleus stretching; vasopneumatic and electrical stimulation.  Add taping.  Review standing left gastroc-soleus stretch.    Consulted and Agree with Plan of Care  Patient       Patient will benefit from skilled therapeutic intervention in order to improve the following deficits and impairments:  Pain, Decreased activity tolerance, Decreased range of motion  Visit Diagnosis: Pain in left foot  Stiffness of left ankle, not elsewhere classified     Problem List Patient Active Problem List   Diagnosis Date Noted  . OA (osteoarthritis) of knee 10/25/2017  . Well woman exam with routine gynecological exam 06/16/2017  . Vitamin D deficiency 03/25/2016   Torrie Mayers  Zayaan Kozak, PT, DPT 06/28/2018, 3:26 PM  [facility] [address] [place], [place], [address] Phone: [phone]   Fax:  [phone]  Name: Sarah Hicks MRN: [id number] Date of Birth: 04-14-1961

## 2018-07-01 ENCOUNTER — Ambulatory Visit: Payer: 59 | Admitting: Physical Therapy

## 2018-07-01 DIAGNOSIS — M79672 Pain in left foot: Secondary | ICD-10-CM | POA: Diagnosis not present

## 2018-07-01 DIAGNOSIS — M25672 Stiffness of left ankle, not elsewhere classified: Secondary | ICD-10-CM

## 2018-07-01 NOTE — Therapy (Signed)
[facility] [place], [place], [address] Phone: [phone]   Fax:  [phone]  Physical Therapy Treatment  Patient Details  Name: Sarah Hicks MRN: [id number] Date of Birth: 1961/08/23 Referring Provider: Vickki Hearing MD   Encounter Date: 07/01/2018  PT End of Session - 07/01/18 1212    Visit Number  4    Number of Visits  12    Date for PT Re-Evaluation  08/04/18    Authorization Type  FOTO AT LEAST EVERY 5TH VISIT, 10TH VISIT PROGRESS NOTE.    PT Start Time  [phone]    PT Stop Time  1048    PT Time Calculation (min)  58 min    Activity Tolerance  Patient tolerated treatment well    Behavior During Therapy  WFL for tasks assessed/performed       Past Medical History:  Diagnosis Date  . Arthritis    oa  . Complication of anesthesia    told to breathe twice after a morton's neuroma surgery 2003, did fine with other surgeries since  . GERD (gastroesophageal reflux disease)   . Headache   . History of kidney stones    passed on own  . Hypertension   . Medical history non-contributory   . Scoliosis   . Vitamin D deficiency 03/25/2016    Past Surgical History:  Procedure Laterality Date  . ANKLE SURGERY Right 10 year ago  . cervical cryoablation  1998  . CHOLECYSTECTOMY  17 years ago  . COLONOSCOPY N/A 09/13/2013   Procedure: COLONOSCOPY;  Surgeon: Rogene Houston, MD;  [facility];  Service: Endoscopy;  Laterality: N/A;  730-moved to 66 Ann to notify pt  . FOOT SURGERY     Morton's neuroma  . KNEE SURGERY Left 10 years ago   torn ligemant and tendon repair  . TOTAL KNEE ARTHROPLASTY Right 10/25/2017   Procedure: RIGHT TOTAL KNEE ARTHROPLASTY;  Surgeon: Gaynelle Arabian, MD;  [facility];  Service: Orthopedics;  Laterality: Right;  Adductor Block  . TUBAL LIGATION  19 years ago    There were no vitals filed for this visit.  Subjective Assessment - 07/01/18 1158    Subjective  Treatment  make me sore but are helping.  Today my pain is a 2.    Currently in Pain?  Yes    Pain Score  2     Pain Orientation  Left    Pain Descriptors / Indicators  Aching;Dull    Pain Type  Chronic pain    Pain Onset  More than a month ago                       [facility] Adult PT Treatment/Exercise - 07/01/18 0001      Modalities   Modalities  Electrical Stimulation;Ultrasound;Vasopneumatic      Electrical Stimulation   Electrical Stimulation Location  Left heel/medial arch and Achilles    Electrical Stimulation Action  Pre-mod to both regions.    Electrical Stimulation Parameters  80-150 Hz x 20 minutes.    Electrical Stimulation Goals  Pain      Ultrasound   Ultrasound Location  Left Achilles/left heel and medial arch.    Ultrasound Parameters  Combo e'stim/U/S at 1.50 W/CM2 x 12 minutes.    Ultrasound Goals  Pain      Vasopneumatic   Number Minutes Vasopneumatic   20 minutes    Vasopnuematic Location   --   Left  foot/ankle.   Vasopneumatic Pressure  Medium      Manual Therapy   Manual Therapy  Soft tissue mobilization    Soft tissue mobilization  In prone with kneecaps off table:  STW/M x 11 minutes to left calf trigger points, Achilles and heel and plantar surface of left foot.                  PT Long Term Goals - 06/23/18 1751      PT LONG TERM GOAL #1   Title  I with advanced HEP    Time  6    Period  Weeks    Status  New      PT LONG TERM GOAL #2   Title  Increase left ankle dorsiflexion to 10 degrees with knee in full extension to normalize the patient's gait pattern.    Time  6    Period  Weeks    Status  New      PT LONG TERM GOAL #3   Title  Stand 20 minutes with pain not > 2-3/10.    Time  6    Period  Weeks    Status  New      PT LONG TERM GOAL #4   Title  Pain level upon rising not > 2-3/10.    Time  6    Period  Weeks    Status  New            Plan - 07/01/18 1209    Clinical Impression Statement  Patient did well  today.  Tried IASTM but was not comfortable for patient at this time.  She has been sore after treatments but feels the treatments are helping.      PT Treatment/Interventions  ADLs/Self Care Home Management;Cryotherapy;Electrical Stimulation;Iontophoresis 4mg /ml Dexamethasone;Therapeutic exercise;Therapeutic activities;Patient/family education;Passive range of motion;Manual techniques;Dry needling;Vasopneumatic Device    PT Next Visit Plan  Combo e'stim/U/S to left foot (plantar surface) and distal calf; IASTM; Gastroc-Soleus stretching; vasopneumatic and electrical stimulation.  Add taping.  Review standing left gastroc-soleus stretch.    Consulted and Agree with Plan of Care  Patient       Patient will benefit from skilled therapeutic intervention in order to improve the following deficits and impairments:  Pain, Decreased activity tolerance, Decreased range of motion  Visit Diagnosis: Pain in left foot  Stiffness of left ankle, not elsewhere classified     Problem List Patient Active Problem List   Diagnosis Date Noted  . OA (osteoarthritis) of knee 10/25/2017  . Well woman exam with routine gynecological exam 06/16/2017  . Vitamin D deficiency 03/25/2016    Lyndon Chapel, [place] MPT 07/01/2018, 12:12 PM  [facility] [address] [place], [place], [address] Phone: [phone]   Fax:  [phone]  Name: Sarah Hicks MRN: [id number] Date of Birth: [birth year]

## 2018-07-04 ENCOUNTER — Ambulatory Visit: Payer: 59 | Admitting: Physical Therapy

## 2018-07-04 ENCOUNTER — Encounter: Payer: Self-pay | Admitting: Physical Therapy

## 2018-07-04 DIAGNOSIS — M25672 Stiffness of left ankle, not elsewhere classified: Secondary | ICD-10-CM

## 2018-07-04 DIAGNOSIS — M79672 Pain in left foot: Secondary | ICD-10-CM | POA: Diagnosis not present

## 2018-07-04 NOTE — Therapy (Signed)
[facility] [place], [place], [address] Phone: [phone]   Fax:  [phone]  Physical Therapy Treatment  Patient Details  Name: Sarah Hicks MRN: [id number] Date of Birth: 1961-04-20 Referring Provider: Vickki Hearing MD   Encounter Date: 07/04/2018  PT End of Session - 07/04/18 1032    Visit Number  5    Number of Visits  12    Date for PT Re-Evaluation  08/04/18    Authorization Type  FOTO AT LEAST EVERY 5TH VISIT, 10TH VISIT PROGRESS NOTE.    PT Start Time  [phone]    PT Stop Time  1040    PT Time Calculation (min)  54 min    Activity Tolerance  Patient tolerated treatment well    Behavior During Therapy  WFL for tasks assessed/performed       Past Medical History:  Diagnosis Date  . Arthritis    oa  . Complication of anesthesia    told to breathe twice after a morton's neuroma surgery 2003, did fine with other surgeries since  . GERD (gastroesophageal reflux disease)   . Headache   . History of kidney stones    passed on own  . Hypertension   . Medical history non-contributory   . Scoliosis   . Vitamin D deficiency 03/25/2016    Past Surgical History:  Procedure Laterality Date  . ANKLE SURGERY Right 10 year ago  . cervical cryoablation  1998  . CHOLECYSTECTOMY  17 years ago  . COLONOSCOPY N/A 09/13/2013   Procedure: COLONOSCOPY;  Surgeon: Rogene Houston, MD;  [facility];  Service: Endoscopy;  Laterality: N/A;  730-moved to 32 Ann to notify pt  . FOOT SURGERY     Morton's neuroma  . KNEE SURGERY Left 10 years ago   torn ligemant and tendon repair  . TOTAL KNEE ARTHROPLASTY Right 10/25/2017   Procedure: RIGHT TOTAL KNEE ARTHROPLASTY;  Surgeon: Gaynelle Arabian, MD;  [facility]: [facility];  Service: Orthopedics;  Laterality: Right;  Adductor Block  . TUBAL LIGATION  19 years ago    There were no vitals filed for this visit.  Subjective Assessment - 07/04/18 0950    Subjective  Patient  arrived with increased pain after last treatment    Pertinent History  Right total knee replacement.    Limitations  Standing    How long can you stand comfortably?  5 minutes.    Patient Stated Goals  Walk without pain.    Currently in Pain?  Yes    Pain Score  5     Pain Location  Foot    Pain Orientation  Left    Pain Descriptors / Indicators  Aching    Pain Onset  More than a month ago    Pain Frequency  Constant    Aggravating Factors   laying in a certain position    Pain Relieving Factors  sitting with suport on leg                       [facility] Adult PT Treatment/Exercise - 07/04/18 0001      Electrical Stimulation   Electrical Stimulation Location  Left heel/medial arch and Achilles    Electrical Stimulation Action  premod    Electrical Stimulation Parameters  80-150hz  x10min    Electrical Stimulation Goals  Pain      Ultrasound   Ultrasound Location  left achilles    Ultrasound Parameters  [place] @  1.5w/cm2/50%/98mhz x48min    Ultrasound Goals  Pain      Vasopneumatic   Number Minutes Vasopneumatic   15 minutes    Vasopnuematic Location   Ankle    Vasopneumatic Pressure  Low      Manual Therapy   Manual Therapy  Soft tissue mobilization;Myofascial release    Manual therapy comments  manual STW/MFR to achilles and calf muscle to reduce pain and tone    Soft tissue mobilization  manual STW and stretching to plantar fascia and plantar surface                  PT Long Term Goals - 07/04/18 1034      PT LONG TERM GOAL #1   Title  I with advanced HEP    Time  6    Period  Weeks    Status  On-going      PT LONG TERM GOAL #2   Title  Increase left ankle dorsiflexion to 10 degrees with knee in full extension to normalize the patient's gait pattern.    Time  6    Period  Weeks    Status  On-going      PT LONG TERM GOAL #3   Title  Stand 20 minutes with pain not > 2-3/10.    Time  6    Period  Weeks    Status  On-going   4-6/10 07/04/18      PT LONG TERM GOAL #4   Title  Pain level upon rising not > 2-3/10.    Time  6    Period  Weeks    Status  On-going   4-6/10 07/04/18           Plan - 07/04/18 1035    Clinical Impression Statement  Patient tolerated treatment well today. patient arrived with increased soreness after last treatment. Today focused on [place] to achilles ares followed by manual STW/MFR/stretching to left ankle and foot to reduce pain. FOTO improved this week. Goals progressing due to limitations with prolong standing discomfort.    Rehab Potential  Excellent    PT Frequency  2x / week    PT Duration  6 weeks    PT Treatment/Interventions  ADLs/Self Care Home Management;Cryotherapy;Electrical Stimulation;Iontophoresis 4mg /ml Dexamethasone;Therapeutic exercise;Therapeutic activities;Patient/family education;Passive range of motion;Manual techniques;Dry needling;Vasopneumatic Device    PT Next Visit Plan  Combo e'stim/U/S to left foot (plantar surface) and distal calf; IASTM; Gastroc-Soleus stretching; vasopneumatic and electrical stimulation/ taping.  Review standing left gastroc-soleus stretch.    Consulted and Agree with Plan of Care  Patient       Patient will benefit from skilled therapeutic intervention in order to improve the following deficits and impairments:  Pain, Decreased activity tolerance, Decreased range of motion  Visit Diagnosis: Pain in left foot  Stiffness of left ankle, not elsewhere classified     Problem List Patient Active Problem List   Diagnosis Date Noted  . OA (osteoarthritis) of knee 10/25/2017  . Well woman exam with routine gynecological exam 06/16/2017  . Vitamin D deficiency 03/25/2016    Phillips Climes, PTA 07/04/2018, 10:43 AM  [facility] [place], [place], [address] Phone: [phone]   Fax:  [phone]  Name: Sarah Hicks MRN: [id number] Date of Birth: June 19, 1961

## 2018-07-06 ENCOUNTER — Ambulatory Visit: Payer: 59 | Admitting: Physical Therapy

## 2018-07-06 ENCOUNTER — Encounter: Payer: Self-pay | Admitting: Physical Therapy

## 2018-07-06 DIAGNOSIS — M79672 Pain in left foot: Secondary | ICD-10-CM | POA: Diagnosis not present

## 2018-07-06 DIAGNOSIS — M25672 Stiffness of left ankle, not elsewhere classified: Secondary | ICD-10-CM

## 2018-07-06 NOTE — Therapy (Signed)
[facility] [place], [place], [address] Phone: [phone]   Fax:  [phone]  Physical Therapy Treatment  Patient Details  Name: Sarah Hicks MRN: [id number] Date of Birth: Oct 23, 1960 Referring Provider: Vickki Hearing MD   Encounter Date: 07/06/2018  PT End of Session - 07/06/18 1031    Visit Number  6    Number of Visits  12    Date for PT Re-Evaluation  08/04/18    Authorization Type  FOTO AT LEAST EVERY 5TH VISIT, 10TH VISIT PROGRESS NOTE.    PT Start Time  [phone]    PT Stop Time  1042    PT Time Calculation (min)  58 min    Activity Tolerance  Patient tolerated treatment well    Behavior During Therapy  WFL for tasks assessed/performed       Past Medical History:  Diagnosis Date  . Arthritis    oa  . Complication of anesthesia    told to breathe twice after a morton's neuroma surgery 2003, did fine with other surgeries since  . GERD (gastroesophageal reflux disease)   . Headache   . History of kidney stones    passed on own  . Hypertension   . Medical history non-contributory   . Scoliosis   . Vitamin D deficiency 03/25/2016    Past Surgical History:  Procedure Laterality Date  . ANKLE SURGERY Right 10 year ago  . cervical cryoablation  1998  . CHOLECYSTECTOMY  17 years ago  . COLONOSCOPY N/A 09/13/2013   Procedure: COLONOSCOPY;  Surgeon: Rogene Houston, MD;  [facility];  Service: Endoscopy;  Laterality: N/A;  730-moved to 64 Ann to notify pt  . FOOT SURGERY     Morton's neuroma  . KNEE SURGERY Left 10 years ago   torn ligemant and tendon repair  . TOTAL KNEE ARTHROPLASTY Right 10/25/2017   Procedure: RIGHT TOTAL KNEE ARTHROPLASTY;  Surgeon: Gaynelle Arabian, MD;  [facility];  Service: Orthopedics;  Laterality: Right;  Adductor Block  . TUBAL LIGATION  19 years ago    There were no vitals filed for this visit.  Subjective Assessment - 07/06/18 0956    Subjective  Patient  arrived with decreased pain and did well after last treatment    Pertinent History  Right total knee replacement.    Limitations  Standing    How long can you stand comfortably?  5 minutes.    Patient Stated Goals  Walk without pain.    Currently in Pain?  Yes    Pain Score  2     Pain Location  Foot    Pain Orientation  Left    Pain Descriptors / Indicators  Aching    Pain Type  Chronic pain    Pain Onset  More than a month ago    Pain Frequency  Constant    Aggravating Factors   laying in a certain position    Pain Relieving Factors  suppost under leg                       [facility] Adult PT Treatment/Exercise - 07/06/18 0001      Exercises   Exercises  Ankle      Electrical Stimulation   Electrical Stimulation Location  Left heel/medial arch and Achilles    Electrical Stimulation Action  premod    Electrical Stimulation Parameters  80-150hz  x68min    Electrical Stimulation Goals  Pain  Ultrasound   Ultrasound Location  left achilles    Ultrasound Parameters  [place] @ 1.5w/cm2/50%/20mhz x69min    Ultrasound Goals  Pain      Vasopneumatic   Number Minutes Vasopneumatic   15 minutes    Vasopnuematic Location   Ankle    Vasopneumatic Pressure  Low      Manual Therapy   Manual Therapy  Soft tissue mobilization;Myofascial release;Taping    Manual therapy comments  manual STW/MFR to achilles and calf muscle to reduce pain and tone    Soft tissue mobilization  manual STW and stretching to plantar fascia and plantar surface    Kinesiotex  Inhibit Muscle      Kinesiotix   Inhibit Muscle   Achilles taping to inhibit muscle      Ankle Exercises: Standing   Rocker Board  2 minutes      Ankle Exercises: Supine   T-Band  yellow 2x10 each way (4)                  PT Long Term Goals - 07/04/18 1034      PT LONG TERM GOAL #1   Title  I with advanced HEP    Time  6    Period  Weeks    Status  On-going      PT LONG TERM GOAL #2   Title  Increase  left ankle dorsiflexion to 10 degrees with knee in full extension to normalize the patient's gait pattern.    Time  6    Period  Weeks    Status  On-going      PT LONG TERM GOAL #3   Title  Stand 20 minutes with pain not > 2-3/10.    Time  6    Period  Weeks    Status  On-going   4-6/10 07/04/18     PT LONG TERM GOAL #4   Title  Pain level upon rising not > 2-3/10.    Time  6    Period  Weeks    Status  On-going   4-6/10 07/04/18           Plan - 07/06/18 1025    Clinical Impression Statement  Patient tolerated treatment well today. patient able to progress with gentle exercises today. Patient has ongoing palpable discomfort on achilles and Plantar facsia. Patient is doing well with self stretches at home. Goals ongoing at this time.     Rehab Potential  Excellent    PT Frequency  2x / week    PT Duration  6 weeks    PT Treatment/Interventions  ADLs/Self Care Home Management;Cryotherapy;Electrical Stimulation;Iontophoresis 4mg /ml Dexamethasone;Therapeutic exercise;Therapeutic activities;Patient/family education;Passive range of motion;Manual techniques;Dry needling;Vasopneumatic Device    PT Next Visit Plan  cont with POC for gentle ROM/dtrengthening ankle/US/manual STW stretching; vasopneumatic and electrical stimulation/ taping    Consulted and Agree with Plan of Care  Patient       Patient will benefit from skilled therapeutic intervention in order to improve the following deficits and impairments:  Pain, Decreased activity tolerance, Decreased range of motion  Visit Diagnosis: Pain in left foot  Stiffness of left ankle, not elsewhere classified     Problem List Patient Active Problem List   Diagnosis Date Noted  . OA (osteoarthritis) of knee 10/25/2017  . Well woman exam with routine gynecological exam 06/16/2017  . Vitamin D deficiency 03/25/2016    DUNFORD, CHRISTINA P, PTA 07/06/2018, 10:49 AM  [facility] [address]  [place], [place], [address] Phone: [phone]   Fax:  [phone]  Name: Sarah Hicks MRN: [id number] Date of Birth: Dec 17, 1960

## 2018-07-18 ENCOUNTER — Ambulatory Visit: Payer: 59 | Attending: Sports Medicine | Admitting: Physical Therapy

## 2018-07-18 ENCOUNTER — Encounter: Payer: Self-pay | Admitting: Physical Therapy

## 2018-07-18 DIAGNOSIS — M79672 Pain in left foot: Secondary | ICD-10-CM | POA: Insufficient documentation

## 2018-07-18 DIAGNOSIS — M25661 Stiffness of right knee, not elsewhere classified: Secondary | ICD-10-CM | POA: Insufficient documentation

## 2018-07-18 DIAGNOSIS — M25672 Stiffness of left ankle, not elsewhere classified: Secondary | ICD-10-CM | POA: Diagnosis present

## 2018-07-18 NOTE — Therapy (Signed)
[facility] [place], [place], [address] Phone: [phone]   Fax:  [phone]  Physical Therapy Treatment  Patient Details  Name: Sarah Hicks MRN: [id number] Date of Birth: [birth year] Referring Provider (PT): Vickki Hearing MD   Encounter Date: 07/18/2018  PT End of Session - 07/18/18 1033    Visit Number  7    Number of Visits  12    Date for PT Re-Evaluation  08/04/18    Authorization Type  FOTO AT LEAST EVERY 5TH VISIT, 10TH VISIT PROGRESS NOTE.    PT Start Time  [phone]    PT Stop Time  1044    PT Time Calculation (min)  58 min    Activity Tolerance  Patient tolerated treatment well    Behavior During Therapy  WFL for tasks assessed/performed       Past Medical History:  Diagnosis Date  . Arthritis    oa  . Complication of anesthesia    told to breathe twice after a morton's neuroma surgery 2003, did fine with other surgeries since  . GERD (gastroesophageal reflux disease)   . Headache   . History of kidney stones    passed on own  . Hypertension   . Medical history non-contributory   . Scoliosis   . Vitamin D deficiency 03/25/2016    Past Surgical History:  Procedure Laterality Date  . ANKLE SURGERY Right 10 year ago  . cervical cryoablation  1998  . CHOLECYSTECTOMY  17 years ago  . COLONOSCOPY N/A 09/13/2013   Procedure: COLONOSCOPY;  Surgeon: Rogene Houston, MD;  [facility]: [facility];  Service: Endoscopy;  Laterality: N/A;  730-moved to 64 Ann to notify pt  . FOOT SURGERY     Morton's neuroma  . KNEE SURGERY Left 10 years ago   torn ligemant and tendon repair  . TOTAL KNEE ARTHROPLASTY Right 10/25/2017   Procedure: RIGHT TOTAL KNEE ARTHROPLASTY;  Surgeon: Gaynelle Arabian, MD;  [facility];  Service: Orthopedics;  Laterality: Right;  Adductor Block  . TUBAL LIGATION  19 years ago    There were no vitals filed for this visit.  Subjective Assessment - 07/18/18 0952    Subjective  Patient  arrived with minimal discomfort, went on vacation and did well with discomfort    Pertinent History  Right total knee replacement.    Limitations  Standing    How long can you stand comfortably?  5 minutes.    Patient Stated Goals  Walk without pain.    Currently in Pain?  Yes    Pain Score  1     Pain Location  Foot    Pain Orientation  Left    Pain Descriptors / Indicators  Discomfort    Pain Type  Chronic pain    Pain Onset  More than a month ago    Pain Frequency  Intermittent    Aggravating Factors   daily movements    Pain Relieving Factors  at rest                       [facility] Adult PT Treatment/Exercise - 07/18/18 0001      Electrical Stimulation   Electrical Stimulation Location  left achilles    Electrical Stimulation Action  premod    Electrical Stimulation Parameters  80-150hz x60mn    Electrical Stimulation Goals  Pain      Ultrasound   Ultrasound Location  left achilles  Ultrasound Parameters  [place] @ 1.5w/cm2/50%/40mz x1349m      Vasopneumatic   Number Minutes Vasopneumatic   15 minutes    Vasopnuematic Location   Ankle    Vasopneumatic Pressure  Low      Manual Therapy   Manual Therapy  Soft tissue mobilization;Myofascial release;Taping    Manual therapy comments  manual STW/MFR to achilles and calf muscle to reduce pain and tone    Kinesiotex  Inhibit Muscle      Kinesiotix   Inhibit Muscle   Achilles taping to inhibit muscle      Ankle Exercises: Standing   Rocker Board  3 minutes    Heel Raises Limitations  heel lift with eccentric lowering on Left LE      Ankle Exercises: Supine   T-Band  yellow 2x10 each way (4)                  PT Long Term Goals - 07/18/18 1037      PT LONG TERM GOAL #1   Title  I with advanced HEP    Time  6    Period  Weeks    Status  On-going      PT LONG TERM GOAL #2   Title  Increase left ankle dorsiflexion to 10 degrees with knee in full extension to normalize the patient's gait  pattern.    Time  6    Period  Weeks    Status  On-going      PT LONG TERM GOAL #3   Title  Stand 20 minutes with pain not > 2-3/10.    Baseline  Met 07/18/18    Time  6    Period  Weeks    Status  Achieved      PT LONG TERM GOAL #4   Title  Pain level upon rising not > 2-3/10.    Baseline  Met 07/18/18    Time  6    Period  Weeks    Status  Achieved            Plan - 07/18/18 1036    Clinical Impression Statement  Patient tolerated treatment well today. Patient has reported no pain in plantar fascia only in achilles area. Patient able to perform ADL's with greater ease. Patient has met LTG #3 and #4 today with others ongoing at this time.     Rehab Potential  Excellent    PT Frequency  2x / week    PT Duration  6 weeks    PT Treatment/Interventions  ADLs/Self Care Home Management;Cryotherapy;Electrical Stimulation;Iontophoresis 49m81ml Dexamethasone;Therapeutic exercise;Therapeutic activities;Patient/family education;Passive range of motion;Manual techniques;Dry needling;Vasopneumatic Device    PT Next Visit Plan  cont with POC for gentle ROM/dtrengthening ankle/US/manual STW stretching; vasopneumatic and electrical stimulation/ taping    Consulted and Agree with Plan of Care  Patient       Patient will benefit from skilled therapeutic intervention in order to improve the following deficits and impairments:  Pain, Decreased activity tolerance, Decreased range of motion  Visit Diagnosis: Pain in left foot  Stiffness of left ankle, not elsewhere classified     Problem List Patient Active Problem List   Diagnosis Date Noted  . OA (osteoarthritis) of knee 10/25/2017  . Well woman exam with routine gynecological exam 06/16/2017  . Vitamin D deficiency 03/25/2016    DUNPhillips ClimesTA 07/18/2018, 10:52 AM  [facility]: [phone]  Fax:  [phone]  Name: Sarah Hicks MRN: [id number] Date of Birth: [birth year]

## 2018-07-20 ENCOUNTER — Encounter: Payer: Self-pay | Admitting: Physical Therapy

## 2018-07-20 ENCOUNTER — Ambulatory Visit: Payer: 59 | Admitting: Physical Therapy

## 2018-07-20 DIAGNOSIS — M25672 Stiffness of left ankle, not elsewhere classified: Secondary | ICD-10-CM

## 2018-07-20 DIAGNOSIS — M79672 Pain in left foot: Secondary | ICD-10-CM | POA: Diagnosis not present

## 2018-07-20 NOTE — Therapy (Signed)
[facility] [place], [place], [address] Phone: [phone]   Fax:  [phone]  Physical Therapy Treatment  Patient Details  Name: Sarah Hicks MRN: [id number] Date of Birth: 12-31-1960 Referring Provider (PT): Vickki Hearing MD   Encounter Date: 07/20/2018  PT End of Session - 07/20/18 1022    Visit Number  8    Number of Visits  12    Date for PT Re-Evaluation  08/04/18    Authorization Type  FOTO AT LEAST EVERY 5TH VISIT, 10TH VISIT PROGRESS NOTE.    PT Start Time  [phone]    PT Stop Time  1035    PT Time Calculation (min)  51 min    Activity Tolerance  Patient tolerated treatment well    Behavior During Therapy  WFL for tasks assessed/performed       Past Medical History:  Diagnosis Date  . Arthritis    oa  . Complication of anesthesia    told to breathe twice after a morton's neuroma surgery 2003, did fine with other surgeries since  . GERD (gastroesophageal reflux disease)   . Headache   . History of kidney stones    passed on own  . Hypertension   . Medical history non-contributory   . Scoliosis   . Vitamin D deficiency 03/25/2016    Past Surgical History:  Procedure Laterality Date  . ANKLE SURGERY Right 10 year ago  . cervical cryoablation  1998  . CHOLECYSTECTOMY  17 years ago  . COLONOSCOPY N/A 09/13/2013   Procedure: COLONOSCOPY;  Surgeon: Rogene Houston, MD;  [facility];  Service: Endoscopy;  Laterality: N/A;  730-moved to 9 Ann to notify pt  . FOOT SURGERY     Morton's neuroma  . KNEE SURGERY Left 10 years ago   torn ligemant and tendon repair  . TOTAL KNEE ARTHROPLASTY Right 10/25/2017   Procedure: RIGHT TOTAL KNEE ARTHROPLASTY;  Surgeon: Gaynelle Arabian, MD;  [facility];  Service: Orthopedics;  Laterality: Right;  Adductor Block  . TUBAL LIGATION  19 years ago    There were no vitals filed for this visit.  Subjective Assessment - 07/20/18 0946    Subjective  Patient  reported taping has continued to help and doing well overall    Pertinent History  Right total knee replacement.    Limitations  Standing    How long can you stand comfortably?  5 minutes.    Patient Stated Goals  Walk without pain.    Currently in Pain?  Yes    Pain Score  1     Pain Location  Foot   achilles   Pain Orientation  Left    Pain Descriptors / Indicators  Discomfort    Pain Type  Chronic pain    Pain Onset  More than a month ago    Pain Frequency  Intermittent    Aggravating Factors   certain daily movement such as prolong activity or pressure on achilles    Pain Relieving Factors  at rest and taping                       [facility] Adult PT Treatment/Exercise - 07/20/18 0001      Electrical Stimulation   Electrical Stimulation Location  left achilles    Electrical Stimulation Action  premod    Electrical Stimulation Parameters  80-150hz  x94mn    Electrical Stimulation Goals  Pain  Ultrasound   Ultrasound Location  left achilles    Ultrasound Parameters  [place] @ 1.5w/cm2/50%/55mz x173m    Ultrasound Goals  Pain      Vasopneumatic   Number Minutes Vasopneumatic   15 minutes    Vasopnuematic Location   Ankle      Manual Therapy   Manual Therapy  Taping    Kinesiotex  Inhibit Muscle      Kinesiotix   Inhibit Muscle   Achilles taping to inhibit muscle      Ankle Exercises: Standing   Rocker Board  3 minutes    Heel Raises Limitations  heel lift with eccentric lowering on Left LE      Ankle Exercises: Aerobic   Stationary Bike  [id number]             PT Education - 07/20/18 1000    Education Details  HEP with yellow t-band    Person(s) Educated  Patient    Methods  Explanation;Demonstration;Handout    Comprehension  Verbalized understanding;Returned demonstration          PT Long Term Goals - 07/18/18 1037      PT LONG TERM GOAL #1   Title  I with advanced HEP    Time  6    Period  Weeks    Status  On-going      PT LONG  TERM GOAL #2   Title  Increase left ankle dorsiflexion to 10 degrees with knee in full extension to normalize the patient's gait pattern.    Time  6    Period  Weeks    Status  On-going      PT LONG TERM GOAL #3   Title  Stand 20 minutes with pain not > 2-3/10.    Baseline  Met 07/18/18    Time  6    Period  Weeks    Status  Achieved      PT LONG TERM GOAL #4   Title  Pain level upon rising not > 2-3/10.    Baseline  Met 07/18/18    Time  6    Period  Weeks    Status  Achieved            Plan - 07/20/18 1025    Clinical Impression Statement  Patient tolerated treatment well today. Patient able to progress exercise bike today with no increased discomfort. HEP provided today with yellow t-band. Patient continues to only report discomfort in achilles and no longer any pain in plantar fascia. Patient progressing toward goals.     Rehab Potential  Excellent    PT Frequency  2x / week    PT Duration  6 weeks    PT Treatment/Interventions  ADLs/Self Care Home Management;Cryotherapy;Electrical Stimulation;Iontophoresis 4mg25m Dexamethasone;Therapeutic exercise;Therapeutic activities;Patient/family education;Passive range of motion;Manual techniques;Dry needling;Vasopneumatic Device    PT Next Visit Plan  cont with POC for gentle ROM/dtrengthening ankle/US/manual STW stretching; vasopneumatic and electrical stimulation/ taping       Patient will benefit from skilled therapeutic intervention in order to improve the following deficits and impairments:  Pain, Decreased activity tolerance, Decreased range of motion  Visit Diagnosis: Pain in left foot  Stiffness of left ankle, not elsewhere classified     Problem List Patient Active Problem List   Diagnosis Date Noted  . OA (osteoarthritis) of knee 10/25/2017  . Well woman exam with routine gynecological exam 06/16/2017  . Vitamin D deficiency 03/25/2016    DUNFORD, CHRISTINA P, PTA 07/20/2018, 10:37  [facility] [place], [place], [address] Phone: [phone]   Fax:  [phone]  Name: Sarah Hicks MRN: [id number] Date of Birth: [birth year]

## 2018-07-20 NOTE — Patient Instructions (Signed)
  Dorsiflexion: Resisted   Facing anchor, tubing around left foot, pull toward face.  Repeat _10___ times per set. Do __2__ sets per session. Do _2___ sessions per day.  http://orth.exer.us/8   Copyright  VHI. All rights reserved.  Plantar Flexion: Resisted   Anchor behind, tubing around left foot, press down. Repeat __10__ times per set. Do __2__ sets per session. Do ___2_ sessions per day.  http://orth.exer.us/10   Copyright  VHI. All rights reserved.  Inversion: Resisted   Cross legs with right leg underneath, foot in tubing loop. Hold tubing around other foot to resist and turn foot in. Repeat _10___ times per set. Do __2__ sets per session. Do _2___ sessions per day.  http://orth.exer.us/12   Copyright  VHI. All rights reserved.  Eversion: Resisted   With left foot in tubing loop, hold tubing around other foot to resist and turn foot out. Repeat _10___ times per set. Do __2__ sets per session. Do __2__ sessions per day.  http://orth.exer.us/14   Copyright  VHI. All rights reserved.   

## 2018-07-21 ENCOUNTER — Other Ambulatory Visit: Payer: Self-pay | Admitting: Adult Health

## 2018-07-21 DIAGNOSIS — Z1231 Encounter for screening mammogram for malignant neoplasm of breast: Secondary | ICD-10-CM

## 2018-07-25 ENCOUNTER — Ambulatory Visit: Payer: 59 | Admitting: Physical Therapy

## 2018-07-25 ENCOUNTER — Encounter: Payer: Self-pay | Admitting: Physical Therapy

## 2018-07-25 DIAGNOSIS — M79672 Pain in left foot: Secondary | ICD-10-CM

## 2018-07-25 DIAGNOSIS — M25672 Stiffness of left ankle, not elsewhere classified: Secondary | ICD-10-CM

## 2018-07-25 NOTE — Therapy (Signed)
[facility] [place], [place], [address] Phone: [phone]   Fax:  [phone]  Physical Therapy Treatment  Patient Details  Name: Sarah Hicks MRN: [id number] Date of Birth: [birth year] Referring Provider (PT): Vickki Hearing MD   Encounter Date: 07/25/2018  PT End of Session - 07/25/18 1026    Visit Number  9    Number of Visits  12    Date for PT Re-Evaluation  08/04/18    Authorization Type  FOTO AT LEAST EVERY 5TH VISIT, 10TH VISIT PROGRESS NOTE.    PT Start Time  0945    PT Stop Time  1036    PT Time Calculation (min)  51 min    Activity Tolerance  Patient tolerated treatment well    Behavior During Therapy  WFL for tasks assessed/performed       Past Medical History:  Diagnosis Date  . Arthritis    oa  . Complication of anesthesia    told to breathe twice after a morton's neuroma surgery 2003, did fine with other surgeries since  . GERD (gastroesophageal reflux disease)   . Headache   . History of kidney stones    passed on own  . Hypertension   . Medical history non-contributory   . Scoliosis   . Vitamin D deficiency 03/25/2016    Past Surgical History:  Procedure Laterality Date  . ANKLE SURGERY Right 10 year ago  . cervical cryoablation  1998  . CHOLECYSTECTOMY  17 years ago  . COLONOSCOPY N/A 09/13/2013   Procedure: COLONOSCOPY;  Surgeon: Rogene Houston, MD;  [facility];  Service: Endoscopy;  Laterality: N/A;  730-moved to [address] Ann to notify pt  . FOOT SURGERY     Morton's neuroma  . KNEE SURGERY Left 10 years ago   torn ligemant and tendon repair  . TOTAL KNEE ARTHROPLASTY Right 10/25/2017   Procedure: RIGHT TOTAL KNEE ARTHROPLASTY;  Surgeon: Gaynelle Arabian, MD;  Location: [facility];  Service: Orthopedics;  Laterality: Right;  Adductor Block  . TUBAL LIGATION  19 years ago    There were no vitals filed for this visit.  Subjective Assessment - 07/25/18 0952    Subjective  Patient  reported some increased discomfort after prolong walking [place] [employer]    Pertinent History  Right total knee replacement.    Limitations  Standing    How long can you stand comfortably?  5 minutes.    Patient Stated Goals  Walk without pain.    Currently in Pain?  Yes    Pain Score  5     Pain Location  Foot   achilles   Pain Orientation  Left    Pain Descriptors / Indicators  Discomfort    Pain Type  Chronic pain    Pain Onset  More than a month ago    Pain Frequency  Intermittent    Aggravating Factors   prolong walking    Pain Relieving Factors  at rest and taping                       [facility] Adult PT Treatment/Exercise - 07/25/18 0001      Electrical Stimulation   Electrical Stimulation Location  left achilles    Electrical Stimulation Action  premod    Electrical Stimulation Parameters  80-150hz  x79mn    Electrical Stimulation Goals  Pain      Ultrasound   Ultrasound Location  left achilles  Ultrasound Parameters  [place] 1.5w/cm2/50%/67mz x165m    Ultrasound Goals  Pain      Vasopneumatic   Number Minutes Vasopneumatic   15 minutes    Vasopnuematic Location   Ankle    Vasopneumatic Pressure  Low      Manual Therapy   Manual Therapy  Taping    Kinesiotex  Inhibit Muscle      Kinesiotix   Inhibit Muscle   Achilles taping to inhibit muscle      Ankle Exercises: Aerobic   Stationary Bike  1028mL1      Ankle Exercises: Standing   Rocker Board  3 minutes    Heel Raises Limitations  heel lift with eccentric lowering on Left LE                  PT Long Term Goals - 07/18/18 1037      PT LONG TERM GOAL #1   Title  I with advanced HEP    Time  6    Period  Weeks    Status  On-going      PT LONG TERM GOAL #2   Title  Increase left ankle dorsiflexion to 10 degrees with knee in full extension to normalize the patient's gait pattern.    Time  6    Period  Weeks    Status  On-going      PT LONG TERM GOAL #3   Title  Stand  20 minutes with pain not > 2-3/10.    Baseline  Met 07/18/18    Time  6    Period  Weeks    Status  Achieved      PT LONG TERM GOAL #4   Title  Pain level upon rising not > 2-3/10.    Baseline  Met 07/18/18    Time  6    Period  Weeks    Status  Achieved            Plan - 07/25/18 1022    Clinical Impression Statement  Patient tolerated treatment well today. Patient reported flare upafter prolong walking. Patient reported no discomfort in plantar fascia yet only in achilles up to 5/10. Patient able to complete all exercises today and felt better after last treatment today. Patient goals ongoing at this time due to pain limitations.     Rehab Potential  Excellent    PT Frequency  2x / week    PT Duration  6 weeks    PT Treatment/Interventions  ADLs/Self Care Home Management;Cryotherapy;Electrical Stimulation;Iontophoresis 4mg61m Dexamethasone;Therapeutic exercise;Therapeutic activities;Patient/family education;Passive range of motion;Manual techniques;Dry needling;Vasopneumatic Device    PT Next Visit Plan  cont with POC for gentle ROM/strengthening ankle/US/manual STW stretching; vasopneumatic and electrical stimulation/ taping    Consulted and Agree with Plan of Care  Patient       Patient will benefit from skilled therapeutic intervention in order to improve the following deficits and impairments:  Pain, Decreased activity tolerance, Decreased range of motion  Visit Diagnosis: Pain in left foot  Stiffness of left ankle, not elsewhere classified     Problem List Patient Active Problem List   Diagnosis Date Noted  . OA (osteoarthritis) of knee 10/25/2017  . Well woman exam with routine gynecological exam 06/16/2017  . Vitamin D deficiency 03/25/2016    DUNFPhillips ClimesA 07/25/2018, 10:40 AM  [facility], [place]ne: [phone]:  [phone]: TammSANAE WILLETTS  MRN:  [id number] Date of Birth: Jan 01, 1961

## 2018-07-27 ENCOUNTER — Encounter: Payer: Self-pay | Admitting: Physical Therapy

## 2018-07-27 ENCOUNTER — Ambulatory Visit: Payer: 59 | Admitting: Physical Therapy

## 2018-07-27 DIAGNOSIS — M79672 Pain in left foot: Secondary | ICD-10-CM

## 2018-07-27 DIAGNOSIS — M25672 Stiffness of left ankle, not elsewhere classified: Secondary | ICD-10-CM

## 2018-07-27 NOTE — Therapy (Signed)
[facility] [place], [place], [address] Phone: [phone]   Fax:  [phone]  Physical Therapy Treatment  Patient Details  Name: Sarah Hicks MRN: [id number] Date of Birth: 1961-06-16 Referring Provider (PT): Vickki Hearing MD   Encounter Date: 07/27/2018  PT End of Session - 07/27/18 1032    Visit Number  10    Number of Visits  12    Date for PT Re-Evaluation  08/04/18    Authorization Type  FOTO AT LEAST EVERY 5TH VISIT, 10TH VISIT PROGRESS NOTE.    PT Start Time  [id number]    PT Stop Time  1042    PT Time Calculation (min)  56 min    Activity Tolerance  Patient tolerated treatment well    Behavior During Therapy  WFL for tasks assessed/performed       Past Medical History:  Diagnosis Date  . Arthritis    oa  . Complication of anesthesia    told to breathe twice after a morton's neuroma surgery 2003, did fine with other surgeries since  . GERD (gastroesophageal reflux disease)   . Headache   . History of kidney stones    passed on own  . Hypertension   . Medical history non-contributory   . Scoliosis   . Vitamin D deficiency 03/25/2016    Past Surgical History:  Procedure Laterality Date  . ANKLE SURGERY Right 10 year ago  . cervical cryoablation  1998  . CHOLECYSTECTOMY  17 years ago  . COLONOSCOPY N/A 09/13/2013   Procedure: COLONOSCOPY;  Surgeon: Rogene Houston, MD;  [facility];  Service: Endoscopy;  Laterality: N/A;  730-moved to [address] to notify pt  . FOOT SURGERY     Morton's neuroma  . KNEE SURGERY Left 10 years ago   torn ligemant and tendon repair  . TOTAL KNEE ARTHROPLASTY Right 10/25/2017   Procedure: RIGHT TOTAL KNEE ARTHROPLASTY;  Surgeon: Gaynelle Arabian, MD;  Location: [facility];  Service: Orthopedics;  Laterality: Right;  Adductor Block  . TUBAL LIGATION  19 years ago    There were no vitals filed for this visit.  Subjective Assessment - 07/27/18 0948    Subjective   Patient reported she is feeling less discomfort after resting it yesterday    Pertinent History  Right total knee replacement.    Limitations  Standing    How long can you stand comfortably?  5 minutes.    Patient Stated Goals  Walk without pain.    Currently in Pain?  Yes    Pain Score  2     Pain Location  Foot   achilles   Pain Orientation  Left    Pain Descriptors / Indicators  Discomfort    Pain Type  Chronic pain    Pain Onset  More than a month ago    Pain Frequency  Intermittent    Aggravating Factors   any prolong standing or walking    Pain Relieving Factors  at rest          [facility] PT Assessment - 07/27/18 0001      ROM / Strength   AROM / PROM / Strength  AROM      AROM   AROM Assessment Site  Ankle    Right/Left Ankle  Left    Left Ankle Dorsiflexion  10                   [facility] Adult PT  Treatment/Exercise - 07/27/18 0001      Electrical Stimulation   Electrical Stimulation Location  left achilles    Electrical Stimulation Action  premod    Electrical Stimulation Parameters  80-_0 x79mn    Electrical Stimulation Goals  Pain      Ultrasound   Ultrasound Location  left achilles    Ultrasound Parameters  [place]1.5w/cm2/50%/181m x1026m   Ultrasound Goals  Pain      Vasopneumatic   Number Minutes Vasopneumatic   15 minutes    Vasopnuematic Location   Ankle    Vasopneumatic Pressure  Low      Manual Therapy   Manual Therapy  Taping    Kinesiotex  Inhibit Muscle      Kinesiotix   Inhibit Muscle   Achilles taping to inhibit muscle      Ankle Exercises: Aerobic   Stationary Bike  17m64m1      Ankle Exercises: Standing   Rocker Board  3 minutes    Heel Raises Limitations  4" step heel lift with eccentric lowering on Left LE x10      Ankle Exercises: Supine   T-Band  yellow 2x10 each way (4)             PT Education - 07/27/18 1032    Education Details  HEP    Person(s) Educated  Patient    Methods   Handout;Explanation;Demonstration    Comprehension  Verbalized understanding;Returned demonstration          PT Long Term Goals - 07/27/18 1008      PT LONG TERM GOAL #1   Title  I with advanced HEP    Baseline  Met 07/27/18    Time  6    Period  Weeks    Status  Achieved      PT LONG TERM GOAL #2   Title  Increase left ankle dorsiflexion to 10 degrees with knee in full extension to normalize the patient's gait pattern.    Baseline  Met 07/27/18    Period  Weeks    Status  Achieved      PT LONG TERM GOAL #3   Title  Stand 20 minutes with pain not > 2-3/10.    Baseline  Met 07/18/18    Time  6    Period  Weeks    Status  Achieved      PT LONG TERM GOAL #4   Title  Pain level upon rising not > 2-3/10.    Baseline  Met 07/18/18    Time  6    Period  Weeks    Status  Achieved      PT LONG TERM GOAL #5   Title  Patient to perform ADLs with 2/10 pain or less in R knee.    Time  8    Period  Weeks    Status  Achieved            Plan - 07/27/18 1037    Clinical Impression Statement  Patient tolerated treatment well today. Patient has reported decreased pain after rest. Patient has more discomfort with prolong walking. Patient given advanced HEP today. Patient has improved ROM WNL and has met all current goals. Patient going to ME fEmmons F/U appt. May DC after next visit per PT/MD discression.     Rehab Potential  Excellent    Clinical Impairments Affecting Rehab Potential  10th visit FOTO 43% limitation    PT Frequency  2x / week  PT Duration  6 weeks    PT Next Visit Plan  cont 1 visit review HEP and send MD. note for follow up and possible DC    Consulted and Agree with Plan of Care  Patient       Patient will benefit from skilled therapeutic intervention in order to improve the following deficits and impairments:  Pain, Decreased activity tolerance, Decreased range of motion  Visit Diagnosis: Pain in left foot  Stiffness of left ankle, not elsewhere  classified     Problem List Patient Active Problem List   Diagnosis Date Noted  . OA (osteoarthritis) of knee 10/25/2017  . Well woman exam with routine gynecological exam 06/16/2017  . Vitamin D deficiency 03/25/2016   Ladean Raya, PTA 07/27/18 11:05 AM  [facility] [place], [place], [address] Phone: [phone]   Fax:  [phone]  Name: Sarah Hicks MRN: [id number] Date of Birth: 1961/10/05  Progress Note Reporting Period 06/23/18 to 07/27/18  See note below for Objective Data and Assessment of Progress/Goals. Patient is making excellent progress toward goals.  She has currently met LTG's.  She is returning to doctor soon and will discuss discharge at this time.    Mali Applegate MPT

## 2018-07-27 NOTE — Patient Instructions (Addendum)
Plantar Fascia Stretch    Standing with only ball of left foot on stair, push heel down until stretch is felt through arch of foot. Hold __3__ seconds. Relax. Repeat __3-5__ times per set. Do ___1_ sets per session. Do _1___ sessions per day.   Gastroc Stretch    Stand with right foot back, leg straight, forward leg bent. Keeping heel on floor, turned slightly out, lean into wall until stretch is felt in calf. Hold _10___ seconds. Repeat __2__ times per set. Do _1-2___ sets per session. Do __1__ sessions per day.   Heel Raise: Bilateral (Standing)    Rise on balls of feet. Repeat _10___ times per set. Do _2___ sets per session. Do _1___ sessions per day.

## 2018-08-03 ENCOUNTER — Ambulatory Visit: Payer: 59 | Admitting: Physical Therapy

## 2018-08-08 ENCOUNTER — Ambulatory Visit: Payer: 59 | Admitting: Physical Therapy

## 2018-08-08 ENCOUNTER — Encounter: Payer: Self-pay | Admitting: Physical Therapy

## 2018-08-08 DIAGNOSIS — M25661 Stiffness of right knee, not elsewhere classified: Secondary | ICD-10-CM

## 2018-08-08 DIAGNOSIS — M25672 Stiffness of left ankle, not elsewhere classified: Secondary | ICD-10-CM

## 2018-08-08 DIAGNOSIS — M79672 Pain in left foot: Secondary | ICD-10-CM

## 2018-08-08 NOTE — Therapy (Signed)
[facility] [place], [place], [address] Phone: [phone]   Fax:  [phone]  Physical Therapy Treatment  Patient Details  Name: Sarah Hicks MRN: [id number] Date of Birth: 10-23-1960 Referring Provider (PT): Vickki Hearing MD   Encounter Date: 08/08/2018  PT End of Session - 08/08/18 0907    Visit Number  11    Number of Visits  12    Date for PT Re-Evaluation  08/04/18    PT Start Time  0858    PT Stop Time  0949    PT Time Calculation (min)  51 min    Activity Tolerance  Patient tolerated treatment well    Behavior During Therapy  [facility] for tasks assessed/performed       Past Medical History:  Diagnosis Date  . Arthritis    oa  . Complication of anesthesia    told to breathe twice after a morton's neuroma surgery 2003, did fine with other surgeries since  . GERD (gastroesophageal reflux disease)   . Headache   . History of kidney stones    passed on own  . Hypertension   . Medical history non-contributory   . Scoliosis   . Vitamin D deficiency 03/25/2016    Past Surgical History:  Procedure Laterality Date  . ANKLE SURGERY Right 10 year ago  . cervical cryoablation  1998  . CHOLECYSTECTOMY  17 years ago  . COLONOSCOPY N/A 09/13/2013   Procedure: COLONOSCOPY;  Surgeon: Rogene Houston, MD;  [facility];  Service: Endoscopy;  Laterality: N/A;  730-moved to [address] Ann to notify pt  . FOOT SURGERY     Morton's neuroma  . KNEE SURGERY Left 10 years ago   torn ligemant and tendon repair  . TOTAL KNEE ARTHROPLASTY Right 10/25/2017   Procedure: RIGHT TOTAL KNEE ARTHROPLASTY;  Surgeon: Gaynelle Arabian, MD;  [facility];  Service: Orthopedics;  Laterality: Right;  Adductor Block  . TUBAL LIGATION  19 years ago    There were no vitals filed for this visit.  Subjective Assessment - 08/08/18 0903    Subjective  Patient arrived with minimal discomfot     Pertinent History  Right total knee  replacement.    Limitations  Standing    How long can you stand comfortably?  5 minutes.    Patient Stated Goals  Walk without pain.    Currently in Pain?  Yes    Pain Score  1     Pain Orientation  Left    Pain Descriptors / Indicators  Discomfort    Pain Type  Chronic pain    Pain Onset  More than a month ago    Pain Frequency  Intermittent    Aggravating Factors   certain movements    Pain Relieving Factors  at rest                       [facility] Adult PT Treatment/Exercise - 08/08/18 0001      Electrical Stimulation   Electrical Stimulation Location  left achilles    Electrical Stimulation Action  premod    Electrical Stimulation Parameters  80-150hz  x38mn    Electrical Stimulation Goals  Pain      Ultrasound   Ultrasound Location  left achilles    Ultrasound Parameters  [place]1.5w/cm2/50%/12m x1065m     Vasopneumatic   Number Minutes Vasopneumatic   10 minutes    Vasopnuematic Location   Ankle  Vasopneumatic Pressure  Low      Manual Therapy   Manual Therapy  Soft tissue mobilization;Taping    Manual therapy comments  manual STW to achilles and calf muscle to reduce pain and tone    Kinesiotex  Inhibit Muscle      Kinesiotix   Inhibit Muscle   Achilles taping to inhibit muscle      Ankle Exercises: Aerobic   Stationary Bike  3mn L2                  PT Long Term Goals - 07/27/18 1008      PT LONG TERM GOAL #1   Title  I with advanced HEP    Baseline  Met 07/27/18    Time  6    Period  Weeks    Status  Achieved      PT LONG TERM GOAL #2   Title  Increase left ankle dorsiflexion to 10 degrees with knee in full extension to normalize the patient's gait pattern.    Baseline  Met 07/27/18    Period  Weeks    Status  Achieved      PT LONG TERM GOAL #3   Title  Stand 20 minutes with pain not > 2-3/10.    Baseline  Met 07/18/18    Time  6    Period  Weeks    Status  Achieved      PT LONG TERM GOAL #4   Title  Pain level upon  rising not > 2-3/10.    Baseline  Met 07/18/18    Time  6    Period  Weeks    Status  Achieved      PT LONG TERM GOAL #5   Title  Patient to perform ADLs with 2/10 pain or less in R knee.    Time  8    Period  Weeks    Status  Achieved            Plan - 08/08/18 0935    Clinical Impression Statement  Patient has met all current goals and is to have a F/U with MD    PT Frequency  2x / week    PT Duration  6 weeks    PT Treatment/Interventions  ADLs/Self Care Home Management;Cryotherapy;Electrical Stimulation;Iontophoresis 498mml Dexamethasone;Therapeutic exercise;Therapeutic activities;Patient/family education;Passive range of motion;Manual techniques;Dry needling;Vasopneumatic Device    PT Next Visit Plan  DC to HEP    Consulted and Agree with Plan of Care  Patient       Patient will benefit from skilled therapeutic intervention in order to improve the following deficits and impairments:  Pain, Decreased activity tolerance, Decreased range of motion  Visit Diagnosis: Pain in left foot  Stiffness of left ankle, not elsewhere classified  Stiffness of right knee, not elsewhere classified     Problem List Patient Active Problem List   Diagnosis Date Noted  . OA (osteoarthritis) of knee 10/25/2017  . Well woman exam with routine gynecological exam 06/16/2017  . Vitamin D deficiency 03/25/2016   ChLadean RayaPTA 08/08/18 9:50 AM  [facility] [place]: [phone] Fax:  [phone]: Sarah PAULSONRN: [id number] of Birth: 1110-10-1962PHYSICAL THERAPY DISCHARGE SUMMARY  Visits from Start of Care: 11  Current functional level related to goals / functional outcomes: See above.   Remaining deficits: All goals met.   Education / Equipment: HEP. Plan:  Patient agrees to discharge.  Patient goals were met. Patient is being discharged due to meeting the stated  rehab goals.  ?????         Mali Applegate MPT

## 2018-08-30 ENCOUNTER — Ambulatory Visit
Admission: RE | Admit: 2018-08-30 | Discharge: 2018-08-30 | Disposition: A | Discharge status: Home / Self Care | Payer: 59 | Source: Ambulatory Visit | Attending: Adult Health | Admitting: Adult Health

## 2018-08-30 DIAGNOSIS — Z1231 Encounter for screening mammogram for malignant neoplasm of breast: Secondary | ICD-10-CM

## 2018-09-05 ENCOUNTER — Other Ambulatory Visit: Payer: Self-pay

## 2018-09-05 ENCOUNTER — Encounter: Payer: Self-pay | Admitting: Adult Health

## 2018-09-05 ENCOUNTER — Ambulatory Visit (INDEPENDENT_AMBULATORY_CARE_PROVIDER_SITE_OTHER): Payer: 59 | Admitting: Adult Health

## 2018-09-05 VITALS — BP 125/77 | HR 75 | Ht 65.0 in | Wt 216.0 lb

## 2018-09-05 DIAGNOSIS — Z01419 Encounter for gynecological examination (general) (routine) without abnormal findings: Secondary | ICD-10-CM | POA: Diagnosis not present

## 2018-09-05 DIAGNOSIS — Z1211 Encounter for screening for malignant neoplasm of colon: Secondary | ICD-10-CM | POA: Diagnosis not present

## 2018-09-05 DIAGNOSIS — Z131 Encounter for screening for diabetes mellitus: Secondary | ICD-10-CM

## 2018-09-05 DIAGNOSIS — Z1212 Encounter for screening for malignant neoplasm of rectum: Secondary | ICD-10-CM

## 2018-09-05 DIAGNOSIS — E7800 Pure hypercholesterolemia, unspecified: Secondary | ICD-10-CM

## 2018-09-05 DIAGNOSIS — E559 Vitamin D deficiency, unspecified: Secondary | ICD-10-CM

## 2018-09-05 LAB — HEMOCCULT GUIAC POC 1CARD (OFFICE): Fecal Occult Blood, POC: NEGATIVE

## 2018-09-05 NOTE — Progress Notes (Signed)
Patient ID: Sarah Hicks, female   DOB: [birth year], 57 y.o.   MRN: [id number] History of Present Illness: Sarah Hicks is a 57 year old white female, married, in for well woman gyn, she had a normal pap with negative HPV 03/23/16. PCP is Dr Woody Seller.    Current Medications, Allergies, Past Medical History, Past Surgical History, Family History and Social History were reviewed in [employer] record.     Review of Systems: Patient denies any headaches, hearing loss, fatigue, blurred vision, shortness of breath, chest pain, abdominal pain, problems with bowel movements, urination, or intercourse. No joint pain or mood swings. Has fractured left little toe.    Physical Exam:BP 125/77 (BP Location: Right Arm, Patient Position: Sitting, Cuff Size: Normal)   Pulse 75   Ht 5\' 5"  (1.651 m)   Wt 216 lb (98 kg)   LMP 04/13/2013   BMI 35.94 kg/m  General:  Well developed, well nourished, no acute distress Skin:  Warm and dry Neck:  Midline trachea, normal thyroid, good ROM, no lymphadenopathy Lungs; Clear to auscultation bilaterally Breast:  No dominant palpable mass, retraction, or nipple discharge Cardiovascular: Regular rate and rhythm Abdomen:  Soft, non tender, no hepatosplenomegaly Pelvic:  External genitalia is normal in appearance, no lesions.  The vagina is normal in appearance. Urethra has no lesions or masses. The cervix is bulbous.  Uterus is felt to be normal size, shape, and contour.  No adnexal masses or tenderness noted.Bladder is non tender, no masses felt. Rectal: Good sphincter tone, no polyps, or hemorrhoids felt.  Hemoccult negative. Extremities/musculoskeletal:  No swelling or varicosities noted, no clubbing or cyanosis Psych:  No mood changes, alert and cooperative,seems happy PHQ  2 score 0. Fall risk low. Examination chaperoned by Shela Nevin RN.  Impression: 1. Well woman exam with routine gynecological exam   2. Screening for colorectal cancer    3. Elevated cholesterol   4. Screening for diabetes mellitus   5. Vitamin D deficiency       Plan: Check CBC,CMP,TSH and lipids,A1c and vitamin D Mammogram yearly Colonoscopy per GI Physical and pap next year

## 2018-09-06 ENCOUNTER — Telehealth: Payer: Self-pay | Admitting: Adult Health

## 2018-09-06 LAB — LIPID PANEL
CHOLESTEROL TOTAL: 187 mg/dL (ref 100–199)
Chol/HDL Ratio: 3.5 ratio (ref 0.0–4.4)
HDL: 53 mg/dL (ref >39)
LDL Calculated: 90 mg/dL (ref 0–99)
Triglycerides: 221 mg/dL — ABNORMAL HIGH (ref 0–149)
VLDL CHOLESTEROL CAL: 44 mg/dL — AB (ref 5–40)

## 2018-09-06 LAB — COMPREHENSIVE METABOLIC PANEL
ALBUMIN: 4.4 g/dL (ref 3.5–5.5)
ALK PHOS: 101 IU/L (ref 39–117)
ALT: 16 IU/L (ref 0–32)
AST: 16 IU/L (ref 0–40)
Albumin/Globulin Ratio: 1.8 (ref 1.2–2.2)
BUN / CREAT RATIO: 11 (ref 9–23)
BUN: 9 mg/dL (ref 6–24)
Bilirubin Total: 0.4 mg/dL (ref 0.0–1.2)
CHLORIDE: 102 mmol/L (ref 96–106)
CO2: 24 mmol/L (ref 20–29)
Calcium: 9.7 mg/dL (ref 8.7–10.2)
Creatinine, Ser: 0.83 mg/dL (ref 0.57–1.00)
GFR calc Af Amer: 91 mL/min/{1.73_m2} (ref >59)
GFR calc non Af Amer: 79 mL/min/{1.73_m2} (ref >59)
GLOBULIN, TOTAL: 2.4 g/dL (ref 1.5–4.5)
Glucose: 86 mg/dL (ref 65–99)
Potassium: 5.1 mmol/L (ref 3.5–5.2)
SODIUM: 142 mmol/L (ref 134–144)
Total Protein: 6.8 g/dL (ref 6.0–8.5)

## 2018-09-06 LAB — CBC
HEMATOCRIT: 40.5 % (ref 34.0–46.6)
HEMOGLOBIN: 13.3 g/dL (ref 11.1–15.9)
MCH: 28.7 pg (ref 26.6–33.0)
MCHC: 32.8 g/dL (ref 31.5–35.7)
MCV: 87 fL (ref 79–97)
Platelets: 303 10*3/uL (ref 150–450)
RBC: 4.64 x10E6/uL (ref 3.77–5.28)
RDW: 13 % (ref 12.3–15.4)
WBC: 8.8 10*3/uL (ref 3.4–10.8)

## 2018-09-06 LAB — HEMOGLOBIN A1C
ESTIMATED AVERAGE GLUCOSE: 105 mg/dL
HEMOGLOBIN A1C: 5.3 % (ref 4.8–5.6)

## 2018-09-06 LAB — TSH: TSH: 3.2 u[IU]/mL (ref 0.450–4.500)

## 2018-09-06 LAB — VITAMIN D 25 HYDROXY (VIT D DEFICIENCY, FRACTURES): Vit D, 25-Hydroxy: 28.4 ng/mL — ABNORMAL LOW (ref 30.0–100.0)

## 2018-09-06 MED ORDER — CHOLECALCIFEROL 125 MCG (5000 UT) PO CAPS: 5000.0000 [IU] | ORAL_CAPSULE | Freq: Every day | ORAL | Status: AC

## 2018-09-06 NOTE — Telephone Encounter (Signed)
Left message to me about labs

## 2018-09-06 NOTE — Telephone Encounter (Signed)
Pt aware of labs, keep doing what you are doing, but increase Vitamin D to 5000 IU every day, since has 3000 IU can take 2 for now, will mail her copy of labs

## 2018-09-21 ENCOUNTER — Ambulatory Visit: Payer: 59 | Admitting: Physical Therapy

## 2018-11-09 ENCOUNTER — Telehealth: Payer: Self-pay | Admitting: Adult Health

## 2018-11-09 MED ORDER — FLUCONAZOLE 150 MG PO TABS: ORAL_TABLET | ORAL | 1 refills | Status: DC

## 2018-11-09 NOTE — Telephone Encounter (Signed)
Patient called stating that she went to the dentist and they gave her an antibiotic for her not to get an infection and she has gotten a yeast infection. Pt would like Sarah Hicks to call her in something for her yeast infection to [facility] in [place].

## 2018-11-09 NOTE — Telephone Encounter (Signed)
Taking antibiotics for teeth, has vaginal itching, will rx diflucan, tried monistat which burned.

## 2018-12-04 IMAGING — MG DIGITAL SCREENING BILATERAL MAMMOGRAM WITH TOMO AND CAD
8 series · 8 of 24 positions shown · non-contrast
Comparison: Previous exam(s).

CLINICAL DATA: Screening.

EXAM:
DIGITAL SCREENING BILATERAL MAMMOGRAM WITH TOMO AND CAD

[R MLO synth-2D]
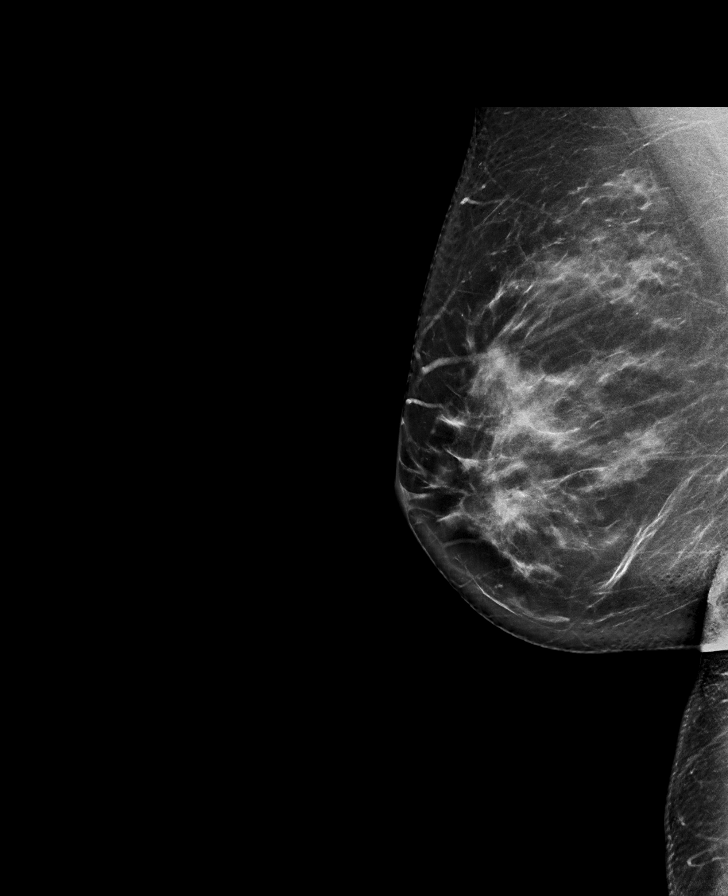

[L CC synth-2D]
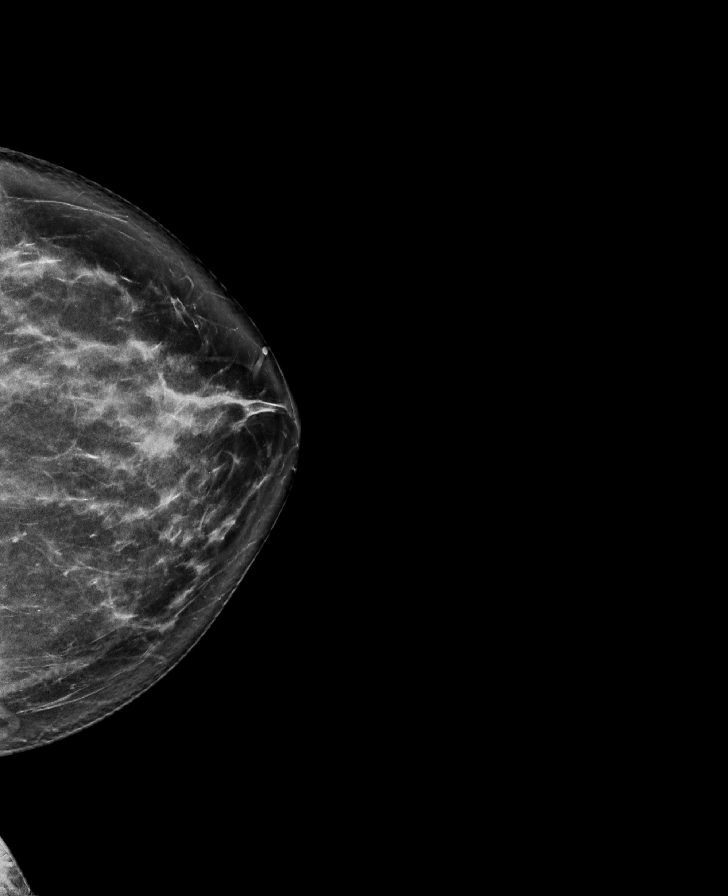

[R CC synth-2D]
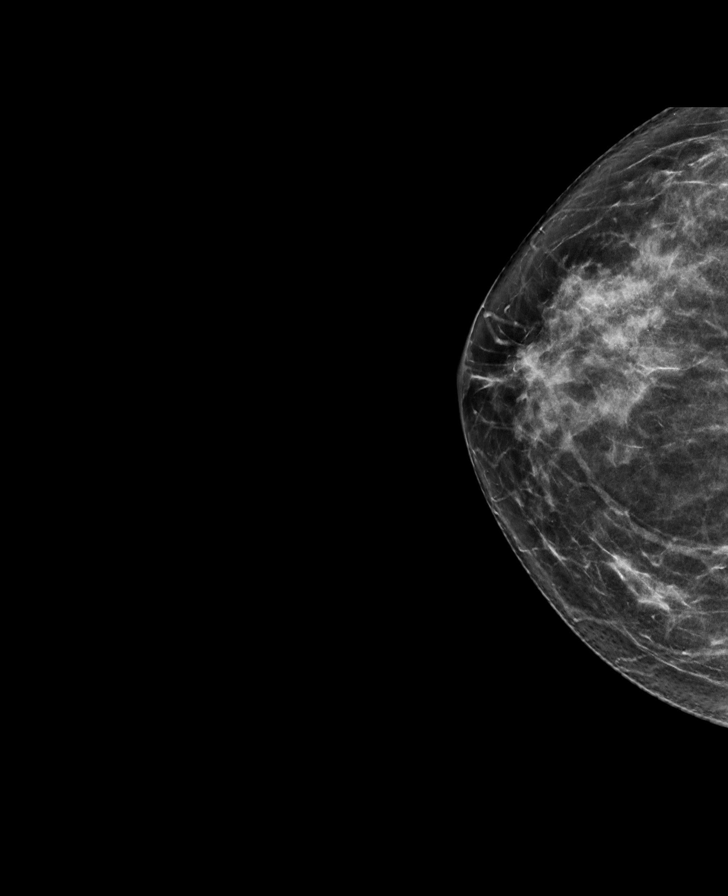

[L MLO synth-2D]
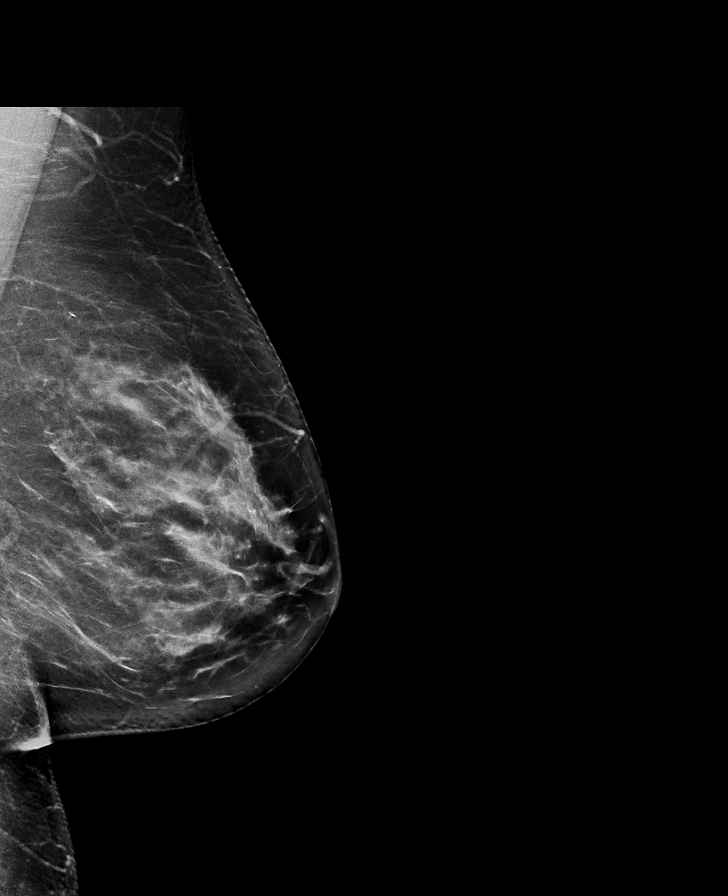

[L MLO tomo · tomo slice 45/90.0]
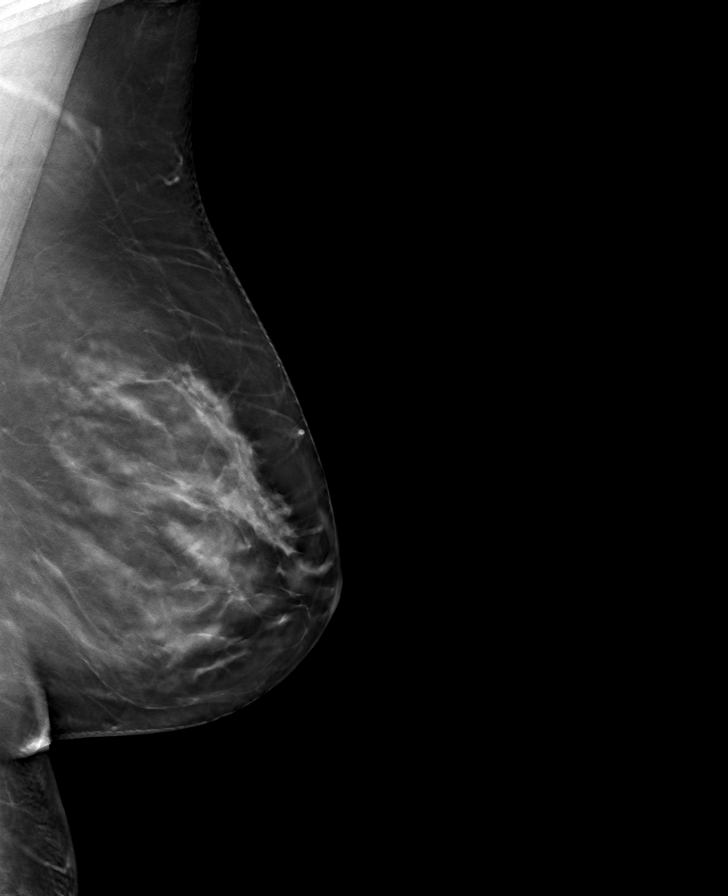

[L CC tomo · tomo slice 42/83.0]
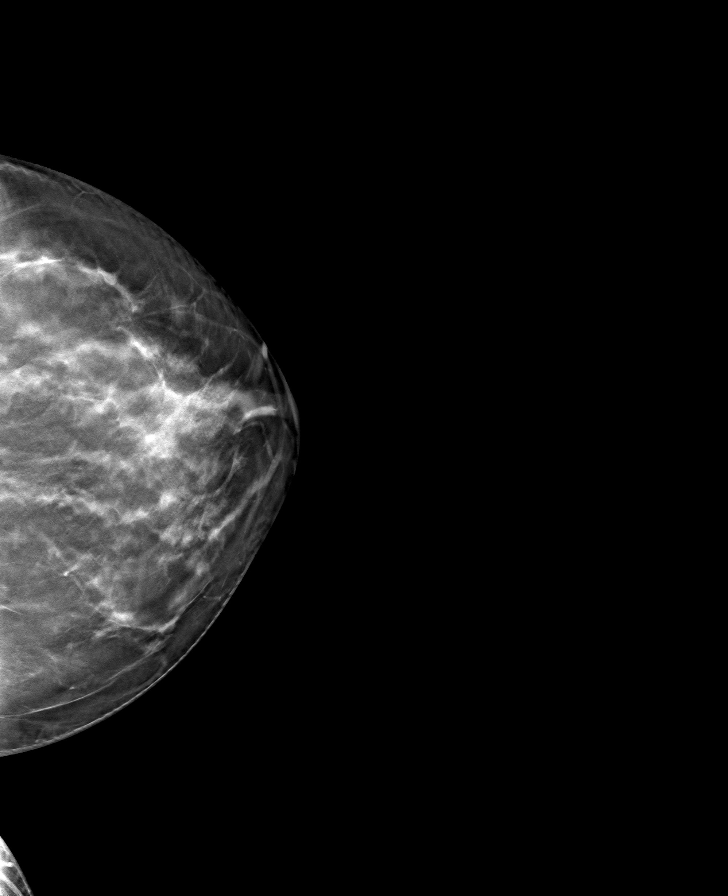

[R CC tomo · tomo slice 37/73.0]
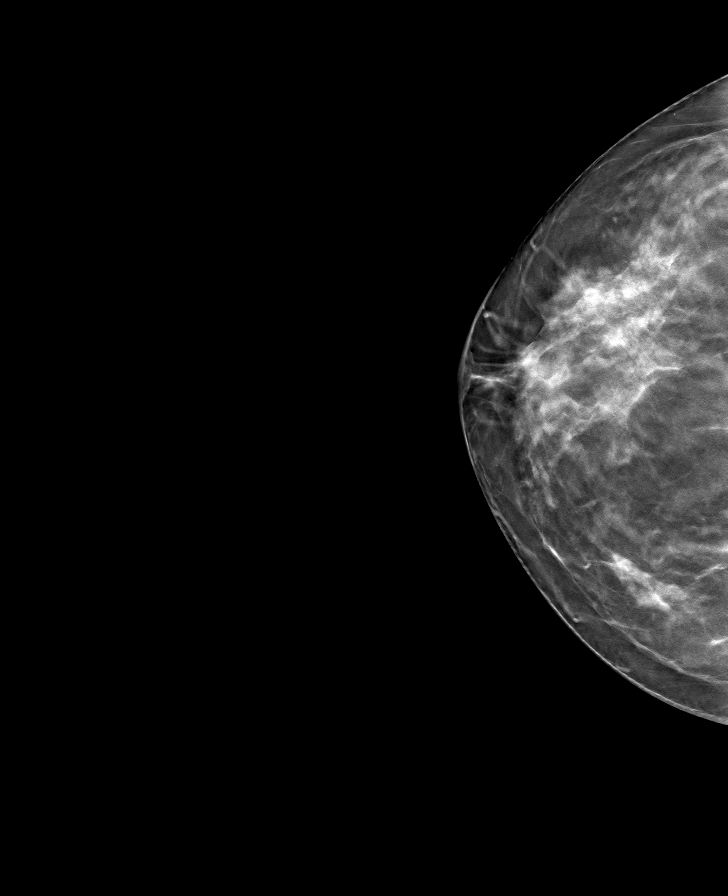

[R MLO tomo · tomo slice 46/91.0]
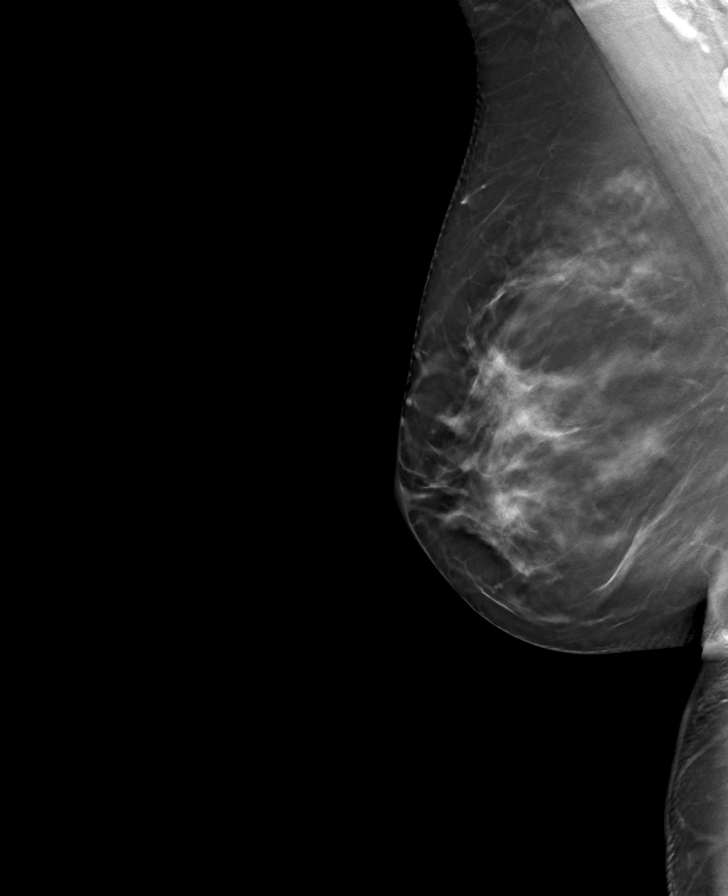

[8 of 24 positions shown; findings below may reference images not displayed]

ACR Breast Density Category c: The breast tissue is heterogeneously
dense, which may obscure small masses.
FINDINGS: There are no findings suspicious for malignancy. Images were
processed with CAD.
IMPRESSION: No mammographic evidence of malignancy. A result letter of this
screening mammogram will be mailed directly to the patient.

RECOMMENDATION:
Screening mammogram in one year. (Code:[id number]-[id number])

BI-RADS CATEGORY  1: Negative.

## 2019-03-09 ENCOUNTER — Telehealth: Payer: Self-pay | Admitting: Adult Health

## 2019-03-09 NOTE — Telephone Encounter (Signed)
Left message @ 5:06 pm. JSY

## 2019-03-09 NOTE — Telephone Encounter (Signed)
Pt requesting medication for a yeast infection. She has been on a antibiotic for a tick bite.

## 2019-03-10 MED ORDER — FLUCONAZOLE 150 MG PO TABS: ORAL_TABLET | ORAL | 1 refills | Status: DC

## 2019-03-10 NOTE — Telephone Encounter (Signed)
Has been on antibiotic for a tick bite. Pt is requesting Diflucan for yeast. Can you prescribe? Thanks!! Butler

## 2019-03-10 NOTE — Telephone Encounter (Signed)
Patient returned Janet's call, per Marcie Bal, she'll call her back.  [place] in [place]

## 2019-03-10 NOTE — Addendum Note (Signed)
Addended by: Derrek Monaco A on: 03/10/2019 12:48 PM   Modules accepted: Orders

## 2019-03-10 NOTE — Telephone Encounter (Signed)
Refilled diflucan

## 2019-08-29 ENCOUNTER — Other Ambulatory Visit: Payer: Self-pay | Admitting: Adult Health

## 2019-08-29 DIAGNOSIS — Z1231 Encounter for screening mammogram for malignant neoplasm of breast: Secondary | ICD-10-CM

## 2019-08-30 ENCOUNTER — Other Ambulatory Visit: Payer: Self-pay | Admitting: Urology

## 2019-08-31 ENCOUNTER — Other Ambulatory Visit (HOSPITAL_COMMUNITY): Payer: Self-pay | Admitting: Urology

## 2019-08-31 ENCOUNTER — Other Ambulatory Visit: Payer: Self-pay | Admitting: Urology

## 2019-08-31 DIAGNOSIS — N200 Calculus of kidney: Secondary | ICD-10-CM

## 2019-09-04 ENCOUNTER — Other Ambulatory Visit: Payer: Self-pay

## 2019-09-04 ENCOUNTER — Ambulatory Visit
Admission: RE | Admit: 2019-09-04 | Discharge: 2019-09-04 | Disposition: A | Discharge status: Home / Self Care | Payer: 59 | Source: Ambulatory Visit | Attending: Adult Health | Admitting: Adult Health

## 2019-09-04 DIAGNOSIS — Z1231 Encounter for screening mammogram for malignant neoplasm of breast: Secondary | ICD-10-CM

## 2019-09-13 NOTE — Patient Instructions (Addendum)
DUE TO COVID-19 ONLY ONE VISITOR IS ALLOWED TO COME WITH YOU AND STAY IN THE WAITING ROOM ONLY DURING PRE OP AND PROCEDURE DAY OF SURGERY. THE 1 VISITOR MAY VISIT WITH YOU AFTER SURGERY IN YOUR PRIVATE ROOM DURING VISITING HOURS ONLY!  YOU NEED TO HAVE A COVID 19 TEST ON_Friday 12/04/2020______ @__150  pm _____, THIS TEST MUST BE DONE BEFORE SURGERY, COME  [address], [address] [place] , [id number].  ([place]) ONCE YOUR COVID TEST IS COMPLETED, PLEASE BEGIN THE QUARANTINE INSTRUCTIONS AS OUTLINED IN YOUR HANDOUT.                YANIN TEPEDINO    Your procedure is scheduled on: Tuesday 09/19/2019   Report to [facility] Main  Entrance    Report to Radiology at  0800 AM     Call this number if you have problems the morning of surgery [phone]    Remember: Do not eat food or drink liquids :After Midnight.    BRUSH YOUR TEETH MORNING OF SURGERY AND RINSE YOUR MOUTH OUT, NO CHEWING GUM CANDY OR MINTS.     Take these medicines the morning of surgery with A SIP OF WATER: Loratadine (Claritin) if needed                                 You may not have any metal on your body including hair pins and              piercings  Do not wear jewelry, make-up, lotions, powders or perfumes, deodorant             Do not wear nail polish on your fingernails.  Do not shave  48 hours prior to surgery.                 Do not bring valuables to the hospital. [place].  Contacts, dentures or bridgework may not be worn into surgery.  Leave suitcase in the car. After surgery it may be brought to your room.                  Please read over the following fact sheets you were given: _____________________________________________________________________             [facility] - Preparing for Surgery Before surgery, you can play an important role.  Because skin is not sterile, your skin needs to be as free of germs as  possible.  You can reduce the number of germs on your skin by washing with CHG (chlorahexidine gluconate) soap before surgery.  CHG is an antiseptic cleaner which kills germs and bonds with the skin to continue killing germs even after washing. Please DO NOT use if you have an allergy to CHG or antibacterial soaps.  If your skin becomes reddened/irritated stop using the CHG and inform your nurse when you arrive at Short Stay. Do not shave (including legs and underarms) for at least 48 hours prior to the first CHG shower.  You may shave your face/neck. Please follow these instructions carefully:  1.  Shower with CHG Soap the night before surgery and the  morning of Surgery.  2.  If you choose to wash your hair, wash your hair first as usual with your  normal  shampoo.  3.  After you shampoo, rinse your hair and body thoroughly to remove the  shampoo.                           4.  Use CHG as you would any other liquid soap.  You can apply chg directly  to the skin and wash                       Gently with a scrungie or clean washcloth.  5.  Apply the CHG Soap to your body ONLY FROM THE NECK DOWN.   Do not use on face/ open                           Wound or open sores. Avoid contact with eyes, ears mouth and genitals (private parts).                       Wash face,  Genitals (private parts) with your normal soap.             6.  Wash thoroughly, paying special attention to the area where your surgery  will be performed.  7.  Thoroughly rinse your body with warm water from the neck down.  8.  DO NOT shower/wash with your normal soap after using and rinsing off  the CHG Soap.                9.  Pat yourself dry with a clean towel.            10.  Wear clean pajamas.            11.  Place clean sheets on your bed the night of your first shower and do not  sleep with pets. Day of Surgery : Do not apply any lotions/deodorants the morning of surgery.  Please wear clean clothes to the hospital/surgery  center.  FAILURE TO FOLLOW THESE INSTRUCTIONS MAY RESULT IN THE CANCELLATION OF YOUR SURGERY PATIENT SIGNATURE_________________________________  NURSE SIGNATURE__________________________________  ________________________________________________________________________  WHAT IS A BLOOD TRANSFUSION? Blood Transfusion Information  A transfusion is the replacement of blood or some of its parts. Blood is made up of multiple cells which provide different functions.  Red blood cells carry oxygen and are used for blood loss replacement.  White blood cells fight against infection.  Platelets control bleeding.  Plasma helps clot blood.  Other blood products are available for specialized needs, such as hemophilia or other clotting disorders. BEFORE THE TRANSFUSION  Who gives blood for transfusions?   Healthy volunteers who are fully evaluated to make sure their blood is safe. This is blood bank blood. Transfusion therapy is the safest it has ever been in the practice of medicine. Before blood is taken from a donor, a complete history is taken to make sure that person has no history of diseases nor engages in risky social behavior (examples are intravenous drug use or sexual activity with multiple partners). The donor's travel history is screened to minimize risk of transmitting infections, such as malaria. The donated blood is tested for signs of infectious diseases, such as HIV and hepatitis. The blood is then tested to be sure it is compatible with you in order to minimize the chance of a transfusion reaction. If you or a relative donates blood, this is often done in anticipation of surgery and is not appropriate for emergency situations.  It takes many days to process the donated blood. RISKS AND COMPLICATIONS Although transfusion therapy is very safe and saves many lives, the main dangers of transfusion include:   Getting an infectious disease.  Developing a transfusion reaction. This is an  allergic reaction to something in the blood you were given. Every precaution is taken to prevent this. The decision to have a blood transfusion has been considered carefully by your caregiver before blood is given. Blood is not given unless the benefits outweigh the risks. AFTER THE TRANSFUSION  Right after receiving a blood transfusion, you will usually feel much better and more energetic. This is especially true if your red blood cells have gotten low (anemic). The transfusion raises the level of the red blood cells which carry oxygen, and this usually causes an energy increase.  The nurse administering the transfusion will monitor you carefully for complications. HOME CARE INSTRUCTIONS  No special instructions are needed after a transfusion. You may find your energy is better. Speak with your caregiver about any limitations on activity for underlying diseases you may have. SEEK MEDICAL CARE IF:   Your condition is not improving after your transfusion.  You develop redness or irritation at the intravenous (IV) site. SEEK IMMEDIATE MEDICAL CARE IF:  Any of the following symptoms occur over the next 12 hours:  Shaking chills.  You have a temperature by mouth above 102 F (38.9 C), not controlled by medicine.  Chest, back, or muscle pain.  People around you feel you are not acting correctly or are confused.  Shortness of breath or difficulty breathing.  Dizziness and fainting.  You get a rash or develop hives.  You have a decrease in urine output.  Your urine turns a dark color or changes to pink, red, or brown. Any of the following symptoms occur over the next 10 days:  You have a temperature by mouth above 102 F (38.9 C), not controlled by medicine.  Shortness of breath.  Weakness after normal activity.  The white part of the eye turns yellow (jaundice).  You have a decrease in the amount of urine or are urinating less often.  Your urine turns a dark color or changes  to pink, red, or brown. Document Released: 09/25/2000 Document Revised: 12/21/2011 Document Reviewed: 05/14/2008 [facility] Patient Information 2014 [place], [place].  _______________________________________________________________________

## 2019-09-15 ENCOUNTER — Other Ambulatory Visit: Payer: Self-pay

## 2019-09-15 ENCOUNTER — Encounter (HOSPITAL_COMMUNITY): Payer: Self-pay

## 2019-09-15 ENCOUNTER — Encounter (HOSPITAL_COMMUNITY)
Admission: RE | Admit: 2019-09-15 | Discharge: 2019-09-15 | Disposition: A | Discharge status: Home / Self Care | Payer: 59 | Source: Ambulatory Visit | Attending: Urology | Admitting: Urology

## 2019-09-15 ENCOUNTER — Other Ambulatory Visit (HOSPITAL_COMMUNITY)
Admission: RE | Admit: 2019-09-15 | Discharge: 2019-09-15 | Disposition: A | Discharge status: Home / Self Care | Payer: 59 | Source: Ambulatory Visit | Attending: Urology | Admitting: Urology

## 2019-09-15 DIAGNOSIS — Z20828 Contact with and (suspected) exposure to other viral communicable diseases: Secondary | ICD-10-CM | POA: Insufficient documentation

## 2019-09-15 DIAGNOSIS — Z01810 Encounter for preprocedural cardiovascular examination: Secondary | ICD-10-CM | POA: Insufficient documentation

## 2019-09-15 DIAGNOSIS — I10 Essential (primary) hypertension: Secondary | ICD-10-CM | POA: Diagnosis present

## 2019-09-15 DIAGNOSIS — Z01812 Encounter for preprocedural laboratory examination: Secondary | ICD-10-CM | POA: Diagnosis not present

## 2019-09-15 LAB — CBC
HCT: 44.4 % (ref 36.0–46.0)
Hemoglobin: 14 g/dL (ref 12.0–15.0)
MCH: 29 pg (ref 26.0–34.0)
MCHC: 31.5 g/dL (ref 30.0–36.0)
MCV: 92.1 fL (ref 80.0–100.0)
Platelets: 273 10*3/uL (ref 150–400)
RBC: 4.82 MIL/uL (ref 3.87–5.11)
RDW: 13.2 % (ref 11.5–15.5)
WBC: 8.9 10*3/uL (ref 4.0–10.5)
nRBC: 0 % (ref 0.0–0.2)

## 2019-09-15 NOTE — Progress Notes (Signed)
   09/15/19 0836  OBSTRUCTIVE SLEEP APNEA  Have you ever been diagnosed with sleep apnea through a sleep study? No  Do you snore loudly (loud enough to be heard through closed doors)?  1  Do you often feel tired, fatigued, or sleepy during the daytime (such as falling asleep during driving or talking to someone)? 0  Has anyone observed you stop breathing during your sleep? 1  Do you have, or are you being treated for high blood pressure? 0  BMI more than 35 kg/m2? 1  Age > 50 (1-yes) 1  Neck circumference greater than:Female 16 inches or larger, Female 17inches or larger? 1  Female Gender (Yes=1) 0  Obstructive Sleep Apnea Score 5  Score 5 or greater  Results sent to PCP

## 2019-09-15 NOTE — Progress Notes (Signed)
PCP - Dr. Hillis Range, [place]  09/14/2019- labs on chart- CMP,U/A, Lipid panel, TSh from Dr. Woody Seller Cardiologist - N/A  Chest x-ray -N/A  EKG - 09/15/2019 Stress Test - N/A ECHO - N/A Cardiac Cath -   Sleep Study - N/A CPAP - N/A  Fasting Blood Sugar - N/A Checks Blood Sugar __0___ times a day  Blood Thinner Instructions:N/A Aspirin Instructions:N/A Last Dose:N/A  Anesthesia review:   Patient has a history of arthritis, HTN and kidney stones.  Patient denies shortness of breath, fever, cough and chest pain at PAT appointment   Patient verbalized understanding of instructions that were given to them at the PAT appointment. Patient was also instructed that they will need to review over the PAT instructions again at home before surgery.

## 2019-09-16 LAB — NOVEL CORONAVIRUS, NAA (HOSP ORDER, SEND-OUT TO REF LAB; TAT 18-24 HRS): SARS-CoV-2, NAA: NOT DETECTED

## 2019-09-18 ENCOUNTER — Other Ambulatory Visit: Payer: Self-pay | Admitting: Physician Assistant

## 2019-09-18 NOTE — H&P (Signed)
CC: I have kidney stones.  HPI: Sarah Hicks is a 58 year-old female established patient who is here for renal calculi.  The problem is on the left side.   Sarah Hicks returns today to discuss management of her 2cm left renal pelvic stone found on CT for gross hematuria about 2 months ago. The stone is 1000-1200 HU. She had a negative urine culture. She has had no left flank pain but has had some right flank pain that is positional and is likely musculoskeletal based on her lumbar DDD with scoliosis on the CT. She has had no gross hematuria. She has no significant voiding complaints. She saw Dr. Gloriann Loan on 11/12 and the options were reviewed, but she returns to see me because I have taken care of one of her close friends with stones for many years.      ALLERGIES: Latex    MEDICATIONS: Coq10  Ibuprofen  Multivitamin  Probiotic  Tylenol  Vitamin D3     GU PSH: None   NON-GU PSH: Ankle Arthroscopy/surgery, Right Bilateral Tubal Ligation Cholecystectomy (laparoscopic) Foot surgery (unspecified), Right Knee replacement Leg surgery (unspecified), Left     GU PMH: Gross hematuria - 08/24/2019 RUQ pain - 08/24/2019 Renal calculus    NON-GU PMH: Arthritis GERD Sleep Apnea    FAMILY HISTORY: 1 Daughter - Other 1 son - Other Arthritis - Runs in Family Asthma - Runs in Family Hypertension - Runs in Family Lung Cancer - Father nephrolithiasis - Runs in Family Prostate Cancer - Runs in Family stroke - Grandmother   SOCIAL HISTORY: Marital Status: Married Preferred Language: English; Race: White Current Smoking Status: Patient has never smoked.   Tobacco Use Assessment Completed: Used Tobacco in last 30 days? Drinks 1 caffeinated drink per day.    REVIEW OF SYSTEMS:    GU Review Female:   Patient denies frequent urination, hard to postpone urination, burning /pain with urination, get up at night to urinate, leakage of urine, stream starts and stops, trouble starting your stream,  have to strain to urinate, and being pregnant.  Gastrointestinal (Upper):   Patient denies nausea, vomiting, and indigestion/ heartburn.  Gastrointestinal (Lower):   Patient denies diarrhea and constipation.  Constitutional:   Patient denies fever, night sweats, weight loss, and fatigue.  Skin:   Patient denies skin rash/ lesion and itching.  Eyes:   Patient denies blurred vision and double vision.  Ears/ Nose/ Throat:   Patient denies sore throat and sinus problems.  Hematologic/Lymphatic:   Patient denies swollen glands and easy bruising.  Cardiovascular:   Patient denies leg swelling and chest pains.  Respiratory:   Patient denies cough and shortness of breath.  Endocrine:   Patient denies excessive thirst.  Musculoskeletal:   Patient denies back pain and joint pain.  Neurological:   Patient denies headaches and dizziness.  Psychologic:   Patient denies anxiety and depression.   VITAL SIGNS:      08/30/2019 08:53 AM  BP 155/91 mmHg  Pulse 98 /min  Temperature 98.0 F / 36.6 C   MULTI-SYSTEM PHYSICAL EXAMINATION:    Constitutional: Well-nourished. No physical deformities. Normally developed. Good grooming.   Respiratory: Normal breath sounds. No labored breathing, no use of accessory muscles.   Cardiovascular: Regular rate and rhythm. No murmur, no gallop.      PAST DATA REVIEWED:  Source Of History:  Patient  Urine Test Review:   Urinalysis, Urine Culture  X-Ray Review: KUB: Reviewed Films. Reviewed Report. Discussed With Patient.  C.T. Abdomen/Pelvis:  Reviewed Films. Reviewed Report. Discussed With Patient.     PROCEDURES:          Urinalysis w/Scope Dipstick Dipstick Cont'd Micro  Color: Yellow Bilirubin: Neg mg/dL WBC/hpf: 6 - 10/hpf  Appearance: Cloudy Ketones: Neg mg/dL RBC/hpf: 40 - 60/hpf  Specific Gravity: 1.020 Blood: 3+ ery/uL Bacteria: Rare (0-9/hpf)  pH: 6.0 Protein: 3+ mg/dL Cystals: NS (Not Seen)  Glucose: Trace mg/dL Urobilinogen: 0.2 mg/dL Casts: NS (Not  Seen)    Nitrites: Neg Trichomonas: Not Present    Leukocyte Esterase: Neg leu/uL Mucous: Not Present      Epithelial Cells: 6 - 10/hpf      Yeast: NS (Not Seen)      Sperm: Not Present    ASSESSMENT:      ICD-10 Details  1 GU:   Renal calculus - N20.0 Left, She has a 2cm left renal pelvic stone with hematuria. I discussed the options again in detail including PCNL which has the highest probabiity of rendering her stone free, ESWL which is the least invasive option and URS. She has been reviewing the options online and is most interested in URS, which I think is reasonable as long as she understands the need for a stent and possible multiple procedures with a lower stone free rate than PCNL. She would like to proceed with URS. I have reviewed the risks of ureteroscopy including bleeding, infection, ureteral injury, need for a stent or secondary procedures, thrombotic events and anesthetic complications.    PLAN:           Schedule Return Visit/Planned Activity: Next Available Appointment - Schedule Surgery          Document Letter(s):  Created for Patient: Clinical Summary

## 2019-09-19 ENCOUNTER — Observation Stay (HOSPITAL_COMMUNITY): Payer: 59

## 2019-09-19 ENCOUNTER — Encounter (HOSPITAL_COMMUNITY): Payer: Self-pay

## 2019-09-19 ENCOUNTER — Observation Stay (HOSPITAL_COMMUNITY)
Admission: RE | Admit: 2019-09-19 | Discharge: 2019-09-20 | Disposition: A | Discharge status: Home / Self Care | Payer: 59 | Attending: Urology | Admitting: Urology

## 2019-09-19 ENCOUNTER — Ambulatory Visit (HOSPITAL_COMMUNITY): Payer: 59 | Admitting: Registered Nurse

## 2019-09-19 ENCOUNTER — Other Ambulatory Visit: Payer: Self-pay

## 2019-09-19 ENCOUNTER — Ambulatory Visit (HOSPITAL_COMMUNITY)
Admission: RE | Admit: 2019-09-19 | Discharge: 2019-09-19 | Disposition: A | Discharge status: Home / Self Care | Payer: 59 | Source: Ambulatory Visit | Attending: Urology | Admitting: Urology

## 2019-09-19 ENCOUNTER — Ambulatory Visit (HOSPITAL_COMMUNITY): Payer: 59 | Admitting: Physician Assistant

## 2019-09-19 ENCOUNTER — Encounter (HOSPITAL_COMMUNITY)
Admission: RE | Disposition: A | Discharge status: Home / Self Care | Payer: Self-pay | Source: Home / Self Care | Attending: Urology

## 2019-09-19 DIAGNOSIS — Z79899 Other long term (current) drug therapy: Secondary | ICD-10-CM | POA: Diagnosis not present

## 2019-09-19 DIAGNOSIS — N200 Calculus of kidney: Secondary | ICD-10-CM

## 2019-09-19 DIAGNOSIS — Z96659 Presence of unspecified artificial knee joint: Secondary | ICD-10-CM | POA: Diagnosis not present

## 2019-09-19 DIAGNOSIS — N132 Hydronephrosis with renal and ureteral calculous obstruction: Principal | ICD-10-CM | POA: Insufficient documentation

## 2019-09-19 DIAGNOSIS — K219 Gastro-esophageal reflux disease without esophagitis: Secondary | ICD-10-CM | POA: Insufficient documentation

## 2019-09-19 DIAGNOSIS — M1990 Unspecified osteoarthritis, unspecified site: Secondary | ICD-10-CM | POA: Insufficient documentation

## 2019-09-19 DIAGNOSIS — G4730 Sleep apnea, unspecified: Secondary | ICD-10-CM | POA: Insufficient documentation

## 2019-09-19 DIAGNOSIS — M419 Scoliosis, unspecified: Secondary | ICD-10-CM | POA: Insufficient documentation

## 2019-09-19 DIAGNOSIS — Z87442 Personal history of urinary calculi: Secondary | ICD-10-CM | POA: Insufficient documentation

## 2019-09-19 DIAGNOSIS — Z91040 Latex allergy status: Secondary | ICD-10-CM | POA: Insufficient documentation

## 2019-09-19 HISTORY — PX: IR URETERAL STENT LEFT NEW ACCESS W/O SEP NEPHROSTOMY CATH: IMG6075

## 2019-09-19 HISTORY — PX: NEPHROLITHOTOMY: SHX5134

## 2019-09-19 LAB — HEMOGLOBIN AND HEMATOCRIT, BLOOD
HCT: 42.4 % (ref 36.0–46.0)
Hemoglobin: 13.3 g/dL (ref 12.0–15.0)

## 2019-09-19 LAB — PROTIME-INR
INR: 0.9 (ref 0.8–1.2)
Prothrombin Time: 11.6 seconds (ref 11.4–15.2)

## 2019-09-19 LAB — TYPE AND SCREEN
ABO/RH(D): A POS
Antibody Screen: NEGATIVE

## 2019-09-19 LAB — HIV ANTIBODY (ROUTINE TESTING W REFLEX): HIV Screen 4th Generation wRfx: NONREACTIVE

## 2019-09-19 SURGERY — NEPHROLITHOTOMY PERCUTANEOUS
Anesthesia: General | Laterality: Left

## 2019-09-19 MED ORDER — 0.9 % SODIUM CHLORIDE (POUR BTL) OPTIME
TOPICAL | Status: DC | PRN
  Administered 2019-09-19: 1000 mL

## 2019-09-19 MED ORDER — FENTANYL CITRATE (PF) 100 MCG/2ML IJ SOLN
INTRAMUSCULAR | Status: AC | PRN
  Administered 2019-09-19 (×4): 50 ug via INTRAVENOUS

## 2019-09-19 MED ORDER — FLEET ENEMA 7-19 GM/118ML RE ENEM: 1.0000 | ENEMA | Freq: Once | RECTAL | Status: DC | PRN

## 2019-09-19 MED ORDER — LIDOCAINE HCL 1 % IJ SOLN
INTRAMUSCULAR | Status: AC
  Filled 2019-09-19: qty 20

## 2019-09-19 MED ORDER — MEPERIDINE HCL 50 MG/ML IJ SOLN: 6.2500 mg | INTRAMUSCULAR | Status: DC | PRN

## 2019-09-19 MED ORDER — SODIUM CHLORIDE 0.9 % IR SOLN
Status: DC | PRN
  Administered 2019-09-19: 12000 mL

## 2019-09-19 MED ORDER — HYDROMORPHONE HCL 1 MG/ML IJ SOLN
INTRAMUSCULAR | Status: AC
  Filled 2019-09-19: qty 1

## 2019-09-19 MED ORDER — FENTANYL CITRATE (PF) 250 MCG/5ML IJ SOLN
INTRAMUSCULAR | Status: AC
  Filled 2019-09-19: qty 5

## 2019-09-19 MED ORDER — DEXAMETHASONE SODIUM PHOSPHATE 10 MG/ML IJ SOLN
INTRAMUSCULAR | Status: DC | PRN
  Administered 2019-09-19: 10 mg via INTRAVENOUS

## 2019-09-19 MED ORDER — HYDROMORPHONE HCL 1 MG/ML IJ SOLN
0.5000 mg | INTRAMUSCULAR | Status: DC | PRN
  Administered 2019-09-19 – 2019-09-20 (×2): 0.5 mg via INTRAVENOUS
  Filled 2019-09-19 (×2): qty 1

## 2019-09-19 MED ORDER — MIDAZOLAM HCL 5 MG/5ML IJ SOLN
INTRAMUSCULAR | Status: DC | PRN
  Administered 2019-09-19: 2 mg via INTRAVENOUS

## 2019-09-19 MED ORDER — LIDOCAINE HCL (PF) 1 % IJ SOLN
INTRAMUSCULAR | Status: AC | PRN
  Administered 2019-09-19 (×3): 10 mL

## 2019-09-19 MED ORDER — PROMETHAZINE HCL 25 MG/ML IJ SOLN: 6.2500 mg | INTRAMUSCULAR | Status: DC | PRN

## 2019-09-19 MED ORDER — CEFAZOLIN SODIUM-DEXTROSE 1-4 GM/50ML-% IV SOLN
1.0000 g | Freq: Three times a day (TID) | INTRAVENOUS | Status: DC
  Administered 2019-09-19 – 2019-09-20 (×2): 1 g via INTRAVENOUS
  Filled 2019-09-19 (×4): qty 50

## 2019-09-19 MED ORDER — PROPOFOL 10 MG/ML IV BOLUS
INTRAVENOUS | Status: DC | PRN
  Administered 2019-09-19: 150 mg via INTRAVENOUS

## 2019-09-19 MED ORDER — CEFAZOLIN SODIUM-DEXTROSE 2-4 GM/100ML-% IV SOLN
INTRAVENOUS | Status: AC
  Administered 2019-09-19: 2000 mg
  Filled 2019-09-19: qty 100

## 2019-09-19 MED ORDER — MIDAZOLAM HCL 2 MG/2ML IJ SOLN
INTRAMUSCULAR | Status: AC
  Filled 2019-09-19: qty 4

## 2019-09-19 MED ORDER — CIPROFLOXACIN IN D5W 400 MG/200ML IV SOLN
400.0000 mg | Freq: Once | INTRAVENOUS | Status: AC
  Administered 2019-09-19: 400 mg via INTRAVENOUS
  Filled 2019-09-19: qty 200

## 2019-09-19 MED ORDER — ROCURONIUM BROMIDE 10 MG/ML (PF) SYRINGE
PREFILLED_SYRINGE | INTRAVENOUS | Status: DC | PRN
  Administered 2019-09-19: 100 mg via INTRAVENOUS

## 2019-09-19 MED ORDER — BISACODYL 10 MG RE SUPP: 10.0000 mg | Freq: Every day | RECTAL | Status: DC | PRN

## 2019-09-19 MED ORDER — OXYCODONE HCL 5 MG/5ML PO SOLN: 5.0000 mg | Freq: Once | ORAL | Status: DC | PRN

## 2019-09-19 MED ORDER — HYOSCYAMINE SULFATE 0.125 MG SL SUBL
0.1250 mg | SUBLINGUAL_TABLET | SUBLINGUAL | Status: DC | PRN
  Filled 2019-09-19: qty 1

## 2019-09-19 MED ORDER — MIDAZOLAM HCL 2 MG/2ML IJ SOLN
INTRAMUSCULAR | Status: AC | PRN
  Administered 2019-09-19 (×5): 1 mg via INTRAVENOUS

## 2019-09-19 MED ORDER — IOHEXOL 300 MG/ML  SOLN
INTRAMUSCULAR | Status: DC | PRN
  Administered 2019-09-19: 13 mL

## 2019-09-19 MED ORDER — ROCURONIUM BROMIDE 10 MG/ML (PF) SYRINGE
PREFILLED_SYRINGE | INTRAVENOUS | Status: AC
  Filled 2019-09-19: qty 10

## 2019-09-19 MED ORDER — OXYCODONE HCL 5 MG PO TABS: 5.0000 mg | ORAL_TABLET | Freq: Once | ORAL | Status: DC | PRN

## 2019-09-19 MED ORDER — MIDAZOLAM HCL 2 MG/2ML IJ SOLN
INTRAMUSCULAR | Status: AC
  Filled 2019-09-19: qty 2

## 2019-09-19 MED ORDER — ONDANSETRON HCL 4 MG/2ML IJ SOLN
INTRAMUSCULAR | Status: AC
  Filled 2019-09-19: qty 2

## 2019-09-19 MED ORDER — ACETAMINOPHEN 325 MG PO TABS
650.0000 mg | ORAL_TABLET | ORAL | Status: DC | PRN
  Administered 2019-09-20: 650 mg via ORAL
  Filled 2019-09-19: qty 2

## 2019-09-19 MED ORDER — CEFAZOLIN SODIUM-DEXTROSE 2-4 GM/100ML-% IV SOLN: 2.0000 g | INTRAVENOUS | Status: DC

## 2019-09-19 MED ORDER — SENNOSIDES-DOCUSATE SODIUM 8.6-50 MG PO TABS: 1.0000 | ORAL_TABLET | Freq: Every evening | ORAL | Status: DC | PRN

## 2019-09-19 MED ORDER — FENTANYL CITRATE (PF) 100 MCG/2ML IJ SOLN
INTRAMUSCULAR | Status: AC
  Filled 2019-09-19: qty 4

## 2019-09-19 MED ORDER — OXYCODONE HCL 5 MG PO TABS: 5.0000 mg | ORAL_TABLET | ORAL | Status: DC | PRN

## 2019-09-19 MED ORDER — POTASSIUM CHLORIDE IN NACL 20-0.45 MEQ/L-% IV SOLN
INTRAVENOUS | Status: DC
  Administered 2019-09-19 – 2019-09-20 (×2): via INTRAVENOUS
  Filled 2019-09-19 (×3): qty 1000

## 2019-09-19 MED ORDER — FENTANYL CITRATE (PF) 100 MCG/2ML IJ SOLN
INTRAMUSCULAR | Status: AC
  Filled 2019-09-19: qty 2

## 2019-09-19 MED ORDER — FENTANYL CITRATE (PF) 250 MCG/5ML IJ SOLN
INTRAMUSCULAR | Status: DC | PRN
  Administered 2019-09-19 (×3): 50 ug via INTRAVENOUS
  Administered 2019-09-19: 100 ug via INTRAVENOUS

## 2019-09-19 MED ORDER — OXYCODONE-ACETAMINOPHEN 5-325 MG PO TABS: 1.0000 | ORAL_TABLET | ORAL | 0 refills | Status: DC | PRN

## 2019-09-19 MED ORDER — SODIUM CHLORIDE 0.9 % IV SOLN: INTRAVENOUS | Status: DC

## 2019-09-19 MED ORDER — SUGAMMADEX SODIUM 200 MG/2ML IV SOLN
INTRAVENOUS | Status: DC | PRN
  Administered 2019-09-19: 400 mg via INTRAVENOUS

## 2019-09-19 MED ORDER — LIDOCAINE 2% (20 MG/ML) 5 ML SYRINGE
INTRAMUSCULAR | Status: AC
  Filled 2019-09-19: qty 5

## 2019-09-19 MED ORDER — LIDOCAINE 2% (20 MG/ML) 5 ML SYRINGE
INTRAMUSCULAR | Status: DC | PRN
  Administered 2019-09-19: 100 mg via INTRAVENOUS

## 2019-09-19 MED ORDER — HYDROMORPHONE HCL 1 MG/ML IJ SOLN
0.2500 mg | INTRAMUSCULAR | Status: DC | PRN
  Administered 2019-09-19 (×4): 0.5 mg via INTRAVENOUS

## 2019-09-19 MED ORDER — IOHEXOL 300 MG/ML  SOLN
50.0000 mL | Freq: Once | INTRAMUSCULAR | Status: AC | PRN
  Administered 2019-09-19: 15 mL

## 2019-09-19 MED ORDER — LACTATED RINGERS IV SOLN
INTRAVENOUS | Status: DC
  Administered 2019-09-19 (×2): via INTRAVENOUS

## 2019-09-19 MED ORDER — ONDANSETRON HCL 4 MG/2ML IJ SOLN
INTRAMUSCULAR | Status: DC | PRN
  Administered 2019-09-19: 4 mg via INTRAVENOUS

## 2019-09-19 MED ORDER — DEXAMETHASONE SODIUM PHOSPHATE 10 MG/ML IJ SOLN
INTRAMUSCULAR | Status: AC
  Filled 2019-09-19: qty 1

## 2019-09-19 MED ORDER — PROPOFOL 10 MG/ML IV BOLUS
INTRAVENOUS | Status: AC
  Filled 2019-09-19: qty 40

## 2019-09-19 MED ORDER — ONDANSETRON HCL 4 MG/2ML IJ SOLN: 4.0000 mg | INTRAMUSCULAR | Status: DC | PRN

## 2019-09-19 SURGICAL SUPPLY — 51 items
APL PRP STRL LF DISP 70% ISPRP (MISCELLANEOUS) ×1
BAG DRN RND TRDRP ANRFLXCHMBR (UROLOGICAL SUPPLIES) ×1
BAG URINE DRAIN 2000ML AR STRL (UROLOGICAL SUPPLIES) ×3 IMPLANT
BASKET ZERO TIP NITINOL 2.4FR (BASKET) IMPLANT
BENZOIN TINCTURE PRP APPL 2/3 (GAUZE/BANDAGES/DRESSINGS) ×3 IMPLANT
BLADE SURG 15 STRL LF DISP TIS (BLADE) ×1 IMPLANT
BLADE SURG 15 STRL SS (BLADE) ×3
BSKT STON RTRVL ZERO TP 2.4FR (BASKET)
CATH AINSWORTH 30CC 24FR (CATHETERS) IMPLANT
CATH FOLEY LATEX FREE 22FR (CATHETERS) ×3
CATH FOLEY LF 22FR (CATHETERS) ×1 IMPLANT
CATH ROBINSON RED A/P 20FR (CATHETERS) IMPLANT
CATH URET 5FR 28IN OPEN ENDED (CATHETERS) ×3 IMPLANT
CATH URET DUAL LUMEN 6-10FR 50 (CATHETERS) ×3 IMPLANT
CATH UROLOGY TORQUE 40 (MISCELLANEOUS) ×3 IMPLANT
CATH X-FORCE N30 NEPHROSTOMY (TUBING) ×3 IMPLANT
CHLORAPREP W/TINT 26 (MISCELLANEOUS) ×3 IMPLANT
COVER SURGICAL LIGHT HANDLE (MISCELLANEOUS) ×3 IMPLANT
COVER WAND RF STERILE (DRAPES) IMPLANT
DRAPE C-ARM 42X120 X-RAY (DRAPES) ×3 IMPLANT
DRAPE LINGEMAN PERC (DRAPES) ×3 IMPLANT
DRAPE SURG IRRIG POUCH 19X23 (DRAPES) ×3 IMPLANT
DRSG PAD ABDOMINAL 8X10 ST (GAUZE/BANDAGES/DRESSINGS) ×6 IMPLANT
DRSG TEGADERM 8X12 (GAUZE/BANDAGES/DRESSINGS) ×6 IMPLANT
EXTRACTOR STONE NITINOL NGAGE (UROLOGICAL SUPPLIES) ×3 IMPLANT
FIBER LASER FLEXIVA 365 (UROLOGICAL SUPPLIES) IMPLANT
FIBER LASER TRAC TIP (UROLOGICAL SUPPLIES) IMPLANT
GAUZE SPONGE 4X4 12PLY STRL (GAUZE/BANDAGES/DRESSINGS) ×3 IMPLANT
GLOVE SURG SS PI 8.0 STRL IVOR (GLOVE) ×3 IMPLANT
GOWN STRL REUS W/TWL XL LVL3 (GOWN DISPOSABLE) ×3 IMPLANT
GUIDEWIRE AMPLAZ .035X145 (WIRE) ×6 IMPLANT
KIT BASIN OR (CUSTOM PROCEDURE TRAY) ×3 IMPLANT
KIT PROBE 340X3.4XDISP GRN (MISCELLANEOUS) IMPLANT
KIT PROBE TRILOGY 3.4X340 (MISCELLANEOUS)
KIT PROBE TRILOGY 3.9X350 (MISCELLANEOUS) ×3 IMPLANT
KIT TURNOVER KIT A (KITS) ×3 IMPLANT
MANIFOLD NEPTUNE II (INSTRUMENTS) ×3 IMPLANT
NS IRRIG 1000ML POUR BTL (IV SOLUTION) IMPLANT
PACK CYSTO (CUSTOM PROCEDURE TRAY) ×3 IMPLANT
SPONGE LAP 4X18 RFD (DISPOSABLE) ×3 IMPLANT
SUT SILK 2 0 30  PSL (SUTURE) ×2
SUT SILK 2 0 30 PSL (SUTURE) ×1 IMPLANT
SYR 10ML LL (SYRINGE) ×3 IMPLANT
SYR 20ML LL LF (SYRINGE) ×6 IMPLANT
TOWEL OR 17X26 10 PK STRL BLUE (TOWEL DISPOSABLE) ×3 IMPLANT
TOWEL OR NON WOVEN STRL DISP B (DISPOSABLE) IMPLANT
TRAY FOLEY MTR SLVR 16FR STAT (SET/KITS/TRAYS/PACK) IMPLANT
TUBING CONNECTING 10 (TUBING) ×2 IMPLANT
TUBING CONNECTING 10' (TUBING) ×1
TUBING STONE CATCHER TRILOGY (MISCELLANEOUS) ×3 IMPLANT
TUBING UROLOGY SET (TUBING) ×3 IMPLANT

## 2019-09-19 NOTE — Progress Notes (Signed)
Patient ID: Sarah Hicks, female   DOB: 02-26-1961, 58 y.o.   MRN: [id number]    . Post-op note  Subjective: The patient is doing well.  No complaints except sore throat.  Denies N/V.  Pain controlled.   Objective: Vital signs in last 24 hours: Temp:  [97.5 F (36.4 C)-98 F (36.7 C)] 97.5 F (36.4 C) (12/08 1321) Pulse Rate:  [64-96] 64 (12/08 1430) Resp:  [15-33] 18 (12/08 1430) BP: (115-157)/(72-93) 129/81 (12/08 1430) SpO2:  [96 %-100 %] 100 % (12/08 1430)  Intake/Output from previous day: No intake/output data recorded. Intake/Output this shift: Total I/O In: 1615 [I.V.:1415; IV Piggyback:200] Out: 500 [Urine:300; Blood:200]  Physical Exam:  General: Alert and oriented. Abdomen: Soft, Nondistended. Incisions: Clean and dry. Urine: pink from foley and red from perc  Lab Results: Recent Labs    09/19/19 1335  HGB 13.3  HCT 42.4    Assessment/Plan: POD#0   1) Doing well s/p Perc nephrolithotomy  2) DVT prophy  3) Adv diet as tolerate  4) Ambulate  5) Discharge per PACU criteria    LOS: 0 days   Debbrah Alar 09/19/2019, 2:38 PM

## 2019-09-19 NOTE — Procedures (Signed)
Interventional Radiology Procedure:   Indications:  Left renal pelvic stone and scheduled for percutaneous nephrolithotomy  Procedure: Left nephroureteral catheter placement  Findings: Large renal pelvic stone, catheter placed in mid/lower pole calyx.  Tip of catheter in bladder.  Complications: none     EBL: less than 10 ml  Plan: Going to OR.    Ralpheal Zappone R. Anselm Pancoast, MD  Pager: [phone]

## 2019-09-19 NOTE — Anesthesia Postprocedure Evaluation (Signed)
Anesthesia Post Note  Patient: Sarah Hicks  Procedure(s) Performed: NEPHROLITHOTOMY PERCUTANEOUS (Left )     Patient location during evaluation: PACU Anesthesia Type: General Level of consciousness: awake and alert Pain management: pain level controlled Vital Signs Assessment: post-procedure vital signs reviewed and stable Respiratory status: spontaneous breathing, nonlabored ventilation and respiratory function stable Cardiovascular status: blood pressure returned to baseline and stable Postop Assessment: no apparent nausea or vomiting Anesthetic complications: no    Last Vitals:  Vitals:   09/19/19 1415 09/19/19 1430  BP: 126/76 129/81  Pulse: 65 64  Resp: 18 18  Temp:    SpO2: 100% 100%    Last Pain:  Vitals:   09/19/19 1345  PainSc: Asleep                 Lynda Rainwater

## 2019-09-19 NOTE — Anesthesia Procedure Notes (Addendum)
Procedure Name: Intubation Date/Time: 09/19/2019 11:46 AM Performed by: Talbot Grumbling, CRNA Pre-anesthesia Checklist: Patient identified, Emergency Drugs available, Suction available and Patient being monitored Patient Re-evaluated:Patient Re-evaluated prior to induction Oxygen Delivery Method: Circle system utilized Preoxygenation: Pre-oxygenation with 100% oxygen Induction Type: IV induction Ventilation: Mask ventilation without difficulty Laryngoscope Size: 3 Grade View: Grade II Tube type: Oral Tube size: 7.5 mm Number of attempts: 1 Airway Equipment and Method: Stylet Placement Confirmation: ETT inserted through vocal cords under direct vision,  positive ETCO2 and breath sounds checked- equal and bilateral (lower partial in place post intubation) Secured at: 22 cm Tube secured with: Tape Dental Injury: Teeth and Oropharynx as per pre-operative assessment

## 2019-09-19 NOTE — Interval H&P Note (Signed)
Left perc successfully placed.

## 2019-09-19 NOTE — Discharge Instructions (Signed)
Percutaneous Nephrolithotomy, Care After °This sheet gives you information about how to care for yourself after your procedure. Your health care provider may also give you more specific instructions. If you have problems or questions, contact your health care provider. °What can I expect after the procedure? °After the procedure, it is common to have: °· Soreness or pain. °· A small amount of blood or clear fluid coming from your incision for a few days. °· Fatigue. °· Some blood in your urine. This will last for a few days. °Follow these instructions at home: °Medicines °· Take over-the-counter and prescription medicines only as told by your health care provider. °· If you were prescribed an antibiotic medicine, take it as told by your health care provider. Do not stop using the antibiotic even if you start to feel better. °· Ask your health care provider if the medicine prescribed to you: °? Requires you to avoid driving or using heavy machinery. °? Can cause constipation. You may need to take these actions to prevent or treat constipation: °§ Drink enough fluid to keep your urine pale yellow. °§ Take over-the-counter or prescription medicines. °§ Eat foods that are high in fiber, such as beans, whole grains, and fresh fruits and vegetables. °§ Limit foods that are high in fat and processed sugars, such as fried or sweet foods. °Incision care ° °· Follow instructions from your health care provider about how to take care of your incision. Make sure you: °? Wash your hands with soap and water before and after you change your bandage (dressing). If soap and water are not available, use hand sanitizer. °? Change your dressing as told by your health care provider. °? Leave stitches (sutures), skin glue, or adhesive strips in place. These skin closures may need to stay in place for 2 weeks or longer. If adhesive strip edges start to loosen and curl up, you may trim the loose edges. Do not remove adhesive strips  completely unless your health care provider tells you to do that. °· Check your incision area every day for signs of infection. Check for: °? Redness, swelling, or pain. °? More fluid or blood. °? Warmth. °? Pus or a bad smell. °· Do not take baths, swim, or use a hot tub until your health care provider approves. Ask your health care provider if you may take showers. You may only be allowed to take sponge baths. °Activity °· Avoid strenuous activities for as long as told by your health care provider. °· Return to your normal activities as told by your health care provider. Ask your health care provider what activities are safe for you. °General instructions °· If you were sent home with a catheter or surgical drain (nephrostomy tube), follow your health care provider's instructions on how to take care of it. °· Wear compression stockings as told by your health care provider. These stockings help to prevent blood clots and reduce swelling in your legs. °· Do not use any products that contain nicotine or tobacco, such as cigarettes, e-cigarettes, and chewing tobacco. These can delay incision healing after surgery. If you need help quitting, ask your health care provider. °· Keep all follow-up visits as told by your health care provider. This is important. °? If you still have a stent, your health care provider will need to remove it after 1-2 weeks. °Contact a health care provider if: °· You have a fever. °· You have redness, swelling, or pain around your incision. °· You have more   fluid or blood coming from your incision.  Your incision feels warm to the touch.  You have pus or a bad smell coming from your incision.  You lose your appetite.  You feel nauseous or you vomit.  You have been sent home with a urinary catheter or a surgical drain and urine flow suddenly stops, followed by kidney pain. Get help right away if:  You notice a large amount of blood in your urine.  You have blood clots in your  urine.  You cannot urinate.  You have chest pain or trouble breathing. Summary  After the procedure, it is common to feel soreness and discomfort. You may also see blood in your urine.  You will be told how to care for yourself after the procedure. Follow instructions on how to care for your incision and how to recognize signs of infection.  Avoid activities that require great effort. Return to activities as told by your health care provider.  Get help right away if you have blood clots in your urine, you cannot urinate, or you have trouble breathing. This information is not intended to replace advice given to you by your health care provider. Make sure you discuss any questions you have with your health care provider. Document Released: 10/25/2015 Document Revised: 04/20/2018 Document Reviewed: 04/20/2018 Elsevier Patient Education  2020 [employer].

## 2019-09-19 NOTE — Anesthesia Preprocedure Evaluation (Signed)
Anesthesia Evaluation  Patient identified by MRN, date of birth, ID band Patient awake    Reviewed: Allergy & Precautions, NPO status , Patient's Chart, lab work & pertinent test results  Airway Mallampati: II  TM Distance: >3 FB Neck ROM: Full    Dental no notable dental hx.    Pulmonary    Pulmonary exam normal breath sounds clear to auscultation       Cardiovascular hypertension, Pt. on medications Normal cardiovascular exam Rhythm:Regular Rate:Normal     Neuro/Psych  Headaches,    GI/Hepatic Neg liver ROS, GERD  ,  Endo/Other  negative endocrine ROS  Renal/GU negative Renal ROS     Musculoskeletal  (+) Arthritis ,   Abdominal   Peds  Hematology   Anesthesia Other Findings   Reproductive/Obstetrics                             Anesthesia Physical  Anesthesia Plan  ASA: III  Anesthesia Plan: General   Post-op Pain Management:    Induction: Intravenous  PONV Risk Score and Plan: 3 and Treatment may vary due to age or medical condition and Ondansetron  Airway Management Planned: Oral ETT  Additional Equipment:   Intra-op Plan:   Post-operative Plan: Extubation in OR  Informed Consent: I have reviewed the patients History and Physical, chart, labs and discussed the procedure including the risks, benefits and alternatives for the proposed anesthesia with the patient or authorized representative who has indicated his/her understanding and acceptance.     Dental advisory given  Plan Discussed with: CRNA and Anesthesiologist  Anesthesia Plan Comments:         Anesthesia Quick Evaluation

## 2019-09-19 NOTE — Consult Note (Signed)
Chief Complaint: Patient was seen in consultation today for left percutaneous nephrostomy/nephroureteral catheter placement  Referring Physician(s): Wrenn,John  Supervising Physician: Markus Daft  Patient Status: [facility] - Out-pt  TBA  History of Present Illness: Sarah Hicks is a 58 y.o. female with history of 2 cm left renal pelvic stone and prior history of hematuria who presents today for left percutaneous nephrostomy/nephroureteral catheter placement prior to operative intervention/URS.  Past Medical History:  Diagnosis Date  . Arthritis    oa  . Complication of anesthesia    told to breathe twice after a morton's neuroma surgery 2003, did fine with other surgeries since  . GERD (gastroesophageal reflux disease)   . Headache   . History of kidney stones    passed on own  . Scoliosis   . Vitamin D deficiency 03/25/2016    Past Surgical History:  Procedure Laterality Date  . ANKLE SURGERY Right 10 year ago  . cervical cryoablation  1998  . CHOLECYSTECTOMY  17 years ago  . COLONOSCOPY N/A 09/13/2013   Procedure: COLONOSCOPY;  Surgeon: Rogene Houston, MD;  Location: [facility] SUITE;  Service: Endoscopy;  Laterality: N/A;  730-moved to 38 Ann to notify pt  . FOOT SURGERY     Morton's neuroma  . KNEE SURGERY Left 10 years ago   torn ligemant and tendon repair  . TOTAL KNEE ARTHROPLASTY Right 10/25/2017   Procedure: RIGHT TOTAL KNEE ARTHROPLASTY;  Surgeon: Gaynelle Arabian, MD;  Location: [facility] ORS;  Service: Orthopedics;  Laterality: Right;  Adductor Block  . TUBAL LIGATION  19 years ago    Allergies: Latex  Medications: Prior to Admission medications   Medication Sig Start Date End Date Taking? Authorizing Provider  acetaminophen (TYLENOL) 500 MG tablet Take 1,000 mg by mouth daily as needed for moderate pain or headache.    [provider]  Cholecalciferol 125 MCG (5000 UT) capsule Take 1 capsule (5,000 Units total) by mouth daily. 09/06/18   Estill Dooms, NP  loratadine (CLARITIN) 10 MG tablet Take 10 mg by mouth daily as needed for allergies.     [provider]  Multiple Vitamins-Minerals (ONE-A-DAY 50 PLUS PO) Take 1 tablet by mouth daily.     [provider]  Probiotic Product (PROBIOTIC DAILY PO) Take 1 capsule by mouth daily.    [provider]     Family History  Problem Relation Age of Onset  . Hypertension Mother   . Cancer Father        lung  . Hypertension Father   . Heart disease Maternal Grandmother        CHF  . Hypertension Brother   . Hypertension Sister   . Breast cancer Neg Hx     Social History   Socioeconomic History  . Marital status: Married    Spouse name: Not on file  . Number of children: Not on file  . Years of education: Not on file  . Highest education level: Not on file  Occupational History  . Not on file  Social Needs  . Financial resource strain: Not on file  . Food insecurity    Worry: Not on file    Inability: Not on file  . Transportation needs    Medical: Not on file    Non-medical: Not on file  Tobacco Use  . Smoking status: Never Smoker  . Smokeless tobacco: Never Used  Substance and Sexual Activity  . Alcohol use: No  . Drug use:  No  . Sexual activity: Yes    Birth control/protection: Post-menopausal  Lifestyle  . Physical activity    Days per week: Not on file    Minutes per session: Not on file  . Stress: Not on file  Relationships  . Social Herbalist on phone: Not on file    Gets together: Not on file    Attends religious service: Not on file    Active member of club or organization: Not on file    Attends meetings of clubs or organizations: Not on file    Relationship status: Not on file  Other Topics Concern  . Not on file  Social History Narrative  . Not on file      Review of Systems currently denies fever, headache, chest pain, dyspnea, cough, nausea, vomiting or bleeding.  She does have some occasional  right lateral abdominal/flank discomfort and knee pain.  Vital Signs: Blood pressure 157/85, heart rate 91, temperature 98, respirations 18, O2 sats 100% room air LMP 04/13/2013   Physical Exam awake, alert.  Chest clear to auscultation bilaterally.  Heart with regular rate and rhythm.  Abdomen soft, positive bowel sounds, currently nontender.  No lower extremity edema.  Imaging: Mm 3d Screen Breast Bilateral  Result Date: 09/04/2019 CLINICAL DATA:  Screening. EXAM: DIGITAL SCREENING BILATERAL MAMMOGRAM WITH TOMO AND CAD COMPARISON:  Previous exam(s). ACR Breast Density Category c: The breast tissue is heterogeneously dense, which may obscure small masses. FINDINGS: There are no findings suspicious for malignancy. Images were processed with CAD. IMPRESSION: No mammographic evidence of malignancy. A result letter of this screening mammogram will be mailed directly to the patient. RECOMMENDATION: Screening mammogram in one year. (Code:SM-B-01Y) BI-RADS CATEGORY  1: Negative. Electronically Signed   By: Lillia Mountain M.D.   On: 09/04/2019 10:35    Labs:  CBC: Recent Labs    09/15/19 0905  WBC 8.9  HGB 14.0  HCT 44.4  PLT 273    COAGS: No results for input(s): INR, APTT in the last 8760 hours.  BMP: No results for input(s): NA, K, CL, CO2, GLUCOSE, BUN, CALCIUM, CREATININE, GFRNONAA, GFRAA in the last 8760 hours.  Invalid input(s): CMP  LIVER FUNCTION TESTS: No results for input(s): BILITOT, AST, ALT, ALKPHOS, PROT, ALBUMIN in the last 8760 hours.  TUMOR MARKERS: No results for input(s): AFPTM, CEA, CA199, CHROMGRNA in the last 8760 hours.  Assessment and Plan:  58 y.o. female with history of 2 cm left renal pelvic stone and prior history of hematuria who presents today for left percutaneous nephrostomy/nephroureteral catheter placement prior to operative intervention/URS.Risks and benefits of left PCN placement was discussed with the patient including, but not limited to,  infection, bleeding, significant bleeding causing loss or decrease in renal function or damage to adjacent structures.   All of the patient's questions were answered, patient is agreeable to proceed.  Consent signed and in chart.      Thank you for this interesting consult.  I greatly enjoyed meeting Sarah Hicks and look forward to participating in their care.  A copy of this report was sent to the requesting provider on this date.  Electronically Signed: D. Rowe Robert, PA-C  09/19/2019, 8:43 AM   I spent a total of 25 minutes    in face to face in clinical consultation, greater than 50% of which was counseling/coordinating care for left percutaneous nephrostomy/nephroureteral catheter placement

## 2019-09-19 NOTE — Op Note (Signed)
Procedure: 1.  First stage left percutaneous nephrolithotomy for 2 cm stone. 2.  Left antegrade nephrostogram through existing tract. 3.  Application of fluoroscopy.  Preop diagnosis: 2 cm left renal pelvic stone.  Postop diagnosis: Same.  Surgeon: Dr. Irine Seal.  Anesthesia: General.  Specimen: Stone fragments.  Drains: 64 French silicone Councill catheter with an internal 5 [place] open-ended catheter and a 14 French Foley catheter.  EBL: Approximately 200 mL.  Complications: None.  Indications: The patient is a 58 year old female who has a 2 cm left renal pelvic stone and after review of the options is elected percutaneous nephrolithotomy for stone removal.  Procedure: She had been given Ancef in radiology prior to her percutaneous access procedure and was given Cipro in the operating room for further coverage.  A general anesthetic was induced on the holding room stretcher and a Foley catheter was placed using sterile technique.  She was then rolled prone on chest rolls with great care taken to pad all pressure points and she was fitted with PAS hose.  She was then prepped with ChloraPrep and draped in the usual sterile fashion.  An Amplatz superstiff guidewire was passed through the access catheter to the bladder under fluoroscopic guidance.  The access catheter was removed and the incision was enlarged to 2 cm with a knife.  The dual-lumen 8 French catheter was passed over the wire into the distal ureter and contrast was instilled through the open port to clarify the position of the bladder which seems somewhat low possibly due to prolapse.  A second wire was then advanced into the bladder.  The dual-lumen catheter was removed.  The NephroMax nephrostomy balloon catheter was then passed over the working wire and the safety wire was secured.  The balloon was inflated to 20 atm under fluoroscopic guidance and once fully inflated the access sheath was passed over the balloon.  The  balloon was deflated and removed.  The rigid nephroscope was assembled and inserted.  Inspection revealed a few clots and minor fresh bleeding but not excessive bleeding.  The stone was visualized in the renal pelvis but during the initial manipulation moved into an upper calyx.  I was able to angle the scope and visualize the stone which was then engaged with the trilogy lithotrite.  The stone was then broken into manageable fragments the majority of which were disintegrated with the ultrasonic component and vacuum from the kidney.  1 fragment approximately 12 mm in size was retrieved with grasping forceps.  The rigid scope was removed and the flexible scope was passed.  An additional 8 mm fragment was identified at the UPJ and removed with an engage basket.  Further inspection revealed no retained fragments.  A nephrostomy catheter was prepared by using a hole punch to convert a 22 French silicone catheter into a council catheter and a 5 [place] open-end ureteral catheter was then passed through the tip of the council catheter until the proximal limb was at the level of the hub of the catheter.  The safety wire was then removed and the nephroureteral catheter was then inserted over the working wire through the sheath.  The ureteral catheter was backed out as necessary to ensure placement in the distal ureter and the tip of the council catheter was advanced into the renal pelvis where the balloon was filled with 2 mL of sterile water.  The wire was removed and an antegrade nephrostogram was performed.  The left antegrade nephrostogram revealed the nephrostomy catheter in  good position with antegrade flow and no significant extravasation.  There were possible clots in the collecting system but no radiopaque fragments were identified.  The sheath was backed out and the nephrostogram was secured to the skin with a 2-0 silk suture which was also used to tighten the nephrostomy incision.  A blue towel was used  to hold pressure on the nephrostomy site for approximately 5 minutes as there was some bleeding around the catheter initially but that abated quickly with pressure.  The nephrostomy catheter was placed to straight drainage.  A dressing was applied and secured after the drapes were removed.  She was then rolled supine on the recovery room stretcher, her anesthetic was reversed and she was moved to recovery in stable condition.  There were no complications.  She will be given the larger stone fragment and the remainder will be sent for analysis.

## 2019-09-19 NOTE — Transfer of Care (Signed)
Immediate Anesthesia Transfer of Care Note  Patient: Sarah Hicks  Procedure(s) Performed: NEPHROLITHOTOMY PERCUTANEOUS (Left )  Patient Location: PACU  Anesthesia Type:General  Level of Consciousness: sedated  Airway & Oxygen Therapy: Patient Spontanous Breathing and Patient connected to face mask oxygen  Post-op Assessment: Report given to RN and Post -op Vital signs reviewed and stable  Post vital signs: Reviewed and stable  Last Vitals:  Vitals Value Taken Time  BP 128/83 09/19/19 1321  Temp    Pulse 65 09/19/19 1322  Resp 13 09/19/19 1322  SpO2 100 % 09/19/19 1322  Vitals shown include unvalidated device data.  Last Pain: There were no vitals filed for this visit.       Complications: No apparent anesthesia complications

## 2019-09-20 ENCOUNTER — Observation Stay (HOSPITAL_COMMUNITY): Payer: 59

## 2019-09-20 ENCOUNTER — Encounter (HOSPITAL_COMMUNITY): Payer: Self-pay | Admitting: Urology

## 2019-09-20 DIAGNOSIS — N132 Hydronephrosis with renal and ureteral calculous obstruction: Secondary | ICD-10-CM | POA: Diagnosis not present

## 2019-09-20 LAB — HEMOGLOBIN AND HEMATOCRIT, BLOOD
HCT: 41.2 % (ref 36.0–46.0)
Hemoglobin: 12.8 g/dL (ref 12.0–15.0)

## 2019-09-20 MED ORDER — FLUCONAZOLE 150 MG PO TABS: 150.0000 mg | ORAL_TABLET | Freq: Every day | ORAL | 1 refills | Status: DC

## 2019-09-20 MED ORDER — HYDROMORPHONE HCL 2 MG PO TABS: 2.0000 mg | ORAL_TABLET | Freq: Four times a day (QID) | ORAL | 0 refills | Status: AC | PRN

## 2019-09-20 NOTE — Discharge Summary (Signed)
Physician Discharge Summary  Patient ID: Sarah Hicks MRN: [id number] DOB/AGE: 1961/02/17 58 y.o.  Admit date: 09/19/2019 Discharge date: 09/20/2019  Admission Diagnoses:  Left renal stone  Discharge Diagnoses:  Principal Problem:   Left renal stone   Past Medical History:  Diagnosis Date  . Arthritis    oa  . Complication of anesthesia    told to breathe twice after a morton's neuroma surgery 2003, did fine with other surgeries since  . GERD (gastroesophageal reflux disease)   . Headache   . History of kidney stones    passed on own  . Scoliosis   . Vitamin D deficiency 03/25/2016    Surgeries: Procedure(s): NEPHROLITHOTOMY PERCUTANEOUS on 09/19/2019   Consultants (if any):   Discharged Condition: Improved  Hospital Course: Sarah Hicks is an 58 y.o. female who was admitted 09/19/2019 with a diagnosis of Left renal stone and went to the operating room on 09/19/2019 and underwent the above named procedures. KUB this am shows no residual stones.  She is voiding well and the urine is clear.  She will be sent home with the tube with f/u on Friday for removal.     She was given perioperative antibiotics:  Anti-infectives (From admission, onward)   Start     Dose/Rate Route Frequency Ordered Stop   09/20/19 0000  fluconazole (DIFLUCAN) 150 MG tablet     150 mg Oral Daily 09/20/19 1220     09/19/19 2000  ceFAZolin (ANCEF) IVPB 1 g/50 mL premix     1 g 100 mL/hr over 30 Minutes Intravenous Every 8 hours 09/19/19 1803     09/19/19 0830  ciprofloxacin (CIPRO) IVPB 400 mg     400 mg 200 mL/hr over 60 Minutes Intravenous  Once 09/19/19 0816 09/19/19 1236   09/19/19 0816  ceFAZolin (ANCEF) IVPB 2g/100 mL premix  Status:  Discontinued     2 g 200 mL/hr over 30 Minutes Intravenous 30 min pre-op 09/19/19 0817 09/19/19 1733    .  She was given sequential compression devices for DVT prophylaxis.  She benefited maximally from the hospital stay and there were no complications.     Recent vital signs:  Vitals:   09/19/19 1726 09/19/19 1756  BP:  134/82  Pulse: 83 72  Resp: 19 16  Temp:  98.1 F (36.7 C)  SpO2: 100% 100%    Recent laboratory studies:  Lab Results  Component Value Date   HGB 12.8 09/20/2019   HGB 13.3 09/19/2019   HGB 14.0 09/15/2019   Lab Results  Component Value Date   WBC 8.9 09/15/2019   PLT 273 09/15/2019   Lab Results  Component Value Date   INR 0.9 09/19/2019   Lab Results  Component Value Date   NA 142 09/05/2018   K 5.1 09/05/2018   CL 102 09/05/2018   CO2 24 09/05/2018   BUN 9 09/05/2018   CREATININE 0.83 09/05/2018   GLUCOSE 86 09/05/2018    Discharge Medications:   Allergies as of 09/20/2019      Reactions   Latex Rash      Medication List    TAKE these medications   acetaminophen 500 MG tablet Commonly known as: TYLENOL Take 1,000 mg by mouth daily as needed for moderate pain or headache.   Cholecalciferol 125 MCG (5000 UT) capsule Take 1 capsule (5,000 Units total) by mouth daily.   fluconazole 150 MG tablet Commonly known as: Diflucan Take 1 tablet (150 mg total) by mouth daily.  HYDROmorphone 2 MG tablet Commonly known as: Dilaudid Take 1 tablet (2 mg total) by mouth every 6 (six) hours as needed for up to 5 days for severe pain.   loratadine 10 MG tablet Commonly known as: CLARITIN Take 10 mg by mouth daily as needed for allergies.   ONE-A-DAY 50 PLUS PO Take 1 tablet by mouth daily.   PROBIOTIC DAILY PO Take 1 capsule by mouth daily.       Diagnostic Studies: Abdomen 1 View (kub)  Result Date: 09/20/2019 CLINICAL DATA:  Left renal stone. EXAM: ABDOMEN - 1 VIEW COMPARISON:  CT 07/31/2019 FINDINGS: Percutaneous left nephroureteral stent in place. Previous stone in the region the left renal pelvis is not definitively visualized by radiograph. No stone or stone fragments along the course of the stent. Pelvic calcifications are consistent with phleboliths. Normal bowel gas pattern.  Cholecystectomy clips in the right upper quadrant. IMPRESSION: Percutaneous left nephroureteral stent in place. Previous stone in the region of the left renal pelvis is no longer seen. No stone or stone fragments along the course of the ureteral stent. Electronically Signed   By: Keith Rake M.D.   On: 09/20/2019 05:16   Dg C-arm 1-60 Min-no Report  Result Date: 09/19/2019 Fluoroscopy was utilized by the requesting physician.  No radiographic interpretation.   Mm 3d Screen Breast Bilateral  Result Date: 09/04/2019 CLINICAL DATA:  Screening. EXAM: DIGITAL SCREENING BILATERAL MAMMOGRAM WITH TOMO AND CAD COMPARISON:  Previous exam(s). ACR Breast Density Category c: The breast tissue is heterogeneously dense, which may obscure small masses. FINDINGS: There are no findings suspicious for malignancy. Images were processed with CAD. IMPRESSION: No mammographic evidence of malignancy. A result letter of this screening mammogram will be mailed directly to the patient. RECOMMENDATION: Screening mammogram in one year. (Code:SM-B-01Y) BI-RADS CATEGORY  1: Negative. Electronically Signed   By: Lillia Mountain M.D.   On: 09/04/2019 10:35   Ir Ureteral Stent Left New Access W/o Sep Nephrostomy Cath  Result Date: 09/19/2019 INDICATION: 58 year old with large left renal pelvic stone and scheduled for nephrolithotomy procedure. Patient needs a percutaneous nephroureteral catheter. EXAM: PLACEMENT OF LEFT NEPHROURETERAL CATHETER COMPARISON:  None. MEDICATIONS: Ancef 2 g; The antibiotic was administered in an appropriate time frame prior to skin puncture. ANESTHESIA/SEDATION: Fentanyl 200 mcg IV; Versed 5.0 mg IV Moderate Sedation Time:  20 minutes The patient was continuously monitored during the procedure by the interventional radiology nurse under my direct supervision. CONTRAST:  15 mL Omnipaque 300-administered into the collecting system(s) FLUOROSCOPY TIME:  Fluoroscopy Time: 4 minutes 42 seconds (89 mGy).  COMPLICATIONS: None immediate. PROCEDURE: Informed written consent was obtained from the patient after a thorough discussion of the procedural risks, benefits and alternatives. All questions were addressed. Maximal Sterile Barrier Technique was utilized including caps, mask, sterile gowns, sterile gloves, sterile drape, hand hygiene and skin antiseptic. A timeout was performed prior to the initiation of the procedure. Patient was placed prone on the interventional table. Large stone was identified in the left renal pelvis with fluoroscopy. Minimal hydronephrosis on ultrasound. Due to the minimal hydronephrosis, the renal pelvic stone was targeted with fluoroscopy in order to opacify the renal collecting system. Skin was anesthetized with 1% lidocaine. 21 gauge needle was directed onto the left renal pelvic stone using fluoroscopic guidance. Contrast was injected to opacify the renal collecting system. In addition, gas was injected into the collecting system in order to identify a posterior calyx. Posterior calyx in the mid/lower right kidney was identified and targeted.  A second 21 gauge needle was directed into this calyx using fluoroscopic guidance. Contrast injection confirmed placement in the collecting system. 0.018 wire was advanced in the renal collecting system and dilator set was placed. A Kumpe catheter was placed over a Bentson wire and advanced beyond the pelvic stone and into the left ureter. Catheter was advanced into the urinary bladder. Catheter was sutured to skin and a bandage was placed. Fluoroscopic images were taken and saved for this procedure. FINDINGS: Large stone in left renal pelvis. Minimal hydronephrosis. Mid/lower pole calyx was accessed and 5 French catheter was successfully advanced beyond the stone. Catheter tip placed in the urinary bladder. IMPRESSION: Successful placement of left nephroureteral catheter using fluoroscopic guidance. Electronically Signed   By: Markus Daft M.D.   On:  09/19/2019 11:09    Disposition: Discharge disposition: 01-Home or Self Care       Discharge Instructions    Discontinue IV   Complete by: As directed       Follow-up Information    [facility] Follow up.   Why: I will have the office call you about coming Friday for tube removal but if you haven't heard from [place] by midday Thursday, please call for a time.  Contact information: [address] [place]           Signed: Irine Seal 09/20/2019, 12:23 PM

## 2019-09-20 NOTE — Progress Notes (Signed)
1 Day Post-Op  Subjective: She is doing well post op with some expected pain.  Her Hgb is down minimally to 12.8.  Urine is light pink in NT.  Foley is out.  No void yet.  ROS:  Review of Systems  All other systems reviewed and are negative.   Anti-infectives: Anti-infectives (From admission, onward)   Start     Dose/Rate Route Frequency Ordered Stop   09/19/19 2000  ceFAZolin (ANCEF) IVPB 1 g/50 mL premix     1 g 100 mL/hr over 30 Minutes Intravenous Every 8 hours 09/19/19 1803     09/19/19 0830  ciprofloxacin (CIPRO) IVPB 400 mg     400 mg 200 mL/hr over 60 Minutes Intravenous  Once 09/19/19 0816 09/19/19 1236   09/19/19 0816  ceFAZolin (ANCEF) IVPB 2g/100 mL premix  Status:  Discontinued     2 g 200 mL/hr over 30 Minutes Intravenous 30 min pre-op 09/19/19 [id number] 09/19/19 1733      Current Facility-Administered Medications  Medication Dose Route Frequency Provider Last Rate Last Dose  . 0.45 % NaCl with KCl 20 mEq / L infusion   Intravenous Continuous Irine Seal, MD 100 mL/hr at 09/19/19 1600    . acetaminophen (TYLENOL) tablet 650 mg  650 mg Oral Q4H PRN Irine Seal, MD      . bisacodyl (DULCOLAX) suppository 10 mg  10 mg Rectal Daily PRN Irine Seal, MD      . ceFAZolin (ANCEF) IVPB 1 g/50 mL premix  1 g Intravenous Q8H Irine Seal, MD 100 mL/hr at 09/20/19 0535 1 g at 09/20/19 0535  . HYDROmorphone (DILAUDID) injection 0.5-1 mg  0.5-1 mg Intravenous Q2H PRN Irine Seal, MD   0.5 mg at 09/20/19 0553  . hyoscyamine (LEVSIN SL) SL tablet 0.125 mg  0.125 mg Sublingual Q4H PRN Irine Seal, MD      . lactated ringers infusion   Intravenous Continuous Lynda Rainwater, MD   Stopped at 09/19/19 1323  . ondansetron (ZOFRAN) injection 4 mg  4 mg Intravenous Q4H PRN Irine Seal, MD      . oxyCODONE (Oxy IR/ROXICODONE) immediate release tablet 5 mg  5 mg Oral Q4H PRN Irine Seal, MD      . senna-docusate (Senokot-S) tablet 1 tablet  1 tablet Oral QHS PRN Irine Seal, MD      . sodium  phosphate (FLEET) 7-19 GM/118ML enema 1 enema  1 enema Rectal Once PRN Irine Seal, MD         Objective: Vital signs in last 24 hours: Temp:  [97.5 F (36.4 C)-98.1 F (36.7 C)] 97.6 F (36.4 C) (12/09 0535) Pulse Rate:  [61-96] 69 (12/09 0535) Resp:  [15-33] 16 (12/09 0535) BP: (114-157)/(70-93) 145/86 (12/09 0535) SpO2:  [93 %-100 %] 97 % (12/09 0535) Weight:  [99.8 kg] 99.8 kg (12/08 2130)  Intake/Output from previous day: 12/08 0701 - 12/09 0700 In: 2839.7 [P.O.:120; I.V.:2430.8; IV Piggyback:288.9] Out: 1850 [Urine:1650; Blood:200] Intake/Output this shift: No intake/output data recorded.   Physical Exam Constitutional:      Appearance: Normal appearance.  Cardiovascular:     Rate and Rhythm: Normal rate and regular rhythm.  Pulmonary:     Effort: Pulmonary effort is normal.     Breath sounds: Normal breath sounds.  Abdominal:     General: Abdomen is flat.     Palpations: Abdomen is soft.     Comments: Left NT site dressing dry.   Neurological:     Mental Status: She is  alert.     Lab Results:  Recent Labs    09/19/19 1335 09/20/19 0534  HGB 13.3 12.8  HCT 42.4 41.2   BMET No results for input(s): NA, K, CL, CO2, GLUCOSE, BUN, CREATININE, CALCIUM in the last 72 hours. PT/INR Recent Labs    09/19/19 0850  LABPROT 11.6  INR 0.9   ABG No results for input(s): PHART, HCO3 in the last 72 hours.  Invalid input(s): PCO2, PO2  Studies/Results: Abdomen 1 View (kub)  Result Date: 09/20/2019 CLINICAL DATA:  Left renal stone. EXAM: ABDOMEN - 1 VIEW COMPARISON:  CT 07/31/2019 FINDINGS: Percutaneous left nephroureteral stent in place. Previous stone in the region the left renal pelvis is not definitively visualized by radiograph. No stone or stone fragments along the course of the stent. Pelvic calcifications are consistent with phleboliths. Normal bowel gas pattern. Cholecystectomy clips in the right upper quadrant. IMPRESSION: Percutaneous left  nephroureteral stent in place. Previous stone in the region of the left renal pelvis is no longer seen. No stone or stone fragments along the course of the ureteral stent. Electronically Signed   By: Keith Rake M.D.   On: 09/20/2019 05:16   Dg C-arm 1-60 Min-no Report  Result Date: 09/19/2019 Fluoroscopy was utilized by the requesting physician.  No radiographic interpretation.   Ir Ureteral Stent Left New Access W/o Sep Nephrostomy Cath  Result Date: 09/19/2019 INDICATION: 58 year old with large left renal pelvic stone and scheduled for nephrolithotomy procedure. Patient needs a percutaneous nephroureteral catheter. EXAM: PLACEMENT OF LEFT NEPHROURETERAL CATHETER COMPARISON:  None. MEDICATIONS: Ancef 2 g; The antibiotic was administered in an appropriate time frame prior to skin puncture. ANESTHESIA/SEDATION: Fentanyl 200 mcg IV; Versed 5.0 mg IV Moderate Sedation Time:  20 minutes The patient was continuously monitored during the procedure by the interventional radiology nurse under my direct supervision. CONTRAST:  15 mL Omnipaque 300-administered into the collecting system(s) FLUOROSCOPY TIME:  Fluoroscopy Time: 4 minutes 42 seconds (89 mGy). COMPLICATIONS: None immediate. PROCEDURE: Informed written consent was obtained from the patient after a thorough discussion of the procedural risks, benefits and alternatives. All questions were addressed. Maximal Sterile Barrier Technique was utilized including caps, mask, sterile gowns, sterile gloves, sterile drape, hand hygiene and skin antiseptic. A timeout was performed prior to the initiation of the procedure. Patient was placed prone on the interventional table. Large stone was identified in the left renal pelvis with fluoroscopy. Minimal hydronephrosis on ultrasound. Due to the minimal hydronephrosis, the renal pelvic stone was targeted with fluoroscopy in order to opacify the renal collecting system. Skin was anesthetized with 1% lidocaine. 21 gauge  needle was directed onto the left renal pelvic stone using fluoroscopic guidance. Contrast was injected to opacify the renal collecting system. In addition, gas was injected into the collecting system in order to identify a posterior calyx. Posterior calyx in the mid/lower right kidney was identified and targeted. A second 21 gauge needle was directed into this calyx using fluoroscopic guidance. Contrast injection confirmed placement in the collecting system. 0.018 wire was advanced in the renal collecting system and dilator set was placed. A Kumpe catheter was placed over a Bentson wire and advanced beyond the pelvic stone and into the left ureter. Catheter was advanced into the urinary bladder. Catheter was sutured to skin and a bandage was placed. Fluoroscopic images were taken and saved for this procedure. FINDINGS: Large stone in left renal pelvis. Minimal hydronephrosis. Mid/lower pole calyx was accessed and 5 French catheter was successfully advanced beyond the  stone. Catheter tip placed in the urinary bladder. IMPRESSION: Successful placement of left nephroureteral catheter using fluoroscopic guidance. Electronically Signed   By: Markus Daft M.D.   On: 09/19/2019 11:09     Assessment and Plan: Doing well 1 day post op from Left PCNL.  She is stone free.   I will reassess later today to see if she is up for discharge.       LOS: 0 days    Irine Seal 09/20/2019 [phone]

## 2019-09-21 LAB — SURGICAL PATHOLOGY

## 2019-09-28 LAB — CALCULI, WITH PHOTOGRAPH (CLINICAL LAB)
Calcium Oxalate Dihydrate: 50 %
Calcium Oxalate Monohydrate: 50 %
Weight Calculi: 892 mg

## 2019-11-03 ENCOUNTER — Other Ambulatory Visit: Payer: 59 | Admitting: Adult Health

## 2019-12-14 ENCOUNTER — Telehealth: Payer: Self-pay | Admitting: Adult Health

## 2019-12-14 NOTE — Telephone Encounter (Signed)

## 2019-12-18 ENCOUNTER — Other Ambulatory Visit (HOSPITAL_COMMUNITY)
Admission: RE | Admit: 2019-12-18 | Discharge: 2019-12-18 | Disposition: A | Discharge status: Home / Self Care | Payer: 59 | Source: Ambulatory Visit | Attending: Adult Health | Admitting: Adult Health

## 2019-12-18 ENCOUNTER — Ambulatory Visit (INDEPENDENT_AMBULATORY_CARE_PROVIDER_SITE_OTHER): Payer: 59 | Admitting: Adult Health

## 2019-12-18 ENCOUNTER — Other Ambulatory Visit: Payer: Self-pay

## 2019-12-18 ENCOUNTER — Encounter: Payer: Self-pay | Admitting: Adult Health

## 2019-12-18 VITALS — BP 133/87 | HR 71 | Ht 65.0 in | Wt 222.0 lb

## 2019-12-18 DIAGNOSIS — Z1211 Encounter for screening for malignant neoplasm of colon: Secondary | ICD-10-CM

## 2019-12-18 DIAGNOSIS — Z1212 Encounter for screening for malignant neoplasm of rectum: Secondary | ICD-10-CM | POA: Diagnosis not present

## 2019-12-18 DIAGNOSIS — Z01419 Encounter for gynecological examination (general) (routine) without abnormal findings: Secondary | ICD-10-CM | POA: Diagnosis present

## 2019-12-18 DIAGNOSIS — E559 Vitamin D deficiency, unspecified: Secondary | ICD-10-CM

## 2019-12-18 LAB — HEMOCCULT GUIAC POC 1CARD (OFFICE): Fecal Occult Blood, POC: NEGATIVE

## 2019-12-18 NOTE — Progress Notes (Signed)
Patient ID: Sarah Hicks, female   DOB: 10-21-1960, 59 y.o.   MRN: [id number] History of Present Illness: Sarah Hicks is a 59 year old white female, married, PM in for a well woman gyn exam and pap. She had kidney stone removed in December 2020.  PCP is Dr Woody Seller   Current Medications, Allergies, Past Medical History, Past Surgical History, Family History and Social History were reviewed in [place] record.     Review of Systems: Patient denies any headaches, hearing loss, fatigue, blurred vision, shortness of breath, chest pain, abdominal pain, problems with bowel movements, urination, or intercourse. No mood swings, has pain in left knee, needs replacement. Has right flank pain at times.    Physical Exam:BP 133/87 (BP Location: Left Arm, Patient Position: Sitting, Cuff Size: Normal)   Pulse 71   Ht 5\' 5"  (1.651 m)   Wt 222 lb (100.7 kg)   LMP 04/13/2013   BMI 36.94 kg/m  General:  Well developed, well nourished, no acute distress Skin:  Warm and dry Neck:  Midline trachea, normal thyroid, good ROM, no lymphadenopathy Lungs; Clear to auscultation bilaterally Breast:  No dominant palpable mass, retraction, or nipple discharge Cardiovascular: Regular rate and rhythm Abdomen:  Soft, non tender, no hepatosplenomegaly Pelvic:  External genitalia is normal in appearance, no lesions.  The vagina is pale with loss of moisture and rugae. Urethra has no lesions or masses. The cervix is smooth, pap with high risk HPV 16/18 genotyping performed.  Uterus is felt to be normal size, shape, and contour.  No adnexal masses or tenderness noted.Bladder is non tender, no masses felt. Rectal: Good sphincter tone, no polyps, or hemorrhoids felt.  Hemoccult negative. Extremities/musculoskeletal:  No swelling or varicosities noted, no clubbing or cyanosis Psych:  No mood changes, alert and cooperative,seems happy Fall risk is low PHQ 2 score is 0. Examination chaperoned by Diona Fanti  CMA.  Impression and Plan: 1. Encounter for gynecological examination with Papanicolaou smear of cervix Pap sent  Physical in 1 year Pap in 3 if normal Mammogram yearly Check CBC,CMP,TSH and lipids  2. Screening for colorectal cancer Colonoscopy per GI  3. Vitamin D insufficiency Take 5000 IU daily Check Vitamin D

## 2019-12-19 ENCOUNTER — Telehealth: Payer: Self-pay | Admitting: Adult Health

## 2019-12-19 LAB — LIPID PANEL
Chol/HDL Ratio: 3.4 ratio (ref 0.0–4.4)
Cholesterol, Total: 212 mg/dL — ABNORMAL HIGH (ref 100–199)
HDL: 62 mg/dL (ref >39)
LDL Chol Calc (NIH): 103 mg/dL — ABNORMAL HIGH (ref 0–99)
Triglycerides: 278 mg/dL — ABNORMAL HIGH (ref 0–149)
VLDL Cholesterol Cal: 47 mg/dL — ABNORMAL HIGH (ref 5–40)

## 2019-12-19 LAB — COMPREHENSIVE METABOLIC PANEL
ALT: 39 IU/L — ABNORMAL HIGH (ref 0–32)
AST: 25 IU/L (ref 0–40)
Albumin/Globulin Ratio: 1.7 (ref 1.2–2.2)
Albumin: 4.4 g/dL (ref 3.8–4.9)
Alkaline Phosphatase: 136 IU/L — ABNORMAL HIGH (ref 39–117)
BUN/Creatinine Ratio: 17 (ref 9–23)
BUN: 14 mg/dL (ref 6–24)
Bilirubin Total: 0.3 mg/dL (ref 0.0–1.2)
CO2: 26 mmol/L (ref 20–29)
Calcium: 9.8 mg/dL (ref 8.7–10.2)
Chloride: 101 mmol/L (ref 96–106)
Creatinine, Ser: 0.84 mg/dL (ref 0.57–1.00)
GFR calc Af Amer: 89 mL/min/{1.73_m2} (ref >59)
GFR calc non Af Amer: 77 mL/min/{1.73_m2} (ref >59)
Globulin, Total: 2.6 g/dL (ref 1.5–4.5)
Glucose: 84 mg/dL (ref 65–99)
Potassium: 4.8 mmol/L (ref 3.5–5.2)
Sodium: 139 mmol/L (ref 134–144)
Total Protein: 7 g/dL (ref 6.0–8.5)

## 2019-12-19 LAB — CBC
Hematocrit: 43.3 % (ref 34.0–46.6)
Hemoglobin: 14 g/dL (ref 11.1–15.9)
MCH: 28.5 pg (ref 26.6–33.0)
MCHC: 32.3 g/dL (ref 31.5–35.7)
MCV: 88 fL (ref 79–97)
Platelets: 279 10*3/uL (ref 150–450)
RBC: 4.91 x10E6/uL (ref 3.77–5.28)
RDW: 13.1 % (ref 11.7–15.4)
WBC: 9.3 10*3/uL (ref 3.4–10.8)

## 2019-12-19 LAB — TSH: TSH: 4.24 u[IU]/mL (ref 0.450–4.500)

## 2019-12-19 LAB — VITAMIN D 25 HYDROXY (VIT D DEFICIENCY, FRACTURES): Vit D, 25-Hydroxy: 44 ng/mL (ref 30.0–100.0)

## 2019-12-19 NOTE — Telephone Encounter (Signed)
Left message to watch fats and carbs, vitamin D is good, 1 liver enzyme was elevated decrease tylenol usage, will send you a copy

## 2019-12-20 LAB — CYTOLOGY - PAP
Comment: NEGATIVE
Diagnosis: NEGATIVE
High risk HPV: NEGATIVE

## 2020-08-15 ENCOUNTER — Other Ambulatory Visit: Payer: Self-pay | Admitting: Adult Health

## 2020-08-15 DIAGNOSIS — Z1231 Encounter for screening mammogram for malignant neoplasm of breast: Secondary | ICD-10-CM

## 2020-09-09 ENCOUNTER — Encounter (INDEPENDENT_AMBULATORY_CARE_PROVIDER_SITE_OTHER): Payer: Self-pay | Admitting: *Deleted

## 2020-09-25 ENCOUNTER — Ambulatory Visit: Payer: 59

## 2020-10-01 ENCOUNTER — Other Ambulatory Visit: Payer: Self-pay

## 2020-10-01 ENCOUNTER — Ambulatory Visit
Admission: RE | Admit: 2020-10-01 | Discharge: 2020-10-01 | Disposition: A | Discharge status: Home / Self Care | Payer: 59 | Source: Ambulatory Visit | Attending: Adult Health | Admitting: Adult Health

## 2020-10-01 DIAGNOSIS — Z1231 Encounter for screening mammogram for malignant neoplasm of breast: Secondary | ICD-10-CM

## 2020-12-31 ENCOUNTER — Other Ambulatory Visit (INDEPENDENT_AMBULATORY_CARE_PROVIDER_SITE_OTHER): Payer: Self-pay | Admitting: *Deleted

## 2020-12-31 ENCOUNTER — Telehealth (INDEPENDENT_AMBULATORY_CARE_PROVIDER_SITE_OTHER): Payer: Self-pay | Admitting: *Deleted

## 2020-12-31 ENCOUNTER — Encounter (INDEPENDENT_AMBULATORY_CARE_PROVIDER_SITE_OTHER): Payer: Self-pay | Admitting: *Deleted

## 2020-12-31 MED ORDER — PEG 3350-KCL-NA BICARB-NACL 420 G PO SOLR: 4000.0000 mL | Freq: Once | ORAL | 0 refills | Status: AC

## 2020-12-31 NOTE — Telephone Encounter (Signed)
Patient needs trilyte 

## 2020-12-31 NOTE — Telephone Encounter (Signed)
done

## 2021-01-06 ENCOUNTER — Other Ambulatory Visit (INDEPENDENT_AMBULATORY_CARE_PROVIDER_SITE_OTHER): Payer: Self-pay

## 2021-01-06 DIAGNOSIS — Z1211 Encounter for screening for malignant neoplasm of colon: Secondary | ICD-10-CM

## 2021-01-06 DIAGNOSIS — Z1212 Encounter for screening for malignant neoplasm of rectum: Secondary | ICD-10-CM

## 2021-01-29 ENCOUNTER — Encounter (INDEPENDENT_AMBULATORY_CARE_PROVIDER_SITE_OTHER): Payer: Self-pay | Admitting: *Deleted

## 2021-01-29 NOTE — Telephone Encounter (Signed)
error    This encounter was created in error - please disregard.

## 2021-02-18 ENCOUNTER — Other Ambulatory Visit (HOSPITAL_COMMUNITY): Payer: 59

## 2021-02-25 ENCOUNTER — Other Ambulatory Visit: Payer: Self-pay

## 2021-02-25 ENCOUNTER — Other Ambulatory Visit (HOSPITAL_COMMUNITY)
Admission: RE | Admit: 2021-02-25 | Discharge: 2021-02-25 | Disposition: A | Discharge status: Home / Self Care | Payer: 59 | Source: Ambulatory Visit | Attending: Internal Medicine | Admitting: Internal Medicine

## 2021-02-25 DIAGNOSIS — Z20822 Contact with and (suspected) exposure to covid-19: Secondary | ICD-10-CM | POA: Diagnosis not present

## 2021-02-25 DIAGNOSIS — Z01812 Encounter for preprocedural laboratory examination: Secondary | ICD-10-CM | POA: Diagnosis not present

## 2021-02-26 LAB — SARS CORONAVIRUS 2 (TAT 6-24 HRS): SARS Coronavirus 2: NEGATIVE

## 2021-02-27 ENCOUNTER — Ambulatory Visit (HOSPITAL_COMMUNITY)
Admission: RE | Admit: 2021-02-27 | Discharge: 2021-02-27 | Disposition: A | Discharge status: Home / Self Care | Payer: 59 | Attending: Internal Medicine | Admitting: Internal Medicine

## 2021-02-27 ENCOUNTER — Other Ambulatory Visit: Payer: Self-pay

## 2021-02-27 ENCOUNTER — Encounter (HOSPITAL_COMMUNITY): Payer: Self-pay | Admitting: Internal Medicine

## 2021-02-27 ENCOUNTER — Encounter (HOSPITAL_COMMUNITY)
Admission: RE | Disposition: A | Discharge status: Home / Self Care | Payer: Self-pay | Source: Home / Self Care | Attending: Internal Medicine

## 2021-02-27 DIAGNOSIS — D120 Benign neoplasm of cecum: Secondary | ICD-10-CM

## 2021-02-27 DIAGNOSIS — Z79899 Other long term (current) drug therapy: Secondary | ICD-10-CM | POA: Insufficient documentation

## 2021-02-27 DIAGNOSIS — K648 Other hemorrhoids: Secondary | ICD-10-CM | POA: Insufficient documentation

## 2021-02-27 DIAGNOSIS — Z1211 Encounter for screening for malignant neoplasm of colon: Secondary | ICD-10-CM

## 2021-02-27 DIAGNOSIS — Z86010 Personal history of colonic polyps: Secondary | ICD-10-CM | POA: Insufficient documentation

## 2021-02-27 DIAGNOSIS — D124 Benign neoplasm of descending colon: Secondary | ICD-10-CM

## 2021-02-27 DIAGNOSIS — Z96651 Presence of right artificial knee joint: Secondary | ICD-10-CM | POA: Insufficient documentation

## 2021-02-27 DIAGNOSIS — Z91040 Latex allergy status: Secondary | ICD-10-CM | POA: Diagnosis not present

## 2021-02-27 DIAGNOSIS — Z1212 Encounter for screening for malignant neoplasm of rectum: Secondary | ICD-10-CM

## 2021-02-27 HISTORY — PX: HOT HEMOSTASIS: SHX5433

## 2021-02-27 HISTORY — PX: POLYPECTOMY: SHX5525

## 2021-02-27 HISTORY — PX: COLONOSCOPY: SHX5424

## 2021-02-27 LAB — HM COLONOSCOPY

## 2021-02-27 SURGERY — COLONOSCOPY
Anesthesia: Moderate Sedation

## 2021-02-27 MED ORDER — MEPERIDINE HCL 50 MG/ML IJ SOLN
INTRAMUSCULAR | Status: DC | PRN
  Administered 2021-02-27 (×2): 25 mg

## 2021-02-27 MED ORDER — MIDAZOLAM HCL 5 MG/5ML IJ SOLN
INTRAMUSCULAR | Status: AC
  Filled 2021-02-27: qty 10

## 2021-02-27 MED ORDER — SODIUM CHLORIDE 0.9 % IV SOLN: INTRAVENOUS | Status: DC

## 2021-02-27 MED ORDER — MEPERIDINE HCL 50 MG/ML IJ SOLN
INTRAMUSCULAR | Status: AC
  Filled 2021-02-27: qty 1

## 2021-02-27 MED ORDER — MIDAZOLAM HCL 5 MG/5ML IJ SOLN
INTRAMUSCULAR | Status: DC | PRN
  Administered 2021-02-27: 1 mg via INTRAVENOUS
  Administered 2021-02-27 (×2): 2 mg via INTRAVENOUS
  Administered 2021-02-27 (×2): 1 mg via INTRAVENOUS
  Administered 2021-02-27: 2 mg via INTRAVENOUS
  Administered 2021-02-27: 1 mg via INTRAVENOUS

## 2021-02-27 NOTE — Discharge Instructions (Signed)
No aspirin or NSAIDs for 3 days Resume other medications and diet as before. No driving for 24 hours. Physician will call with biopsy results. Next colonoscopy in 5 years.   Hemorrhoids Hemorrhoids are swollen veins that may develop:  In the butt (rectum). These are called internal hemorrhoids.  Around the opening of the butt (anus). These are called external hemorrhoids. Hemorrhoids can cause pain, itching, or bleeding. Most of the time, they do not cause serious problems. They usually get better with diet changes, lifestyle changes, and other home treatments. What are the causes? This condition may be caused by:  Having trouble pooping (constipation).  Pushing hard (straining) to poop.  Watery poop (diarrhea).  Pregnancy.  Being very overweight (obese).  Sitting for long periods of time.  Heavy lifting or other activity that causes you to strain.  Anal sex.  Riding a bike for a long period of time. What are the signs or symptoms? Symptoms of this condition include:  Pain.  Itching or soreness in the butt.  Bleeding from the butt.  Leaking poop.  Swelling in the area.  One or more lumps around the opening of your butt. How is this diagnosed? A doctor can often diagnose this condition by looking at the affected area. The doctor may also:  Do an exam that involves feeling the area with a gloved hand (digital rectal exam).  Examine the area inside your butt using a small tube (anoscope).  Order blood tests. This may be done if you have lost a lot of blood.  Have you get a test that involves looking inside the colon using a flexible tube with a camera on the end (sigmoidoscopy or colonoscopy). How is this treated? This condition can usually be treated at home. Your doctor may tell you to change what you eat, make lifestyle changes, or try home treatments. If these do not help, procedures can be done to remove the hemorrhoids or make them smaller. These may  involve:  Placing rubber bands at the base of the hemorrhoids to cut off their blood supply.  Injecting medicine into the hemorrhoids to shrink them.  Shining a type of light energy onto the hemorrhoids to cause them to fall off.  Doing surgery to remove the hemorrhoids or cut off their blood supply. Follow these instructions at home: Eating and drinking  Eat foods that have a lot of fiber in them. These include whole grains, beans, nuts, fruits, and vegetables.  Ask your doctor about taking products that have added fiber (fibersupplements).  Reduce the amount of fat in your diet. You can do this by: ? Eating low-fat dairy products. ? Eating less red meat. ? Avoiding processed foods.  Drink enough fluid to keep your pee (urine) pale yellow.   Managing pain and swelling  Take a warm-water bath (sitz bath) for 20 minutes to ease pain. Do this 3-4 times a day. You may do this in a bathtub or using a portable sitz bath that fits over the toilet.  If told, put ice on the painful area. It may be helpful to use ice between your warm baths. ? Put ice in a plastic bag. ? Place a towel between your skin and the bag. ? Leave the ice on for 20 minutes, 2-3 times a day.   General instructions  Take over-the-counter and prescription medicines only as told by your doctor. ? Medicated creams and medicines may be used as told.  Exercise often. Ask your doctor how much and  what kind of exercise is best for you.  Go to the bathroom when you have the urge to poop. Do not wait.  Avoid pushing too hard when you poop.  Keep your butt dry and clean. Use wet toilet paper or moist towelettes after pooping.  Do not sit on the toilet for a long time.  Keep all follow-up visits as told by your doctor. This is important. Contact a doctor if you:  Have pain and swelling that do not get better with treatment or medicine.  Have trouble pooping.  Cannot poop.  Have pain or swelling outside the  area of the hemorrhoids. Get help right away if you have:  Bleeding that will not stop. Summary  Hemorrhoids are swollen veins in the butt or around the opening of the butt.  They can cause pain, itching, or bleeding.  Eat foods that have a lot of fiber in them. These include whole grains, beans, nuts, fruits, and vegetables.  Take a warm-water bath (sitz bath) for 20 minutes to ease pain. Do this 3-4 times a day. This information is not intended to replace advice given to you by your health care provider. Make sure you discuss any questions you have with your health care provider. Document Revised: 10/06/2018 Document Reviewed: 02/17/2018 Elsevier Patient Education  [place].  Colon Polyps  Colon polyps are tissue growths inside the colon, which is part of the large intestine. They are one of the types of polyps that can grow in the body. A polyp may be a round bump or a mushroom-shaped growth. You could have one polyp or more than one. Most colon polyps are noncancerous (benign). However, some colon polyps can become cancerous over time. Finding and removing the polyps early can help prevent this. What are the causes? The exact cause of colon polyps is not known. What increases the risk? The following factors may make you more likely to develop this condition:  Having a family history of colorectal cancer or colon polyps.  Being older than 60 years of age.  Being younger than 60 years of age and having a significant family history of colorectal cancer or colon polyps or a genetic condition that puts you at higher risk of getting colon polyps.  Having inflammatory bowel disease, such as ulcerative colitis or Crohn's disease.  Having certain conditions passed from parent to child (hereditary conditions), such as: ? Familial adenomatous polyposis (FAP). ? Lynch syndrome. ? Turcot syndrome. ? Peutz-Jeghers syndrome. ? MUTYH-associated polyposis (MAP).  Being  overweight.  Certain lifestyle factors. These include smoking cigarettes, drinking too much alcohol, not getting enough exercise, and eating a diet that is high in fat and red meat and low in fiber.  Having had childhood cancer that was treated with radiation of the abdomen. What are the signs or symptoms? Many times, there are no symptoms. If you have symptoms, they may include:  Blood coming from the rectum during a bowel movement.  Blood in the stool (feces). The blood may be bright red or very dark in color.  Pain in the abdomen.  A change in bowel habits, such as constipation or diarrhea. How is this diagnosed? This condition is diagnosed with a colonoscopy. This is a procedure in which a lighted, flexible scope is inserted into the opening between the buttocks (anus) and then passed into the colon to examine the area. Polyps are sometimes found when a colonoscopy is done as part of routine cancer screening tests. How is this  treated? This condition is treated by removing any polyps that are found. Most polyps can be removed during a colonoscopy. Those polyps will then be tested for cancer. Additional treatment may be needed depending on the results of testing. Follow these instructions at home: Eating and drinking  Eat foods that are high in fiber, such as fruits, vegetables, and whole grains.  Eat foods that are high in calcium and vitamin D, such as milk, cheese, yogurt, eggs, liver, fish, and broccoli.  Limit foods that are high in fat, such as fried foods and desserts.  Limit the amount of red meat, precooked or cured meat, or other processed meat that you eat, such as hot dogs, sausages, bacon, or meat loaves.  Limit sugary drinks.   Lifestyle  Maintain a healthy weight, or lose weight if recommended by your health care provider.  Exercise every day or as told by your health care provider.  Do not use any products that contain nicotine or tobacco, such as cigarettes,  e-cigarettes, and chewing tobacco. If you need help quitting, ask your health care provider.  Do not drink alcohol if: ? Your health care provider tells you not to drink. ? You are pregnant, may be pregnant, or are planning to become pregnant.  If you drink alcohol: ? Limit how much you use to:  0-1 drink a day for women.  0-2 drinks a day for men. ? Know how much alcohol is in your drink. In the [place] one drink equals one 12 oz bottle of beer (355 mL), one 5 oz glass of wine (148 mL), or one 1 oz glass of hard liquor (44 mL). General instructions  Take over-the-counter and prescription medicines only as told by your health care provider.  Keep all follow-up visits. This is important. This includes having regularly scheduled colonoscopies. Talk to your health care provider about when you need a colonoscopy. Contact a health care provider if:  You have new or worsening bleeding during a bowel movement.  You have new or increased blood in your stool.  You have a change in bowel habits.  You lose weight for no known reason. Summary  Colon polyps are tissue growths inside the colon, which is part of the large intestine. They are one type of polyp that can grow in the body.  Most colon polyps are noncancerous (benign), but some can become cancerous over time.  This condition is diagnosed with a colonoscopy.  This condition is treated by removing any polyps that are found. Most polyps can be removed during a colonoscopy. This information is not intended to replace advice given to you by your health care provider. Make sure you discuss any questions you have with your health care provider. Document Revised: 01/17/2020 Document Reviewed: 01/17/2020 Elsevier Patient Education  2021 [place].  Colonoscopy, Adult, Care After This sheet gives you information about how to care for yourself after your procedure. Your doctor may also give you more specific instructions. If you have  problems or questions, call your doctor. What can I expect after the procedure? After the procedure, it is common to have:  A small amount of blood in your poop (stool) for 24 hours.  Some gas.  Mild cramping or bloating in your belly (abdomen). Follow these instructions at home: Eating and drinking  Drink enough fluid to keep your pee (urine) pale yellow.  Follow instructions from your doctor about what you cannot eat or drink.  Return to your normal diet as told by  your doctor. Avoid heavy or fried foods that are hard to digest.   Activity  Rest as told by your doctor.  Do not sit for a long time without moving. Get up to take short walks every 1-2 hours. This is important. Ask for help if you feel weak or unsteady.  Return to your normal activities as told by your doctor. Ask your doctor what activities are safe for you. To help cramping and bloating:  Try walking around.  Put heat on your belly as told by your doctor. Use the heat source that your doctor recommends, such as a moist heat pack or a heating pad. ? Put a towel between your skin and the heat source. ? Leave the heat on for 20-30 minutes. ? Remove the heat if your skin turns bright red. This is very important if you are unable to feel pain, heat, or cold. You may have a greater risk of getting burned.   General instructions  If you were given a medicine to help you relax (sedative) during your procedure, it can affect you for many hours. Do not drive or use machinery until your doctor says that it is safe.  For the first 24 hours after the procedure: ? Do not sign important documents. ? Do not drink alcohol. ? Do your daily activities more slowly than normal. ? Eat foods that are soft and easy to digest.  Take over-the-counter or prescription medicines only as told by your doctor.  Keep all follow-up visits as told by your doctor. This is important. Contact a doctor if:  You have blood in your poop 2-3  days after the procedure. Get help right away if:  You have more than a small amount of blood in your poop.  You see large clumps of tissue (blood clots) in your poop.  Your belly is swollen.  You feel like you may vomit (nauseous).  You vomit.  You have a fever.  You have belly pain that gets worse, and medicine does not help your pain. Summary  After the procedure, it is common to have a small amount of blood in your poop. You may also have mild cramping and bloating in your belly.  If you were given a medicine to help you relax (sedative) during your procedure, it can affect you for many hours. Do not drive or use machinery until your doctor says that it is safe.  Get help right away if you have a lot of blood in your poop, feel like you may vomit, have a fever, or have more belly pain. This information is not intended to replace advice given to you by your health care provider. Make sure you discuss any questions you have with your health care provider. Document Revised: 08/04/2019 Document Reviewed: 04/24/2019 Elsevier Patient Education  [place].

## 2021-02-27 NOTE — H&P (Signed)
Sarah Hicks is an 60 y.o. female.   Chief Complaint: Patient is here for colonoscopy. HPI: Patient is 60 year old Caucasian female who underwent screening colonoscopy December 2014 with removal of small polyp from transverse colon there was a tubular adenoma.  She was advised to return in 7 years.  Her family history is negative for colorectal carcinoma.  She has no GI complaints.  She denies abdominal pain change in bowel habits or rectal bleeding. She is presently not taking any prescription medications.  Past Medical History:  Diagnosis Date  . Arthritis    oa  . Complication of anesthesia    told to breathe twice after a morton's neuroma surgery 2003, did fine with other surgeries since      . Headache   . History of kidney stones    passed on own  . Scoliosis   . Vitamin D deficiency 03/25/2016    Past Surgical History:  Procedure Laterality Date  . ANKLE SURGERY Right 10 year ago  . cervical cryoablation  1998  . CHOLECYSTECTOMY  17 years ago  . COLONOSCOPY N/A 09/13/2013   Procedure: COLONOSCOPY;  Surgeon: Rogene Houston, MD;  Location: [facility];  Service: Endoscopy;  Laterality: N/A;  730-moved to 55 Ann to notify pt  . FOOT SURGERY     Morton's neuroma  . IR URETERAL STENT LEFT NEW ACCESS W/O SEP NEPHROSTOMY CATH  09/19/2019  . KNEE SURGERY Left 10 years ago   torn ligemant and tendon repair  . NEPHROLITHOTOMY Left 09/19/2019   Procedure: NEPHROLITHOTOMY PERCUTANEOUS;  Surgeon: Irine Seal, MD;  Location: [facility];  Service: Urology;  Laterality: Left;  . TOTAL KNEE ARTHROPLASTY Right 10/25/2017   Procedure: RIGHT TOTAL KNEE ARTHROPLASTY;  Surgeon: Gaynelle Arabian, MD;  Location: [facility];  Service: Orthopedics;  Laterality: Right;  Adductor Block  . TUBAL LIGATION  19 years ago    Family History  Problem Relation Age of Onset  . Hypertension Mother   . Cancer Father        lung  . Hypertension Father   . Heart disease Maternal Grandmother        CHF  .  Hypertension Brother   . Hypertension Sister   . Breast cancer Neg Hx    Social History:  reports that she has never smoked. She has never used smokeless tobacco. She reports that she does not drink alcohol and does not use drugs.  Allergies:  Allergies  Allergen Reactions  . Latex Rash    Medications Prior to Admission  Medication Sig Dispense Refill  . acetaminophen (TYLENOL) 500 MG tablet Take 1,000 mg by mouth daily as needed for moderate pain or headache.    . Cholecalciferol 125 MCG (5000 UT) capsule Take 1 capsule (5,000 Units total) by mouth daily.    Marland Kitchen ibuprofen (ADVIL) 200 MG tablet Take 200-400 mg by mouth every 8 (eight) hours as needed for headache.    . loratadine (CLARITIN) 10 MG tablet Take 10 mg by mouth daily as needed for allergies.     . Multiple Vitamins-Minerals (ONE-A-DAY 50 PLUS PO) Take 1 tablet by mouth daily.     . Probiotic Product (PROBIOTIC DAILY PO) Take 1 capsule by mouth daily.      No results found for this or any previous visit (from the past 48 hour(s)). No results found.  Review of Systems  Blood pressure (!) 146/84, pulse (!) 109, temperature 98.6 F (37 C), temperature source Oral, resp. rate 12, height  5\' 4"  (1.626 m), weight 86.2 kg, last menstrual period 04/13/2013, SpO2 96 %. Physical Exam HENT:     Mouth/Throat:     Mouth: Mucous membranes are moist.     Pharynx: Oropharynx is clear.  Eyes:     General: No scleral icterus.    Conjunctiva/sclera: Conjunctivae normal.  Cardiovascular:     Rate and Rhythm: Normal rate and regular rhythm.     Heart sounds: Normal heart sounds. No murmur heard.   Pulmonary:     Effort: Pulmonary effort is normal.     Breath sounds: Normal breath sounds.  Abdominal:     General: There is no distension.     Palpations: Abdomen is soft. There is no mass.     Tenderness: There is no abdominal tenderness.  Musculoskeletal:        General: No swelling.     Cervical back: Neck supple.   Lymphadenopathy:     Cervical: No cervical adenopathy.  Skin:    General: Skin is warm and dry.  Neurological:     Mental Status: She is alert.      Assessment/Plan  History of colonic adenoma Surveillance colonoscopy.  Hildred Laser, MD 02/27/2021, 7:29 AM

## 2021-02-27 NOTE — Op Note (Signed)
[facility] Patient Name: Sarah Hicks Procedure Date: 02/27/2021 6:43 AM MRN: [id number] Date of Birth: November 03, 1960 Attending MD: Hildred Laser , MD CSN: [id number] Age: 60 Admit Type: Outpatient Procedure:                Colonoscopy Indications:              High risk colon cancer surveillance: Personal                            history of colonic polyps Providers:                Hildred Laser, MD, Lambert Mody, Raphael Gibney, Technician Referring MD:             Glenda Chroman, MD Medicines:                Meperidine 50 mg IV, Midazolam 10 mg IV Complications:            No immediate complications. Estimated Blood Loss:     Estimated blood loss was minimal. Procedure:                Pre-Anesthesia Assessment:                           - Prior to the procedure, a History and Physical                            was performed, and patient medications and                            allergies were reviewed. The patient's tolerance of                            previous anesthesia was also reviewed. The risks                            and benefits of the procedure and the sedation                            options and risks were discussed with the patient.                            All questions were answered, and informed consent                            was obtained. Prior Anticoagulants: The patient has                            taken no previous anticoagulant or antiplatelet                            agents except for NSAID medication. ASA Grade  Assessment: II - A patient with mild systemic                            disease. After reviewing the risks and benefits,                            the patient was deemed in satisfactory condition to                            undergo the procedure.                           After obtaining informed consent, the colonoscope                            was passed under  direct vision. Throughout the                            procedure, the patient's blood pressure, pulse, and                            oxygen saturations were monitored continuously. The                            [id number]-[id number] ([id number]) scope was introduced through                            the anus and advanced to the the cecum, identified                            by appendiceal orifice and ileocecal valve. The                            colonoscopy was somewhat difficult due to a                            redundant colon. The patient tolerated the                            procedure well. The quality of the bowel                            preparation was good. The ileocecal valve,                            appendiceal orifice, and rectum were photographed. Scope In: 7:46:25 AM Scope Out: 8:21:56 AM Scope Withdrawal Time: 0 hours 17 minutes 40 seconds  Total Procedure Duration: 0 hours 35 minutes 31 seconds  Findings:      The perianal and digital rectal examinations were normal.      A 8 mm polyp was found in the ileocecal valve. The polyp was flat. The       polyp was removed with a cold snare. Polyp resection was incomplete. The       resected tissue was  retrieved. Coagulation for destruction of remaining       portion of lesion using argon plasma was successful. The pathology       specimen was placed into Bottle Number 1.      A 3 mm polyp was found in the descending colon. The polyp was sessile.       The pathology specimen was placed into Bottle Number 2.      Internal hemorrhoids were found during retroflexion. The hemorrhoids       were small. Impression:               - One 8 mm polyp at the ileocecal valve, removed                            with a cold snare. Incomplete resection. Resected                            tissue retrieved. Residual polyp ablated with argon                            plasma coagulation ([facility]C).                           - One 3 mm polyp in the  descending colon.                           - Internal hemorrhoids. Moderate Sedation:      Moderate (conscious) sedation was administered by the endoscopy nurse       and supervised by the endoscopist. The following parameters were       monitored: oxygen saturation, heart rate, blood pressure, CO2       capnography and response to care. Total physician intraservice time was       42 minutes. Recommendation:           - Patient has a contact number available for                            emergencies. The signs and symptoms of potential                            delayed complications were discussed with the                            patient. Return to normal activities tomorrow.                            Written discharge instructions were provided to the                            patient.                           - Resume previous diet today.                           - Continue present medications.                           -  No aspirin, ibuprofen, naproxen, or other                            non-steroidal anti-inflammatory drugs for 3 days.                           - Await pathology results.                           - Repeat colonoscopy in 5 years for surveillance. Procedure Code(s):        --- Professional ---                           [phone], Colonoscopy, flexible; with removal of                            tumor(s), polyp(s), or other lesion(s) by snare                            technique                           99153, Moderate sedation; each additional 15                            minutes intraservice time                           99153, Moderate sedation; each additional 15                            minutes intraservice time                           G0500, Moderate sedation services provided by the                            same physician or other qualified health care                            professional performing a gastrointestinal                             endoscopic service that sedation supports,                            requiring the presence of an independent trained                            observer to assist in the monitoring of the                            patient's level of consciousness and physiological                            status; initial 15 minutes of intra-service time;  patient age 65 years or older (additional time may                            be reported with [phone], as appropriate) Diagnosis Code(s):        --- Professional ---                           Z86.010, Personal history of colonic polyps                           K63.5, Polyp of colon                           K64.8, Other hemorrhoids CPT copyright 2019 American Medical Association. All rights reserved. The codes documented in this report are preliminary and upon coder review may  be revised to meet current compliance requirements. Hildred Laser, MD Hildred Laser, MD 02/27/2021 8:32:58 AM This report has been signed electronically. Number of Addenda: 0

## 2021-02-28 LAB — SURGICAL PATHOLOGY

## 2021-03-04 ENCOUNTER — Other Ambulatory Visit: Payer: Self-pay

## 2021-03-04 ENCOUNTER — Ambulatory Visit (INDEPENDENT_AMBULATORY_CARE_PROVIDER_SITE_OTHER): Payer: 59 | Admitting: Adult Health

## 2021-03-04 ENCOUNTER — Encounter: Payer: Self-pay | Admitting: Adult Health

## 2021-03-04 VITALS — BP 150/90 | HR 67 | Ht 64.5 in | Wt 224.5 lb

## 2021-03-04 DIAGNOSIS — Z01419 Encounter for gynecological examination (general) (routine) without abnormal findings: Secondary | ICD-10-CM | POA: Insufficient documentation

## 2021-03-04 DIAGNOSIS — E7800 Pure hypercholesterolemia, unspecified: Secondary | ICD-10-CM

## 2021-03-04 DIAGNOSIS — Z1211 Encounter for screening for malignant neoplasm of colon: Secondary | ICD-10-CM

## 2021-03-04 DIAGNOSIS — Z0184 Encounter for antibody response examination: Secondary | ICD-10-CM

## 2021-03-04 LAB — HEMOCCULT GUIAC POC 1CARD (OFFICE): Fecal Occult Blood, POC: NEGATIVE

## 2021-03-04 NOTE — Progress Notes (Signed)
Patient ID: [name], female   DOB: 05-16-1961, 60 y.o.   MRN: [id number] History of Present Illness:  Shakedra is a 60 year old white female,married, PM in for a welll woman gyn exam. She requests labs and Covid antibody test. 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  Lab Results  Component Value Date   DIAGPAP  12/18/2019    - Negative for intraepithelial lesion or malignancy (NILM)   [place] Negative 12/18/2019  PCP is Dr Woody Seller  Current Medications, Allergies, Past Medical History, Past Surgical History, Family History and Social History were reviewed in [facility] record.     Review of Systems: Patient denies any headaches, hearing loss, fatigue, blurred vision, shortness of breath, chest pain, abdominal pain, problems with bowel movements, urination, or intercourse. No joint pain or mood swings.    Physical Exam:BP (!) 150/90 (BP Location: Left Arm, Patient Position: Sitting, Cuff Size: Large)   Pulse 67   Ht 5' 4.5" (1.638 m)   Wt 224 lb 8 oz (101.8 kg)   LMP 04/13/2013   BMI 37.94 kg/m  General:  Well developed, well nourished, no acute distress Skin:  Warm and dry Neck:  Midline trachea, normal thyroid, good ROM, no lymphadenopathy Lungs; Clear to auscultation bilaterally Breast:  No dominant palpable mass, retraction, or nipple discharge Cardiovascular: Regular rate and rhythm Abdomen:  Soft, non tender, no hepatosplenomegaly Pelvic:  External genitalia is normal in appearance, no lesions.  The vagina is normal in appearance, pale with loss of rugae. Urethra has no lesions or masses. The cervix is smooth.  Uterus is felt to be normal size, shape, and contour.  No adnexal masses or tenderness noted.Bladder is non tender, no masses felt. Rectal: Good sphincter tone, no polyps, or hemorrhoids felt.  Hemoccult negative. Extremities/musculoskeletal:  No swelling or varicosities noted, no clubbing or cyanosis Psych:  No mood changes, alert and  cooperative,seems happy AA is 0 Fall risk is low PHQ 9 score is 0 GAD 7 score is 0  Upstream - 03/04/21 0845      Pregnancy Intention Screening   Does the patient want to become pregnant in the next year? N/A    Does the patient's partner want to become pregnant in the next year? N/A    Would the patient like to discuss contraceptive options today? N/A      Contraception Wrap Up   Current Method No Method - Other Reason   postmenopausal   End Method No Method - Other Reason   postmenopausal   Contraception Counseling Provided No         Examination chaperoned by Levy Pupa LPN  Impression and Plan: 1. Encounter for well woman exam with routine gynecological exam Physical in 1 year Pap 2024 - CBC - Comprehensive metabolic panel - TSH - Lipid panel Mammogram in December Colonoscopy per GI, had recently   2. Elevated cholesterol - Lipid panel  3. Encounter for screening fecal occult blood testing - POCT occult blood stool  4. COVID-19 virus IgG antibody test result unknown - SAR CoV2 Serology (COVID 19)AB(IGG)IA

## 2021-03-05 ENCOUNTER — Encounter (HOSPITAL_COMMUNITY): Payer: Self-pay | Admitting: Internal Medicine

## 2021-03-05 LAB — COMPREHENSIVE METABOLIC PANEL
ALT: 32 IU/L (ref 0–32)
AST: 28 IU/L (ref 0–40)
Albumin/Globulin Ratio: 1.5 (ref 1.2–2.2)
Albumin: 4.3 g/dL (ref 3.8–4.9)
Alkaline Phosphatase: 83 IU/L (ref 44–121)
BUN/Creatinine Ratio: 14 (ref 9–23)
BUN: 10 mg/dL (ref 6–24)
Bilirubin Total: 0.3 mg/dL (ref 0.0–1.2)
CO2: 23 mmol/L (ref 20–29)
Calcium: 9.4 mg/dL (ref 8.7–10.2)
Chloride: 103 mmol/L (ref 96–106)
Creatinine, Ser: 0.74 mg/dL (ref 0.57–1.00)
Globulin, Total: 2.8 g/dL (ref 1.5–4.5)
Glucose: 91 mg/dL (ref 65–99)
Potassium: 5.1 mmol/L (ref 3.5–5.2)
Sodium: 140 mmol/L (ref 134–144)
Total Protein: 7.1 g/dL (ref 6.0–8.5)
eGFR: 93 mL/min/{1.73_m2} (ref >59)

## 2021-03-05 LAB — CBC
Hematocrit: 45 % (ref 34.0–46.6)
Hemoglobin: 14.6 g/dL (ref 11.1–15.9)
MCH: 28.8 pg (ref 26.6–33.0)
MCHC: 32.4 g/dL (ref 31.5–35.7)
MCV: 89 fL (ref 79–97)
Platelets: 294 10*3/uL (ref 150–450)
RBC: 5.07 x10E6/uL (ref 3.77–5.28)
RDW: 13.3 % (ref 11.7–15.4)
WBC: 8.7 10*3/uL (ref 3.4–10.8)

## 2021-03-05 LAB — LIPID PANEL
Chol/HDL Ratio: 4.1 ratio (ref 0.0–4.4)
Cholesterol, Total: 203 mg/dL — ABNORMAL HIGH (ref 100–199)
HDL: 49 mg/dL (ref >39)
LDL Chol Calc (NIH): 101 mg/dL — ABNORMAL HIGH (ref 0–99)
Triglycerides: 315 mg/dL — ABNORMAL HIGH (ref 0–149)
VLDL Cholesterol Cal: 53 mg/dL — ABNORMAL HIGH (ref 5–40)

## 2021-03-05 LAB — TSH: TSH: 3.21 u[IU]/mL (ref 0.450–4.500)

## 2021-03-05 LAB — SAR COV2 SEROLOGY (COVID19)AB(IGG),IA
SARS-CoV-2 Semi-Quant IgG Ab: <13 AU/mL (ref <13.0)
SARS-CoV-2 Spike Ab Interp: NEGATIVE

## 2021-03-06 ENCOUNTER — Telehealth: Payer: Self-pay | Admitting: Adult Health

## 2021-03-06 ENCOUNTER — Encounter (INDEPENDENT_AMBULATORY_CARE_PROVIDER_SITE_OTHER): Payer: Self-pay | Admitting: *Deleted

## 2021-03-06 NOTE — Telephone Encounter (Signed)
Left message about labs, need to work on triglycerides and consider meds but can talk with Dr Woody Seller first. I know you take fish oil already

## 2021-09-02 ENCOUNTER — Other Ambulatory Visit: Payer: Self-pay | Admitting: Adult Health

## 2021-09-02 DIAGNOSIS — Z1231 Encounter for screening mammogram for malignant neoplasm of breast: Secondary | ICD-10-CM

## 2021-10-07 ENCOUNTER — Ambulatory Visit
Admission: RE | Admit: 2021-10-07 | Discharge: 2021-10-07 | Disposition: A | Discharge status: Home / Self Care | Payer: 59 | Source: Ambulatory Visit | Attending: Adult Health | Admitting: Adult Health

## 2021-10-07 DIAGNOSIS — Z1231 Encounter for screening mammogram for malignant neoplasm of breast: Secondary | ICD-10-CM

## 2022-02-17 ENCOUNTER — Telehealth: Payer: Self-pay | Admitting: Adult Health

## 2022-02-17 DIAGNOSIS — E782 Mixed hyperlipidemia: Secondary | ICD-10-CM

## 2022-02-17 DIAGNOSIS — Z01419 Encounter for gynecological examination (general) (routine) without abnormal findings: Secondary | ICD-10-CM

## 2022-02-17 NOTE — Telephone Encounter (Signed)
Patient called stating that she would like for Anderson Malta to place lab order in for her to go to [facility] before her appointment on 03/25/2022 for her pap and physical. Please contact pt when lab order's are placed in ?

## 2022-02-18 DIAGNOSIS — E782 Mixed hyperlipidemia: Secondary | ICD-10-CM | POA: Insufficient documentation

## 2022-02-18 NOTE — Telephone Encounter (Signed)
Pt aware labs in. ?

## 2022-02-18 NOTE — Addendum Note (Signed)
Addended by: Derrek Monaco A on: 02/18/2022 12:49 PM ? ? Modules accepted: Orders ? ?

## 2022-03-25 ENCOUNTER — Encounter: Payer: Self-pay | Admitting: Adult Health

## 2022-03-25 ENCOUNTER — Ambulatory Visit (INDEPENDENT_AMBULATORY_CARE_PROVIDER_SITE_OTHER): Payer: 59 | Admitting: Adult Health

## 2022-03-25 VITALS — BP 143/82 | HR 72 | Ht 64.5 in | Wt 227.0 lb

## 2022-03-25 DIAGNOSIS — Z01419 Encounter for gynecological examination (general) (routine) without abnormal findings: Secondary | ICD-10-CM | POA: Diagnosis not present

## 2022-03-25 DIAGNOSIS — Z1211 Encounter for screening for malignant neoplasm of colon: Secondary | ICD-10-CM | POA: Diagnosis not present

## 2022-03-25 LAB — HEMOCCULT GUIAC POC 1CARD (OFFICE): Fecal Occult Blood, POC: NEGATIVE

## 2022-03-25 NOTE — Progress Notes (Signed)
Patient ID: Sarah Hicks, female   DOB: 06/09/1961, 61 y.o.   MRN: [id number] History of Present Illness: Sarah Hicks is a 61 year old white female, married, PM in for a well woman gyn exam and she had fasting labs before visit. PCP is Dr Woody Seller. Lab Results  Component Value Date   DIAGPAP  12/18/2019    - Negative for intraepithelial lesion or malignancy (NILM)   [place] Negative 12/18/2019      Current Medications, Allergies, Past Medical History, Past Surgical History, Family History and Social History were reviewed in [facility] record.     Review of Systems: Patient denies any headaches, hearing loss, fatigue, blurred vision, shortness of breath, chest pain, abdominal pain, problems with bowel movements, urination, or intercourse. No  mood swings.  Has pain left knee, needs replacement, had right done   Denies any vaginal bleeding.    Physical Exam:BP (!) 143/82 (BP Location: Right Arm, Patient Position: Sitting, Cuff Size: Normal)   Pulse 72   Ht 5' 4.5" (1.638 m)   Wt 227 lb (103 kg)   LMP 04/13/2013   BMI 38.36 kg/m   General:  Well developed, well nourished, no acute distress Skin:  Warm and dry Neck:  Midline trachea, normal thyroid, good ROM, no lymphadenopathy, no carotid bruits heard  Lungs; Clear to auscultation bilaterally Breast:  No dominant palpable mass, retraction, or nipple discharge Cardiovascular: Regular rate and rhythm Abdomen:  Soft, non tender, no hepatosplenomegaly Pelvic:  External genitalia is normal in appearance, no lesions.  The vagina is pale with loss of moisture and rugae. Urethra has no lesions or masses. The cervix is smooth.  Uterus is felt to be normal size, shape, and contour.  No adnexal masses or tenderness noted.Bladder is non tender, no masses felt. Rectal: Good sphincter tone, no polyps, or hemorrhoids felt.  Hemoccult negative. Extremities/musculoskeletal:  No swelling or varicosities noted, no clubbing or  cyanosis Psych:  No mood changes, alert and cooperative,seems happy AA is 0 Fall risk is low    03/25/2022   10:06 AM 03/04/2021    8:37 AM 12/18/2019    9:11 AM  Depression screen PHQ 2/9  Decreased Interest 0 0 0  Down, Depressed, Hopeless 0 0 0  PHQ - 2 Score 0 0 0  Altered sleeping 0 0   Tired, decreased energy 0 0   Change in appetite 0 0   Feeling bad or failure about yourself  0 0   Trouble concentrating 0 0   Moving slowly or fidgety/restless 0 0   Suicidal thoughts 0 0   PHQ-9 Score 0 0        03/25/2022   10:07 AM 03/04/2021    8:38 AM  GAD 7 : Generalized Anxiety Score  Nervous, Anxious, on Edge 0 0  Control/stop worrying 0 0  Worry too much - different things 0 0  Trouble relaxing 0 0  Restless 0 0  Easily annoyed or irritable 0 0  Afraid - awful might happen 0 0  Total GAD 7 Score 0 0      Upstream - 03/25/22 1021       Pregnancy Intention Screening   Does the patient want to become pregnant in the next year? No    Does the patient's partner want to become pregnant in the next year? No    Would the patient like to discuss contraceptive options today? No      Contraception Wrap Up   Current  Method Female Sterilization    End Method Female Sterilization    Contraception Counseling Provided No            Examination chaperoned by Levy Pupa LPN  Impression and Plan:  1. Encounter for well woman exam with routine gynecological exam Pap and physical in 1 year Had labs this morning Mammogram yearly Colonoscopy 2027 She is trying to lose weight, 1 lb per week as recommended by PCP  2. Encounter for screening fecal occult blood testing Hemoccult negative

## 2022-03-26 LAB — COMPREHENSIVE METABOLIC PANEL
ALT: 29 IU/L (ref 0–32)
AST: 30 IU/L (ref 0–40)
Albumin/Globulin Ratio: 2 (ref 1.2–2.2)
Albumin: 4.5 g/dL (ref 3.8–4.9)
Alkaline Phosphatase: 93 IU/L (ref 44–121)
BUN/Creatinine Ratio: 12 (ref 12–28)
BUN: 10 mg/dL (ref 8–27)
Bilirubin Total: 0.3 mg/dL (ref 0.0–1.2)
CO2: 22 mmol/L (ref 20–29)
Calcium: 9.3 mg/dL (ref 8.7–10.3)
Chloride: 102 mmol/L (ref 96–106)
Creatinine, Ser: 0.82 mg/dL (ref 0.57–1.00)
Globulin, Total: 2.2 g/dL (ref 1.5–4.5)
Glucose: 94 mg/dL (ref 70–99)
Potassium: 4.5 mmol/L (ref 3.5–5.2)
Sodium: 139 mmol/L (ref 134–144)
Total Protein: 6.7 g/dL (ref 6.0–8.5)
eGFR: 82 mL/min/{1.73_m2} (ref >59)

## 2022-03-26 LAB — CBC
Hematocrit: 43.2 % (ref 34.0–46.6)
Hemoglobin: 14.4 g/dL (ref 11.1–15.9)
MCH: 29.1 pg (ref 26.6–33.0)
MCHC: 33.3 g/dL (ref 31.5–35.7)
MCV: 87 fL (ref 79–97)
Platelets: 286 10*3/uL (ref 150–450)
RBC: 4.94 x10E6/uL (ref 3.77–5.28)
RDW: 13.1 % (ref 11.7–15.4)
WBC: 8.6 10*3/uL (ref 3.4–10.8)

## 2022-03-26 LAB — LIPID PANEL
Chol/HDL Ratio: 3.9 ratio (ref 0.0–4.4)
Cholesterol, Total: 209 mg/dL — ABNORMAL HIGH (ref 100–199)
HDL: 53 mg/dL (ref >39)
LDL Chol Calc (NIH): 109 mg/dL — ABNORMAL HIGH (ref 0–99)
Triglycerides: 273 mg/dL — ABNORMAL HIGH (ref 0–149)
VLDL Cholesterol Cal: 47 mg/dL — ABNORMAL HIGH (ref 5–40)

## 2022-09-21 ENCOUNTER — Other Ambulatory Visit: Payer: Self-pay | Admitting: Adult Health

## 2022-09-21 DIAGNOSIS — Z1231 Encounter for screening mammogram for malignant neoplasm of breast: Secondary | ICD-10-CM

## 2022-10-14 ENCOUNTER — Ambulatory Visit
Admission: RE | Admit: 2022-10-14 | Discharge: 2022-10-14 | Disposition: A | Discharge status: Home / Self Care | Payer: 59 | Source: Ambulatory Visit | Attending: Adult Health | Admitting: Adult Health

## 2022-10-14 DIAGNOSIS — Z1231 Encounter for screening mammogram for malignant neoplasm of breast: Secondary | ICD-10-CM

## 2023-03-30 ENCOUNTER — Other Ambulatory Visit (HOSPITAL_COMMUNITY)
Admission: RE | Admit: 2023-03-30 | Discharge: 2023-03-30 | Disposition: A | Discharge status: Home / Self Care | Payer: 59 | Source: Ambulatory Visit | Attending: Adult Health | Admitting: Adult Health

## 2023-03-30 ENCOUNTER — Encounter: Payer: Self-pay | Admitting: Adult Health

## 2023-03-30 ENCOUNTER — Ambulatory Visit (INDEPENDENT_AMBULATORY_CARE_PROVIDER_SITE_OTHER): Payer: 59 | Admitting: Adult Health

## 2023-03-30 VITALS — BP 146/93 | HR 75 | Ht 65.0 in | Wt 238.5 lb

## 2023-03-30 DIAGNOSIS — Z131 Encounter for screening for diabetes mellitus: Secondary | ICD-10-CM | POA: Diagnosis not present

## 2023-03-30 DIAGNOSIS — E782 Mixed hyperlipidemia: Secondary | ICD-10-CM | POA: Diagnosis not present

## 2023-03-30 DIAGNOSIS — Z1211 Encounter for screening for malignant neoplasm of colon: Secondary | ICD-10-CM | POA: Diagnosis not present

## 2023-03-30 DIAGNOSIS — R030 Elevated blood-pressure reading, without diagnosis of hypertension: Secondary | ICD-10-CM

## 2023-03-30 DIAGNOSIS — Z01419 Encounter for gynecological examination (general) (routine) without abnormal findings: Secondary | ICD-10-CM | POA: Diagnosis present

## 2023-03-30 LAB — HEMOCCULT GUIAC POC 1CARD (OFFICE): Fecal Occult Blood, POC: NEGATIVE

## 2023-03-30 NOTE — Progress Notes (Signed)
Patient ID: Sarah Hicks, female   DOB: 1961-05-13, 62 y.o.   MRN: [id number] History of Present Illness: Sarah Hicks is a 62 year old white female, married, PM in for a well woman gyn exam and pap. She has had kidney stones and see Dr Annabell Howells. She is caring for her mom who is 90 and a BIL.  She was treated with antibiotic and steroid recently and has not felt right since taking.   PCP is Dr Sherril Croon.   Current Medications, Allergies, Past Medical History, Past Surgical History, Family History and Social History were reviewed in [facility] record.     Review of Systems: Patient denies any headaches, hearing loss, fatigue, blurred vision, shortness of breath, chest pain, abdominal pain, problems with bowel movements, urination, or intercourse. No joint pain or mood swings.  Denies any vaginal bleeding.   Physical Exam:BP (!) 146/93 (BP Location: Right Arm, Patient Position: Sitting, Cuff Size: Large)   Pulse 75   Ht 5\' 5"  (1.651 m)   Wt 238 lb 8 oz (108.2 kg)   LMP 04/13/2013   BMI 39.69 kg/m   General:  Well developed, well nourished, no acute distress Skin:  Warm and dry Neck:  Midline trachea, normal thyroid, good ROM, no lymphadenopathy Lungs; Clear to auscultation bilaterally Breast:  No dominant palpable mass, retraction, or nipple discharge Cardiovascular: Regular rate and rhythm Abdomen:  Soft, non tender, no hepatosplenomegaly Pelvic:  External genitalia is normal in appearance, no lesions.  The vagina is pale. Urethra has no lesions or masses. The cervix is smooth, pap with HR HPV genotyping performed.  Uterus is felt to be normal size, shape, and contour.  No adnexal masses or tenderness noted.Bladder is non tender, no masses felt. Rectal: Good sphincter tone, no polyps, or hemorrhoids felt.  Hemoccult negative. Extremities/musculoskeletal:  No swelling or varicosities noted, no clubbing or cyanosis Psych:  No mood changes, alert and cooperative,seems happy AA  is 0 Fall risk is low    03/30/2023    8:44 AM 03/25/2022   10:06 AM 03/04/2021    8:37 AM  Depression screen PHQ 2/9  Decreased Interest 0 0 0  Down, Depressed, Hopeless 0 0 0  PHQ - 2 Score 0 0 0  Altered sleeping 0 0 0  Tired, decreased energy 0 0 0  Change in appetite 0 0 0  Feeling bad or failure about yourself  0 0 0  Trouble concentrating 0 0 0  Moving slowly or fidgety/restless 0 0 0  Suicidal thoughts 0 0 0  PHQ-9 Score 0 0 0       03/30/2023    8:44 AM 03/25/2022   10:07 AM 03/04/2021    8:38 AM  GAD 7 : Generalized Anxiety Score  Nervous, Anxious, on Edge 0 0 0  Control/stop worrying 0 0 0  Worry too much - different things 0 0 0  Trouble relaxing 0 0 0  Restless 0 0 0  Easily annoyed or irritable 0 0 0  Afraid - awful might happen 0 0 0  Total GAD 7 Score 0 0 0      Upstream - 03/30/23 0848       Pregnancy Intention Screening   Does the patient want to become pregnant in the next year? No    Does the patient's partner want to become pregnant in the next year? No    Would the patient like to discuss contraceptive options today? No      Contraception  Wrap Up   Current Method Female Sterilization    End Method Female Sterilization    Contraception Counseling Provided No             Examination chaperoned by Malachy Mood LPN  Impression and Plan: 1. Encounter for gynecological examination with Papanicolaou smear of cervix Pap sent Pap in 3 years if normal Physical in 1 year Mammogram was negative 10/14/22 Colonoscopy per GI  Will check labs  - Cytology - PAP( [place]) - CBC - Comprehensive metabolic panel - Lipid panel  2. Encounter for screening fecal occult blood testing Hemoccult was negative - POCT occult blood stool  3. Elevated cholesterol with elevated triglycerides - Lipid panel  4. Screening for diabetes mellitus - Hemoglobin A1c  5. Elevated BP without diagnosis of hypertension She was in hurry this morning Can follow up  with PCP

## 2023-03-31 ENCOUNTER — Other Ambulatory Visit: Payer: Self-pay | Admitting: Adult Health

## 2023-03-31 LAB — COMPREHENSIVE METABOLIC PANEL
ALT: 36 IU/L — ABNORMAL HIGH (ref 0–32)
AST: 37 IU/L (ref 0–40)
Albumin: 4.7 g/dL (ref 3.9–4.9)
Alkaline Phosphatase: 91 IU/L (ref 44–121)
BUN/Creatinine Ratio: 12 (ref 12–28)
BUN: 10 mg/dL (ref 8–27)
Bilirubin Total: 0.3 mg/dL (ref 0.0–1.2)
CO2: 20 mmol/L (ref 20–29)
Calcium: 9.4 mg/dL (ref 8.7–10.3)
Chloride: 102 mmol/L (ref 96–106)
Creatinine, Ser: 0.81 mg/dL (ref 0.57–1.00)
Globulin, Total: 2.6 g/dL (ref 1.5–4.5)
Glucose: 98 mg/dL (ref 70–99)
Potassium: 4.6 mmol/L (ref 3.5–5.2)
Sodium: 140 mmol/L (ref 134–144)
Total Protein: 7.3 g/dL (ref 6.0–8.5)
eGFR: 83 mL/min/{1.73_m2} (ref >59)

## 2023-03-31 LAB — LIPID PANEL
Chol/HDL Ratio: 4.1 ratio (ref 0.0–4.4)
Cholesterol, Total: 227 mg/dL — ABNORMAL HIGH (ref 100–199)
HDL: 56 mg/dL (ref >39)
LDL Chol Calc (NIH): 113 mg/dL — ABNORMAL HIGH (ref 0–99)
Triglycerides: 335 mg/dL — ABNORMAL HIGH (ref 0–149)
VLDL Cholesterol Cal: 58 mg/dL — ABNORMAL HIGH (ref 5–40)

## 2023-03-31 LAB — CBC
Hematocrit: 45.9 % (ref 34.0–46.6)
Hemoglobin: 15.1 g/dL (ref 11.1–15.9)
MCH: 29.2 pg (ref 26.6–33.0)
MCHC: 32.9 g/dL (ref 31.5–35.7)
MCV: 89 fL (ref 79–97)
Platelets: 310 10*3/uL (ref 150–450)
RBC: 5.18 x10E6/uL (ref 3.77–5.28)
RDW: 13.4 % (ref 11.7–15.4)
WBC: 9.3 10*3/uL (ref 3.4–10.8)

## 2023-03-31 LAB — HEMOGLOBIN A1C
Est. average glucose Bld gHb Est-mCnc: 123 mg/dL
Hgb A1c MFr Bld: 5.9 % — ABNORMAL HIGH (ref 4.8–5.6)

## 2023-03-31 LAB — CYTOLOGY - PAP
Comment: NEGATIVE
Diagnosis: NEGATIVE
High risk HPV: NEGATIVE

## 2023-03-31 MED ORDER — ROSUVASTATIN CALCIUM 10 MG PO TABS: ORAL_TABLET | ORAL | 3 refills | Status: DC

## 2023-09-24 ENCOUNTER — Inpatient Hospital Stay (HOSPITAL_COMMUNITY)
Admission: EM | Admit: 2023-09-24 | Discharge: 2023-10-05 | DRG: 372 | Disposition: A | Discharge status: Home / Self Care | Payer: 59 | Attending: Surgery | Admitting: Surgery

## 2023-09-24 ENCOUNTER — Encounter (HOSPITAL_COMMUNITY): Payer: Self-pay

## 2023-09-24 ENCOUNTER — Other Ambulatory Visit: Payer: Self-pay

## 2023-09-24 DIAGNOSIS — R000 Tachycardia, unspecified: Secondary | ICD-10-CM | POA: Diagnosis present

## 2023-09-24 DIAGNOSIS — Z87442 Personal history of urinary calculi: Secondary | ICD-10-CM | POA: Diagnosis not present

## 2023-09-24 DIAGNOSIS — M419 Scoliosis, unspecified: Secondary | ICD-10-CM | POA: Diagnosis present

## 2023-09-24 DIAGNOSIS — K381 Appendicular concretions: Secondary | ICD-10-CM | POA: Diagnosis present

## 2023-09-24 DIAGNOSIS — Z79899 Other long term (current) drug therapy: Secondary | ICD-10-CM

## 2023-09-24 DIAGNOSIS — K5900 Constipation, unspecified: Secondary | ICD-10-CM | POA: Diagnosis present

## 2023-09-24 DIAGNOSIS — Z91048 Other nonmedicinal substance allergy status: Secondary | ICD-10-CM | POA: Diagnosis not present

## 2023-09-24 DIAGNOSIS — K3532 Acute appendicitis with perforation and localized peritonitis, without abscess: Secondary | ICD-10-CM | POA: Diagnosis present

## 2023-09-24 DIAGNOSIS — Z825 Family history of asthma and other chronic lower respiratory diseases: Secondary | ICD-10-CM

## 2023-09-24 DIAGNOSIS — Z885 Allergy status to narcotic agent status: Secondary | ICD-10-CM | POA: Diagnosis not present

## 2023-09-24 DIAGNOSIS — N200 Calculus of kidney: Secondary | ICD-10-CM | POA: Diagnosis present

## 2023-09-24 DIAGNOSIS — Z96651 Presence of right artificial knee joint: Secondary | ICD-10-CM | POA: Diagnosis present

## 2023-09-24 DIAGNOSIS — Z6839 Body mass index (BMI) 39.0-39.9, adult: Secondary | ICD-10-CM

## 2023-09-24 DIAGNOSIS — K3533 Acute appendicitis with perforation and localized peritonitis, with abscess: Principal | ICD-10-CM | POA: Diagnosis present

## 2023-09-24 DIAGNOSIS — Z823 Family history of stroke: Secondary | ICD-10-CM | POA: Diagnosis not present

## 2023-09-24 DIAGNOSIS — Z801 Family history of malignant neoplasm of trachea, bronchus and lung: Secondary | ICD-10-CM

## 2023-09-24 DIAGNOSIS — Z9049 Acquired absence of other specified parts of digestive tract: Secondary | ICD-10-CM | POA: Diagnosis not present

## 2023-09-24 DIAGNOSIS — E871 Hypo-osmolality and hyponatremia: Secondary | ICD-10-CM | POA: Diagnosis present

## 2023-09-24 DIAGNOSIS — B3731 Acute candidiasis of vulva and vagina: Secondary | ICD-10-CM | POA: Diagnosis present

## 2023-09-24 DIAGNOSIS — R82991 Hypocitraturia: Secondary | ICD-10-CM | POA: Diagnosis present

## 2023-09-24 DIAGNOSIS — E785 Hyperlipidemia, unspecified: Secondary | ICD-10-CM | POA: Diagnosis present

## 2023-09-24 DIAGNOSIS — M1990 Unspecified osteoarthritis, unspecified site: Secondary | ICD-10-CM | POA: Diagnosis present

## 2023-09-24 DIAGNOSIS — Z8249 Family history of ischemic heart disease and other diseases of the circulatory system: Secondary | ICD-10-CM | POA: Diagnosis not present

## 2023-09-24 DIAGNOSIS — E876 Hypokalemia: Secondary | ICD-10-CM | POA: Diagnosis present

## 2023-09-24 DIAGNOSIS — K219 Gastro-esophageal reflux disease without esophagitis: Secondary | ICD-10-CM | POA: Diagnosis present

## 2023-09-24 DIAGNOSIS — E860 Dehydration: Secondary | ICD-10-CM | POA: Diagnosis present

## 2023-09-24 DIAGNOSIS — E669 Obesity, unspecified: Secondary | ICD-10-CM | POA: Diagnosis present

## 2023-09-24 DIAGNOSIS — Z91040 Latex allergy status: Secondary | ICD-10-CM

## 2023-09-24 LAB — CBC WITH DIFFERENTIAL/PLATELET
Abs Immature Granulocytes: 0.13 10*3/uL — ABNORMAL HIGH (ref 0.00–0.07)
Basophils Absolute: 0 10*3/uL (ref 0.0–0.1)
Basophils Relative: 0 %
Eosinophils Absolute: 0 10*3/uL (ref 0.0–0.5)
Eosinophils Relative: 0 %
HCT: 43 % (ref 36.0–46.0)
Hemoglobin: 14.3 g/dL (ref 12.0–15.0)
Immature Granulocytes: 1 %
Lymphocytes Relative: 9 %
Lymphs Abs: 2 10*3/uL (ref 0.7–4.0)
MCH: 29.7 pg (ref 26.0–34.0)
MCHC: 33.3 g/dL (ref 30.0–36.0)
MCV: 89.4 fL (ref 80.0–100.0)
Monocytes Absolute: 1.1 10*3/uL — ABNORMAL HIGH (ref 0.1–1.0)
Monocytes Relative: 5 %
Neutro Abs: 18.8 10*3/uL — ABNORMAL HIGH (ref 1.7–7.7)
Neutrophils Relative %: 85 %
Platelets: 240 10*3/uL (ref 150–400)
RBC: 4.81 MIL/uL (ref 3.87–5.11)
RDW: 13.3 % (ref 11.5–15.5)
WBC: 22.1 10*3/uL — ABNORMAL HIGH (ref 4.0–10.5)
nRBC: 0 % (ref 0.0–0.2)

## 2023-09-24 LAB — COMPREHENSIVE METABOLIC PANEL
ALT: 70 U/L — ABNORMAL HIGH (ref 0–44)
AST: 68 U/L — ABNORMAL HIGH (ref 15–41)
Albumin: 3.8 g/dL (ref 3.5–5.0)
Alkaline Phosphatase: 107 U/L (ref 38–126)
Anion gap: 11 (ref 5–15)
BUN: 8 mg/dL (ref 8–23)
CO2: 21 mmol/L — ABNORMAL LOW (ref 22–32)
Calcium: 8.7 mg/dL — ABNORMAL LOW (ref 8.9–10.3)
Chloride: 93 mmol/L — ABNORMAL LOW (ref 98–111)
Creatinine, Ser: 0.86 mg/dL (ref 0.44–1.00)
GFR, Estimated: >60 mL/min (ref >60)
Glucose, Bld: 127 mg/dL — ABNORMAL HIGH (ref 70–99)
Potassium: 3.5 mmol/L (ref 3.5–5.1)
Sodium: 125 mmol/L — ABNORMAL LOW (ref 135–145)
Total Bilirubin: 2 mg/dL — ABNORMAL HIGH (ref <1.2)
Total Protein: 8.1 g/dL (ref 6.5–8.1)

## 2023-09-24 LAB — LIPASE, BLOOD: Lipase: 24 U/L (ref 11–51)

## 2023-09-24 MED ORDER — HYDROMORPHONE HCL 1 MG/ML IJ SOLN
0.5000 mg | INTRAMUSCULAR | Status: DC | PRN
  Administered 2023-09-25 – 2023-10-01 (×9): 0.5 mg via INTRAVENOUS
  Filled 2023-09-24 (×10): qty 0.5

## 2023-09-24 MED ORDER — SODIUM CHLORIDE 0.9 % IV SOLN
INTRAVENOUS | Status: AC
  Administered 2023-09-24 – 2023-09-25 (×2): 100 mL/h via INTRAVENOUS

## 2023-09-24 MED ORDER — PANTOPRAZOLE SODIUM 40 MG IV SOLR
40.0000 mg | Freq: Every day | INTRAVENOUS | Status: DC
  Administered 2023-09-24 – 2023-09-29 (×6): 40 mg via INTRAVENOUS
  Filled 2023-09-24 (×6): qty 10

## 2023-09-24 MED ORDER — METRONIDAZOLE 500 MG/100ML IV SOLN
500.0000 mg | Freq: Once | INTRAVENOUS | Status: AC
  Administered 2023-09-24: 500 mg via INTRAVENOUS
  Filled 2023-09-24: qty 100

## 2023-09-24 MED ORDER — ONDANSETRON HCL 4 MG/2ML IJ SOLN
4.0000 mg | Freq: Four times a day (QID) | INTRAMUSCULAR | Status: DC | PRN
  Filled 2023-09-24: qty 2

## 2023-09-24 MED ORDER — SODIUM CHLORIDE 0.9 % IV SOLN
2.0000 g | Freq: Once | INTRAVENOUS | Status: AC
  Administered 2023-09-24: 2 g via INTRAVENOUS
  Filled 2023-09-24: qty 20

## 2023-09-24 MED ORDER — HYDROMORPHONE HCL 1 MG/ML IJ SOLN
0.5000 mg | Freq: Once | INTRAMUSCULAR | Status: AC
  Administered 2023-09-24: 0.5 mg via INTRAVENOUS
  Filled 2023-09-24: qty 1

## 2023-09-24 MED ORDER — ONDANSETRON 4 MG PO TBDP: 4.0000 mg | ORAL_TABLET | Freq: Four times a day (QID) | ORAL | Status: DC | PRN

## 2023-09-24 MED ORDER — PIPERACILLIN-TAZOBACTAM 3.375 G IVPB
3.3750 g | Freq: Three times a day (TID) | INTRAVENOUS | Status: AC
  Administered 2023-09-24 – 2023-10-01 (×21): 3.375 g via INTRAVENOUS
  Filled 2023-09-24 (×21): qty 50

## 2023-09-24 MED ORDER — SODIUM CHLORIDE 0.9 % IV BOLUS
30.0000 mL/kg | Freq: Once | INTRAVENOUS | Status: AC
  Administered 2023-09-24: 2334 mL via INTRAVENOUS

## 2023-09-24 MED ORDER — MORPHINE SULFATE (PF) 2 MG/ML IV SOLN: 1.0000 mg | INTRAVENOUS | Status: DC | PRN

## 2023-09-24 NOTE — H&P (Signed)
Sarah Hicks is an 62 y.o. female.   Chief Complaint: Abdominal pain HPI: The patient is a 62 year old white female who began having abdominal pain about 2 days ago.  The pain was localized to the right lower quadrant.  She has had some nausea associated with it.  She came to the ER where a CT scan showed evidence of possible perforated appendicitis but no appendicolith.  She is otherwise in good health and does not smoke.  Past Medical History:  Diagnosis Date   Arthritis    oa   Complication of anesthesia    told to breathe twice after a morton's neuroma surgery 2003, did fine with other surgeries since   GERD (gastroesophageal reflux disease)    Headache    History of kidney stones    passed on own   Kidney stones    Scoliosis    Vitamin D deficiency 03/25/2016    Past Surgical History:  Procedure Laterality Date   ANKLE SURGERY Right 10 year ago   cervical cryoablation  1998   CHOLECYSTECTOMY  17 years ago   COLONOSCOPY N/A 09/13/2013   Procedure: COLONOSCOPY;  Surgeon: Malissa Hippo, MD;  [facility]: [facility];  Service: Endoscopy;  Laterality: N/A;  730-moved to 820 Ann to notify pt   COLONOSCOPY N/A 02/27/2021   Procedure: COLONOSCOPY;  Surgeon: Malissa Hippo, MD;  [facility]: [facility];  Service: Endoscopy;  Laterality: N/A;  am   FOOT SURGERY     Morton's neuroma   HOT HEMOSTASIS  02/27/2021   Procedure: HOT HEMOSTASIS (ARGON PLASMA COAGULATION/BICAP);  Surgeon: Malissa Hippo, MD;  [facility];  Service: Endoscopy;;   IR URETERAL STENT LEFT NEW ACCESS W/O SEP NEPHROSTOMY CATH  09/19/2019   KNEE SURGERY Left 10 years ago   torn ligemant and tendon repair   NEPHROLITHOTOMY Left 09/19/2019   Procedure: NEPHROLITHOTOMY PERCUTANEOUS;  Surgeon: Bjorn Pippin, MD;  Location: [facility];  Service: Urology;  Laterality: Left;   POLYPECTOMY  02/27/2021   Procedure: POLYPECTOMY;  Surgeon: Malissa Hippo, MD;  [facility];  Service: Endoscopy;;   TOTAL  KNEE ARTHROPLASTY Right 10/25/2017   Procedure: RIGHT TOTAL KNEE ARTHROPLASTY;  Surgeon: Ollen Gross, MD;  Location: [facility];  Service: Orthopedics;  Laterality: Right;  Adductor Block   TUBAL LIGATION  19 years ago    Family History  Problem Relation Age of Onset   Hypertension Mother    Cancer Father        lung   Hypertension Father    Heart disease Maternal Grandmother        CHF   Hypertension Brother    Hypertension Sister    Breast cancer Neg Hx    Social History:  reports that she has never smoked. She has never used smokeless tobacco. She reports that she does not drink alcohol and does not use drugs.  Allergies:  Allergies  Allergen Reactions   Latex Rash    (Not in a hospital admission)   Results for orders placed or performed during the hospital encounter of 09/24/23 (from the past 48 hours)  Comprehensive metabolic panel     Status: Abnormal   Collection Time: 09/24/23  5:14 PM  Result Value Ref Range   Sodium 125 (L) 135 - 145 mmol/L   Potassium 3.5 3.5 - 5.1 mmol/L   Chloride 93 (L) 98 - 111 mmol/L   CO2 21 (L) 22 - 32 mmol/L   Glucose, Bld 127 (  H) 70 - 99 mg/dL    Comment: Glucose reference range applies only to samples taken after fasting for at least 8 hours.   BUN 8 8 - 23 mg/dL   Creatinine, Ser 2.95 0.44 - 1.00 mg/dL   Calcium 8.7 (L) 8.9 - 10.3 mg/dL   Total Protein 8.1 6.5 - 8.1 g/dL   Albumin 3.8 3.5 - 5.0 g/dL   AST 68 (H) 15 - 41 U/L   ALT 70 (H) 0 - 44 U/L   Alkaline Phosphatase 107 38 - 126 U/L   Total Bilirubin 2.0 (H) <1.2 mg/dL   GFR, Estimated >62 >13 mL/min    Comment: (NOTE) Calculated using the CKD-EPI Creatinine Equation (2021)    Anion gap 11 5 - 15    Comment: Performed at [facility], [address] [place], [place] [address]  CBC with Differential     Status: Abnormal   Collection Time: 09/24/23  5:14 PM  Result Value Ref Range   WBC 22.1 (H) 4.0 - 10.5 K/uL   RBC 4.81 3.87 - 5.11 MIL/uL    Hemoglobin 14.3 12.0 - 15.0 g/dL   HCT 84.6 96.2 - 95.2 %   MCV 89.4 80.0 - 100.0 fL   MCH 29.7 26.0 - 34.0 pg   MCHC 33.3 30.0 - 36.0 g/dL   RDW 84.1 32.4 - 40.1 %   Platelets 240 150 - 400 K/uL   nRBC 0.0 0.0 - 0.2 %   Neutrophils Relative % 85 %   Neutro Abs 18.8 (H) 1.7 - 7.7 K/uL   Lymphocytes Relative 9 %   Lymphs Abs 2.0 0.7 - 4.0 K/uL   Monocytes Relative 5 %   Monocytes Absolute 1.1 (H) 0.1 - 1.0 K/uL   Eosinophils Relative 0 %   Eosinophils Absolute 0.0 0.0 - 0.5 K/uL   Basophils Relative 0 %   Basophils Absolute 0.0 0.0 - 0.1 K/uL   Immature Granulocytes 1 %   Abs Immature Granulocytes 0.13 (H) 0.00 - 0.07 K/uL    Comment: Performed at [facility], [address], [place], [place] [address]  Lipase, blood     Status: None   Collection Time: 09/24/23  5:14 PM  Result Value Ref Range   Lipase 24 11 - 51 U/L    Comment: Performed at [facility], [address] [place], [place] [address]   No results found.  Review of Systems  Constitutional: Negative.   HENT: Negative.    Eyes: Negative.   Respiratory: Negative.    Cardiovascular: Negative.   Gastrointestinal:  Positive for abdominal pain, nausea and vomiting.  Endocrine: Negative.   Genitourinary: Negative.   Musculoskeletal: Negative.   Skin: Negative.   Allergic/Immunologic: Negative.   Neurological: Negative.   Hematological: Negative.   Psychiatric/Behavioral: Negative.      Blood pressure 136/87, pulse (!) 116, temperature 99.6 F (37.6 C), temperature source Oral, resp. rate 20, height 5\' 5"  (1.651 m), weight 108.9 kg, last menstrual period 04/13/2013, SpO2 92%. Physical Exam Vitals reviewed.  Constitutional:      Appearance: Normal appearance. She is obese.  HENT:     Head: Normocephalic and atraumatic.     Right Ear: External ear normal.     Left Ear: External ear normal.     Nose: Nose normal.     Mouth/Throat:     Mouth: Mucous membranes are moist.      Pharynx: Oropharynx is clear.  Eyes:  General: No scleral icterus.    Extraocular Movements: Extraocular movements intact.     Conjunctiva/sclera: Conjunctivae normal.     Pupils: Pupils are equal, round, and reactive to light.  Cardiovascular:     Rate and Rhythm: Tachycardia present.     Pulses: Normal pulses.     Heart sounds: Normal heart sounds.  Pulmonary:     Effort: Pulmonary effort is normal. No respiratory distress.     Breath sounds: Normal breath sounds.  Abdominal:     Palpations: Abdomen is soft.     Comments: There is moderate focal tenderness in the right lower quadrant  Musculoskeletal:        General: No swelling or deformity. Normal range of motion.     Cervical back: Normal range of motion and neck supple.  Skin:    General: Skin is warm and dry.     Coloration: Skin is not jaundiced.  Neurological:     General: No focal deficit present.     Mental Status: She is alert and oriented to person, place, and time.  Psychiatric:        Mood and Affect: Mood normal.        Behavior: Behavior normal.      Assessment/Plan The patient appears to have an acute appendicitis with evidence of contained perforation.  At this point we will admit her to the hospital and start her on broad-spectrum antibiotic therapy.  If she improves then she could go home on antibiotics and plan for interval appendectomy in 6 to 8 weeks.  If she does not improve then she may require surgery to remove the appendix.  I have discussed with her the risks and benefits of the surgery and this course of care and she understands and is in agreement  Chevis Pretty III, MD 09/24/2023, 5:54 PM

## 2023-09-24 NOTE — ED Triage Notes (Signed)
Pt arrived reporting she was sent by MD for appendicitis. Patient has abdominal pain RLQ x2 days with nausea, diarrhea and fever. No other symptoms. CT scan was done this morning

## 2023-09-24 NOTE — ED Provider Triage Note (Signed)
Emergency Medicine Provider Triage Evaluation Note  Sarah Hicks , a 62 y.o. female  was evaluated in triage.  Pt complains of abdominal pain, nausea, and diarrhea.  Patient had fever of 102F last night.  Patient was sent by her doctor with concern for appendicitis.  She reports she had a CT scan done today ordered by Dr. Annabell Howells.   Review of Systems  Positive: As above Negative: As above  Physical Exam  BP (!) 172/101 (BP Location: Right Arm)   Pulse (!) 135   Temp 99.6 F (37.6 C) (Oral)   Resp 18   Ht 5\' 5"  (1.651 m)   Wt 108.9 kg   LMP 04/13/2013   SpO2 94%   BMI 39.94 kg/m  Gen:   Awake, no distress   Resp:  Normal effort  MSK:   Moves extremities without difficulty  Other:  RLQ abdominal tenderness  Medical Decision Making  Medically screening exam initiated at 3:46 PM.  Appropriate orders placed.  Sarah Hicks was informed that the remainder of the evaluation will be completed by another provider, this initial triage assessment does not replace that evaluation, and the importance of remaining in the ED until their evaluation is complete.     Lenard Simmer, [place] 09/24/23 1549

## 2023-09-24 NOTE — Plan of Care (Signed)

## 2023-09-24 NOTE — ED Provider Notes (Signed)
[place] EMERGENCY DEPARTMENT AT [facility] Provider Note  CSN: [id number] Arrival date & time: 09/24/23 1457  Chief Complaint(s) Abdominal Pain  HPI Sarah Hicks is a 62 y.o. female history of kidney stones, hyperlipidemia presenting to the emergency department with abdominal pain.  The patient saw her urologist today as she has been having a couple days of abdominal pain.  She reports that pain is worse yesterday, improved today.  She denies any nausea or vomiting, reports intermittent diarrhea.  She has had fevers and chills at home.  She has decreased appetite.  Her urologist obtained a CT scan which demonstrated appendicitis with a 3 cm abscess.  He referred her to the emergency department for further management.   Past Medical History Past Medical History:  Diagnosis Date   Arthritis    oa   Complication of anesthesia    told to breathe twice after a morton's neuroma surgery 2003, did fine with other surgeries since   GERD (gastroesophageal reflux disease)    Headache    History of kidney stones    passed on own   Kidney stones    Scoliosis    Vitamin D deficiency 03/25/2016   Patient Active Problem List   Diagnosis Date Noted   Appendicitis with perforation 09/24/2023   Elevated BP without diagnosis of hypertension 03/30/2023   Elevated cholesterol with elevated triglycerides 02/18/2022   Encounter for well woman exam with routine gynecological exam 03/04/2021   Encounter for screening fecal occult blood testing 03/04/2021   Encounter for gynecological examination with Papanicolaou smear of cervix 12/18/2019   Screening for colorectal cancer 12/18/2019   Vitamin D insufficiency 12/18/2019   Left renal stone 09/19/2019   Screening for diabetes mellitus 09/05/2018   Elevated cholesterol 09/05/2018   OA (osteoarthritis) of knee 10/25/2017   Well woman exam with routine gynecological exam 06/16/2017   Vitamin D deficiency 03/25/2016   Home  Medication(s) Prior to Admission medications   Medication Sig Start Date End Date Taking? Authorizing Provider  acetaminophen (TYLENOL) 500 MG tablet Take 1,000 mg by mouth daily as needed for moderate pain or headache.    [provider]  Cholecalciferol 125 MCG (5000 UT) capsule Take 1 capsule (5,000 Units total) by mouth daily. 09/06/18   Adline Potter, NP  loratadine (CLARITIN) 10 MG tablet Take 10 mg by mouth daily as needed for allergies.     [provider]  Multiple Vitamins-Minerals (ONE-A-DAY 50 PLUS PO) Take 1 tablet by mouth daily.     [provider]  Omega-3 Fatty Acids (FISH OIL PO) Take by mouth.    [provider]  Probiotic Product (PROBIOTIC DAILY PO) Take 1 capsule by mouth daily.    [provider]  rosuvastatin (CRESTOR) 10 MG tablet Take 1 tablet daily at bedtime 03/31/23   Adline Potter, NP  Past Surgical History Past Surgical History:  Procedure Laterality Date   ANKLE SURGERY Right 10 year ago   cervical cryoablation  1998   CHOLECYSTECTOMY  17 years ago   COLONOSCOPY N/A 09/13/2013   Procedure: COLONOSCOPY;  Surgeon: Malissa Hippo, MD;  [facility];  Service: Endoscopy;  Laterality: N/A;  730-moved to 820 Ann to notify pt   COLONOSCOPY N/A 02/27/2021   Procedure: COLONOSCOPY;  Surgeon: Malissa Hippo, MD;  [facility];  Service: Endoscopy;  Laterality: N/A;  am   FOOT SURGERY     Morton's neuroma   HOT HEMOSTASIS  02/27/2021   Procedure: HOT HEMOSTASIS (ARGON PLASMA COAGULATION/BICAP);  Surgeon: Malissa Hippo, MD;  [facility];  Service: Endoscopy;;   IR URETERAL STENT LEFT NEW ACCESS W/O SEP NEPHROSTOMY CATH  09/19/2019   KNEE SURGERY Left 10 years ago   torn ligemant and tendon repair   NEPHROLITHOTOMY Left 09/19/2019   Procedure:  NEPHROLITHOTOMY PERCUTANEOUS;  Surgeon: Bjorn Pippin, MD;  Location: [facility];  Service: Urology;  Laterality: Left;   POLYPECTOMY  02/27/2021   Procedure: POLYPECTOMY;  Surgeon: Malissa Hippo, MD;  [facility];  Service: Endoscopy;;   TOTAL KNEE ARTHROPLASTY Right 10/25/2017   Procedure: RIGHT TOTAL KNEE ARTHROPLASTY;  Surgeon: Ollen Gross, MD;  [facility];  Service: Orthopedics;  Laterality: Right;  Adductor Block   TUBAL LIGATION  19 years ago   Family History Family History  Problem Relation Age of Onset   Hypertension Mother    Cancer Father        lung   Hypertension Father    Heart disease Maternal Grandmother        CHF   Hypertension Brother    Hypertension Sister    Breast cancer Neg Hx     Social History Social History   Tobacco Use   Smoking status: Never   Smokeless tobacco: Never  Vaping Use   Vaping status: Never Used  Substance Use Topics   Alcohol use: No   Drug use: No   Allergies Latex  Review of Systems Review of Systems  All other systems reviewed and are negative.   Physical Exam Vital Signs  I have reviewed the triage vital signs BP 136/87   Pulse (!) 116   Temp 99.6 F (37.6 C) (Oral)   Resp 20   Ht 5\' 5"  (1.651 m)   Wt 108.9 kg   LMP 04/13/2013   SpO2 92%   BMI 39.94 kg/m  Physical Exam Vitals and nursing note reviewed.  Constitutional:      General: She is not in acute distress.    Appearance: She is well-developed.  HENT:     Head: Normocephalic and atraumatic.     Mouth/Throat:     Mouth: Mucous membranes are dry.  Eyes:     Pupils: Pupils are equal, round, and reactive to light.  Cardiovascular:     Rate and Rhythm: Regular rhythm. Tachycardia present.     Heart sounds: No murmur heard. Pulmonary:     Effort: Pulmonary effort is normal. No respiratory distress.     Breath sounds: Normal breath sounds.  Abdominal:     General: Abdomen is flat.     Palpations: Abdomen is soft.     Tenderness:  There is abdominal tenderness in the right lower quadrant.  Musculoskeletal:        General: No tenderness.     Right lower leg: No  edema.     Left lower leg: No edema.  Skin:    General: Skin is warm and dry.  Neurological:     General: No focal deficit present.     Mental Status: She is alert. Mental status is at baseline.  Psychiatric:        Mood and Affect: Mood normal.        Behavior: Behavior normal.     ED Results and Treatments Labs (all labs ordered are listed, but only abnormal results are displayed) Labs Reviewed  COMPREHENSIVE METABOLIC PANEL - Abnormal; Notable for the following components:      Result Value   Sodium 125 (*)    Chloride 93 (*)    CO2 21 (*)    Glucose, Bld 127 (*)    Calcium 8.7 (*)    AST 68 (*)    ALT 70 (*)    Total Bilirubin 2.0 (*)    All other components within normal limits  CBC WITH DIFFERENTIAL/PLATELET - Abnormal; Notable for the following components:   WBC 22.1 (*)    Neutro Abs 18.8 (*)    Monocytes Absolute 1.1 (*)    Abs Immature Granulocytes 0.13 (*)    All other components within normal limits  LIPASE, BLOOD                                                                                                                          Radiology No results found.  Pertinent labs & imaging results that were available during my care of the patient were reviewed by me and considered in my medical decision making (see MDM for details).  Medications Ordered in ED Medications  metroNIDAZOLE (FLAGYL) IVPB 500 mg (500 mg Intravenous New Bag/Given 09/24/23 1800)  sodium chloride 0.9 % bolus 2,334 mL (2,334 mLs Intravenous New Bag/Given 09/24/23 1711)  cefTRIAXone (ROCEPHIN) 2 g in sodium chloride 0.9 % 100 mL IVPB (0 g Intravenous Stopped 09/24/23 1759)  HYDROmorphone (DILAUDID) injection 0.5 mg (0.5 mg Intravenous Given 09/24/23 1711)                                                                                                                                      Procedures Procedures  (including critical care time)  Medical Decision Making / ED Course   MDM:  63 year old female presenting to the emergency department with abdominal pain.  Patient is well-appearing, her physical exam is notable for right lower quadrant tenderness.  Reviewed outside CT which demonstrates 3 cm abscess.  Reviewed radiology report.  Does not show any other findings.  Her symptoms are consistent with appendicitis.  Will order antibiotics.  She is tachycardic and appears mildly dehydrated so we will give fluids also.  Not febrile in the emergency department.  Discussed with Dr. Carolynne Edouard with surgery who is aware of the patient being here and will take a look.  She will need to be admitted.  Clinical Course as of 09/24/23 1817  Fri Sep 24, 2023  1816 Patient admitted to surgery  [WS]    Clinical Course User Index [WS] Lonell Grandchild, MD     Additional history obtained: -Additional history obtained from spouse -External records from outside source obtained and reviewed including: Chart review including previous notes, labs, imaging, consultation notes including urology note, outside CT scan    Lab Tests: -I ordered, reviewed, and interpreted labs.   The pertinent results include:   Labs Reviewed  COMPREHENSIVE METABOLIC PANEL - Abnormal; Notable for the following components:      Result Value   Sodium 125 (*)    Chloride 93 (*)    CO2 21 (*)    Glucose, Bld 127 (*)    Calcium 8.7 (*)    AST 68 (*)    ALT 70 (*)    Total Bilirubin 2.0 (*)    All other components within normal limits  CBC WITH DIFFERENTIAL/PLATELET - Abnormal; Notable for the following components:   WBC 22.1 (*)    Neutro Abs 18.8 (*)    Monocytes Absolute 1.1 (*)    Abs Immature Granulocytes 0.13 (*)    All other components within normal limits  LIPASE, BLOOD    Notable for hyponatremia, leukocytosis   EKG   EKG Interpretation Date/Time:  Friday  September 24 2023 15:48:48 EST Ventricular Rate:  132 PR Interval:  151 QRS Duration:  77 QT Interval:  300 QTC Calculation: 445 R Axis:   -14  Text Interpretation: Sinus tachycardia Left ventricular hypertrophy Anterior Q waves, possibly due to LVH Confirmed by Alvino Blood ([id number]) on 09/24/2023 5:06:04 PM          Medicines ordered and prescription drug management: Meds ordered this encounter  Medications   sodium chloride 0.9 % bolus 2,334 mL   cefTRIAXone (ROCEPHIN) 2 g in sodium chloride 0.9 % 100 mL IVPB    Antibiotic Indication::   Intra-abdominal   metroNIDAZOLE (FLAGYL) IVPB 500 mg    Antibiotic Indication::   Intra-abdominal Infection   HYDROmorphone (DILAUDID) injection 0.5 mg    -I have reviewed the patients home medicines and have made adjustments as needed   Consultations Obtained: I requested consultation with the general surgeon,  and discussed lab and imaging findings as well as pertinent plan - they recommend: admission   Cardiac Monitoring: The patient was maintained on a cardiac monitor.  I personally viewed and interpreted the cardiac monitored which showed an underlying rhythm of: NSR  Social Determinants of Health:  Diagnosis or treatment significantly limited by social determinants of health: obesity   Reevaluation: After the interventions noted above, I reevaluated the patient and found that their symptoms have improved  Co morbidities that complicate the patient evaluation  Past Medical History:  Diagnosis Date   Arthritis    oa   Complication of anesthesia    told to breathe twice after a morton's neuroma surgery 2003,  did fine with other surgeries since   GERD (gastroesophageal reflux disease)    Headache    History of kidney stones    passed on own   Kidney stones    Scoliosis    Vitamin D deficiency 03/25/2016      Dispostion: Disposition decision including need for hospitalization was considered, and patient admitted to  the hospital.    Final Clinical Impression(s) / ED Diagnoses Final diagnoses:  Acute appendicitis with perforation, localized peritonitis, and abscess, unspecified whether gangrene present     This chart was dictated using voice recognition software.  Despite best efforts to proofread,  errors can occur which can change the documentation meaning.    Lonell Grandchild, MD 09/24/23 [phone]

## 2023-09-24 NOTE — Consult Note (Signed)
This is my office note from 09/24/23.   The CT films should be accessible in the PACS system, but not listed in the EPIC results tab.   CC: Abdominal pain.    09/24/23: Sarah Hicks returns today in f/u. She had the onset Weds of mid upper abdominal pain that was fairly severe without nausea. The pain has moved to the RLP and she has had a fever to 102.1 but it was 100.5 at last check. She took gas-x which has helped. Her UA is unremarkable today.   04/27/23: Sarah Hicks returns today in f/u for her history of stones. She has started [facility] but was off for a month with pneumonia. She is back on it and has noticed some sand passing. She has no hematuria. She has increased her fluids. 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 She had a CT prior to this visit that shows an 8mm LLP stone with no obstruction and it is 1000HU.   10/30/22: Sarah Hicks returns today in f/u. She has had no flank pain or hematuria. She has no voiding symptoms. KUB is clear. Renal US shows small bilateral stones up to 3.58mm which is some progression. Her UA has some bacteria but epithelial cells as well. She has no incontinence.   10/20/21: Sarah Hicks returns today in f/u. She has been doing well without flank pain or hematuria other some very mild chronic flank discomfort. She has an unremarkable urine. KUB is clear today. Renal US shows no hydro but there are a couple possible small stones bilaterally.   05/20/20: Sarah Hicks returns today in f/u for history of stones with a left PCNL on 09/19/19. She has done well without left pain or hematuria. She has some chronic intermittent mild right flank pain that persists that is unrelated to her stones. Renal US shows no hydro but there is a possible 4mm midpole stone. KUB is unremarkable. Her UA is clear. The stone was calcium oxalate.   02/27/2020: Returns today to discuss further recent 24 hour urinalysis results as part of metabolic evaluation for stone prevention.   Currently  without symptoms. No interval recurrence of pain/discomfort. Denies any interval stone material passage. Voiding symptoms stable with non bothersome frequency or urgency. No difficulty with starting stream. No interval burning or painful urination, visible blood in the urine. She tells me she has already made efforts at trying to increase hydration as previously recommended.     ALLERGIES: Latex    MEDICATIONS: Coq10  Litholyte 1 PO Daily  Multivitamin  Probiotic  Vitamin D3     GU PSH: PCNL, Left - 2020     NON-GU PSH: Ankle Arthroscopy/surgery, Right Bilateral Tubal Ligation Cholecystectomy (laparoscopic) Foot surgery (unspecified), Right Knee replacement Leg surgery (unspecified), Left Visit Complexity (formerly GPC1X) - 04/27/2023     GU PMH: Renal calculus, She has a stable 8mm LLP stone. I will have her return in 6months with a KUB and RUS. I will get a repeat 24 hour urine to assess the impact of the litholyte. - 04/27/2023, - 04/09/2023, She has some increase in the size stones. I will have her return in 6 months with a CT to clarify her stone burden. , - 10/30/2022, She has possible small bilateral renal stones. I will just have her return in 1 year with imaging. , - 2023, She has a couple of <36mm possible calcifications on RUS and a clear KUB. She will return in 1 year with a KUB and renal US. , -  2021, - 2021, she has a residual 4mm left renal stone on [place] and that was present on her pretreatment CT is most consistent with a randall's plaque. I am going to do a metabolic w/u and will have her return in 6months with a KUB and renal US. Dietary recommendations reviewed and handouts given., - 2021, Left, She has a 2cm left renal pelvic stone with hematuria. I discussed the options again in detail including PCNL which has the highest probabiity of rendering her stone free, ESWL which is the least invasive option and URS. She has been reviewing the options online and is most interested in  URS, which I think is reasonable as long as she understands the need for a stent and possible multiple procedures with a lower stone free rate than PCNL. She would like to proceed with URS. I have reviewed the risks of ureteroscopy including bleeding, infection, ureteral injury, need for a stent or secondary procedures, thrombotic events and anesthetic complications. 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 , - 10/30/2022, - 2023, - 2021 Arthritis GERD Sleep Apnea    FAMILY HISTORY: 1 Daughter - Other 1 son - Other Arthritis - Runs in Family Asthma - Runs in Family Hypertension - Runs in Family Lung Cancer - Father nephrolithiasis - Runs in Family Prostate Cancer - Runs in Family stroke - Grandmother   SOCIAL HISTORY: Marital Status: Married Preferred Language: English; Race: White Current Smoking Status: Patient has never smoked.   Tobacco Use Assessment Completed: Used Tobacco in last 30 days? Drinks 1 caffeinated drink per day.    REVIEW OF SYSTEMS:    GU Review Female:   Patient denies frequent urination, hard to postpone urination, burning /pain with urination, get up at night to urinate, leakage of urine, stream starts and stops, trouble starting your stream, have to strain to urinate, and being pregnant.  Gastrointestinal (Upper):   Patient denies nausea, vomiting, and indigestion/ heartburn.  Gastrointestinal (Lower):   Patient denies constipation and diarrhea.  Constitutional:   Patient reports fever. Patient denies night sweats, weight loss, and fatigue.  Skin:    Patient denies skin rash/ lesion and itching.  Eyes:   Patient denies blurred vision and double vision.  Ears/ Nose/ Throat:   Patient denies sore throat and sinus problems.  Hematologic/Lymphatic:   Patient denies swollen glands and easy bruising.  Cardiovascular:   Patient denies leg swelling and chest pains.  Respiratory:   Patient denies cough and shortness of breath.  Endocrine:   Patient denies excessive thirst.  Musculoskeletal:   Patient denies back pain and joint pain.  Neurological:   Patient denies headaches and dizziness.  Psychologic:   Patient denies depression and anxiety.   Notes: Flank pain    VITAL SIGNS:      09/24/2023 09:51 AM  Weight 198 lb / 89.81 kg  Height 65 in / 165.1 cm  BP 138/85 mmHg  Heart Rate 121 /min  Temperature 98.4 F / 36.8 C  BMI 32.9 kg/m   MULTI-SYSTEM PHYSICAL EXAMINATION:    Constitutional: Well-nourished. No physical deformities.  Normally developed. Good grooming.  Gastrointestinal: Abdominal tenderness, that is diffuse but greatest in the RLP with guarding and rebound., obese. No mass, no rigidity.      Complexity of Data:  Records Review:   Previous Patient Records  Urine Test Review:   Urinalysis   PROCEDURES:         C.T. ABD-Pelv w/cm - 16109  She has marked inflammatory changes in the RLQ and a dilated, fluid filled appendix with thickening and narrowing of the adjacent ilium.        Patient confirmed No Neulasta OnPro Device.         Omnipaque 300 - [id number] OMNIPAQUE 300 (No Wastage)       ASSESSMENT:      ICD-10 Details  1 GU:   Renal calculus - N20.0 Minor  2   RLQ pain - R10.31 Acute, Systemic Symptoms  3   Periumbilical pain - R10.33 Acute, Systemic Symptoms  4 NON-GU:   Acute appendicitis with generalized peritonitis, without perforation or abscess - K35.200 Acute, Threat to Bodily Function - She appears to have acute appendicitis with a dilated appendix and peritoneal signs. I will send her to the  ER for further evaluation and treatment. ER notified. General Surgery notified.    PLAN:           Orders Labs BUN/Creatinine(Stat), CBC with Diff  X-Rays: C.T. Abdomen/Pelvis With I.V. Contrast - Oral and IV to r/o appendicitis.           Schedule Return Visit/Planned Activity: Return PRN             Note: She is to go right to the [facility] ER for further evaluation and treatment.           Document

## 2023-09-24 NOTE — ED Notes (Signed)
ED TO INPATIENT HANDOFF REPORT  Name/Age/Gender Sarah Hicks 62 y.o. female  Code Status    Code Status Orders  (From admission, onward)           Start     Ordered   09/24/23 1752  Full code  Continuous       Question:  By:  Answer:  Consent: discussion documented in EHR   09/24/23 1754           Code Status History     Date Active Date Inactive Code Status Order ID Comments User Context   09/19/2019 1803 09/20/2019 1837 Full Code [id number]  Bjorn Pippin, MD Inpatient   10/25/2017 1013 10/27/2017 1722 Full Code [id number]  Ollen Gross, MD Inpatient       Home/SNF/Other Home  Chief Complaint Appendicitis with perforation [K35.32]  Level of Care/Admitting Diagnosis ED Disposition     ED Disposition  Admit   Condition  --   Comment  Hospital Area: [facility] [[id number]]  Level of Care: Med-Surg [16]  May admit patient to [facility] or [facility] if equivalent level of care is available:: No  Covid Evaluation: Asymptomatic - no recent exposure (last 10 days) testing not required  Diagnosis: Appendicitis with perforation [440102]  Admitting Physician: CCS, MD [[id number]]  Attending Physician: CCS, MD [[id number]]  Certification:: I certify this patient will need inpatient services for at least 2 midnights  Expected Medical Readiness: 09/29/2023          Medical History Past Medical History:  Diagnosis Date   Arthritis    oa   Complication of anesthesia    told to breathe twice after a morton's neuroma surgery 2003, did fine with other surgeries since   GERD (gastroesophageal reflux disease)    Headache    History of kidney stones    passed on own   Kidney stones    Scoliosis    Vitamin D deficiency 03/25/2016    Allergies Allergies  Allergen Reactions   Latex Rash    IV Location/Drains/Wounds Patient Lines/Drains/Airways Status     Active Line/Drains/Airways     Name Placement date Placement time Site Days   Peripheral IV  09/24/23 20 G 2.5" Left Antecubital 09/24/23  1710  Antecubital  less than 1   Nephrostomy Left 22 Fr. 09/19/19  1245  Left  1466            Labs/Imaging Results for orders placed or performed during the hospital encounter of 09/24/23 (from the past 48 hours)  Comprehensive metabolic panel     Status: Abnormal   Collection Time: 09/24/23  5:14 PM  Result Value Ref Range   Sodium 125 (L) 135 - 145 mmol/L   Potassium 3.5 3.5 - 5.1 mmol/L   Chloride 93 (L) 98 - 111 mmol/L   CO2 21 (L) 22 - 32 mmol/L   Glucose, Bld 127 (H) 70 - 99 mg/dL    Comment: Glucose reference range applies only to samples taken after fasting for at least 8 hours.   BUN 8 8 - 23 mg/dL   Creatinine, Ser 7.25 0.44 - 1.00 mg/dL   Calcium 8.7 (L) 8.9 - 10.3 mg/dL   Total Protein 8.1 6.5 - 8.1 g/dL   Albumin 3.8 3.5 - 5.0 g/dL   AST 68 (H) 15 - 41 U/L   ALT 70 (H) 0 - 44 U/L   Alkaline Phosphatase 107 38 - 126 U/L   Total Bilirubin 2.0 (H) <1.2 mg/dL  GFR, Estimated >60 >60 mL/min    Comment: (NOTE) Calculated using the CKD-EPI Creatinine Equation (2021)    Anion gap 11 5 - 15    Comment: Performed at [facility], [address] [place], [place] [address]  CBC with Differential     Status: Abnormal   Collection Time: 09/24/23  5:14 PM  Result Value Ref Range   WBC 22.1 (H) 4.0 - 10.5 K/uL   RBC 4.81 3.87 - 5.11 MIL/uL   Hemoglobin 14.3 12.0 - 15.0 g/dL   HCT 21.3 08.6 - 57.8 %   MCV 89.4 80.0 - 100.0 fL   MCH 29.7 26.0 - 34.0 pg   MCHC 33.3 30.0 - 36.0 g/dL   RDW 46.9 62.9 - 52.8 %   Platelets 240 150 - 400 K/uL   nRBC 0.0 0.0 - 0.2 %   Neutrophils Relative % 85 %   Neutro Abs 18.8 (H) 1.7 - 7.7 K/uL   Lymphocytes Relative 9 %   Lymphs Abs 2.0 0.7 - 4.0 K/uL   Monocytes Relative 5 %   Monocytes Absolute 1.1 (H) 0.1 - 1.0 K/uL   Eosinophils Relative 0 %   Eosinophils Absolute 0.0 0.0 - 0.5 K/uL   Basophils Relative 0 %   Basophils Absolute 0.0 0.0 - 0.1 K/uL   Immature  Granulocytes 1 %   Abs Immature Granulocytes 0.13 (H) 0.00 - 0.07 K/uL    Comment: Performed at [facility], [address] [place], [place] [address]  Lipase, blood     Status: None   Collection Time: 09/24/23  5:14 PM  Result Value Ref Range   Lipase 24 11 - 51 U/L    Comment: Performed at [facility], [address], [place], [place] [address]   No results found.  Pending Labs [employer] (From admission, onward)     Start     Ordered   Signed and Held  HIV Antibody (routine testing w rflx)  (HIV Antibody (Routine testing w reflex) panel)  Once,   R        Signed and Held   Signed and Held  Basic metabolic panel  Tomorrow morning,   R        Signed and Held   Signed and Held  CBC  Tomorrow morning,   R        Signed and Held            Vitals/Pain Today's Vitals   09/24/23 1542 09/24/23 1600 09/24/23 1715  BP: (!) 172/101  136/87  Pulse: (!) 135  (!) 116  Resp: 18  20  Temp: 99.6 F (37.6 C)    TempSrc: Oral    SpO2: 94%  92%  Weight: 108.9 kg    Height: 5\' 5"  (1.651 m)    PainSc:  10-Worst pain ever     Isolation Precautions No active isolations  Medications Medications  metroNIDAZOLE (FLAGYL) IVPB 500 mg (500 mg Intravenous New Bag/Given 09/24/23 1800)  sodium chloride 0.9 % bolus 2,334 mL (2,334 mLs Intravenous New Bag/Given 09/24/23 1711)  cefTRIAXone (ROCEPHIN) 2 g in sodium chloride 0.9 % 100 mL IVPB (0 g Intravenous Stopped 09/24/23 1759)  HYDROmorphone (DILAUDID) injection 0.5 mg (0.5 mg Intravenous Given 09/24/23 1711)    Mobility walks with person assist

## 2023-09-25 LAB — BASIC METABOLIC PANEL
Anion gap: 9 (ref 5–15)
BUN: 7 mg/dL — ABNORMAL LOW (ref 8–23)
CO2: 21 mmol/L — ABNORMAL LOW (ref 22–32)
Calcium: 7.9 mg/dL — ABNORMAL LOW (ref 8.9–10.3)
Chloride: 101 mmol/L (ref 98–111)
Creatinine, Ser: 0.72 mg/dL (ref 0.44–1.00)
GFR, Estimated: >60 mL/min (ref >60)
Glucose, Bld: 108 mg/dL — ABNORMAL HIGH (ref 70–99)
Potassium: 3.5 mmol/L (ref 3.5–5.1)
Sodium: 131 mmol/L — ABNORMAL LOW (ref 135–145)

## 2023-09-25 LAB — CBC
HCT: 39.5 % (ref 36.0–46.0)
Hemoglobin: 12.6 g/dL (ref 12.0–15.0)
MCH: 29.4 pg (ref 26.0–34.0)
MCHC: 31.9 g/dL (ref 30.0–36.0)
MCV: 92.3 fL (ref 80.0–100.0)
Platelets: 208 10*3/uL (ref 150–400)
RBC: 4.28 MIL/uL (ref 3.87–5.11)
RDW: 13.5 % (ref 11.5–15.5)
WBC: 18.1 10*3/uL — ABNORMAL HIGH (ref 4.0–10.5)
nRBC: 0 % (ref 0.0–0.2)

## 2023-09-25 LAB — HIV ANTIBODY (ROUTINE TESTING W REFLEX): HIV Screen 4th Generation wRfx: NONREACTIVE

## 2023-09-25 MED ORDER — ACETAMINOPHEN 10 MG/ML IV SOLN
1000.0000 mg | Freq: Four times a day (QID) | INTRAVENOUS | Status: AC | PRN
  Administered 2023-09-25 – 2023-09-26 (×2): 1000 mg via INTRAVENOUS
  Filled 2023-09-25 (×3): qty 100

## 2023-09-25 MED ORDER — ACETAMINOPHEN 10 MG/ML IV SOLN
1000.0000 mg | Freq: Four times a day (QID) | INTRAVENOUS | Status: DC
  Administered 2023-09-25: 1000 mg via INTRAVENOUS
  Filled 2023-09-25: qty 100

## 2023-09-25 MED ORDER — SODIUM CHLORIDE 0.9 % IV SOLN
INTRAVENOUS | Status: AC
Start: 2023-09-25 — End: 2023-09-26

## 2023-09-25 NOTE — Progress Notes (Signed)
Mobility Specialist - Progress Note   09/25/23 0911  Mobility  Activity Ambulated independently in hallway;Ambulated independently to bathroom  Level of Assistance Modified independent, requires aide device or extra time  Assistive Device Other (Comment) (IV Pole)  Distance Ambulated (ft) 480 ft  Activity Response Tolerated well  Mobility Referral Yes  Mobility visit 1 Mobility  Mobility Specialist Start Time (ACUTE ONLY) [id number]  Mobility Specialist Stop Time (ACUTE ONLY) 0910  Mobility Specialist Time Calculation (min) (ACUTE ONLY) 6 min   Pt received in bed and agreeable to mobility. No complaints during session. Pt to recliner after session with all needs met.   [facility]

## 2023-09-25 NOTE — Plan of Care (Signed)
  Problem: Education: Goal: Knowledge of General Education information will improve Description Including pain rating scale, medication(s)/side effects and non-pharmacologic comfort measures Outcome: Progressing   

## 2023-09-25 NOTE — Progress Notes (Signed)
   Subjective/Chief Complaint: States she may feel a little bit better than yesterday   Objective: Vital signs in last 24 hours: Temp:  [98.2 F (36.8 C)-99.6 F (37.6 C)] 98.9 F (37.2 C) (12/14 1000) Pulse Rate:  [95-135] 100 (12/14 1000) Resp:  [17-20] 20 (12/14 1000) BP: (136-172)/(75-101) 136/79 (12/14 1000) SpO2:  [92 %-100 %] 95 % (12/14 1000) Weight:  [108.9 kg] 108.9 kg (12/13 1542) Last BM Date : 09/24/23  Intake/Output from previous day: 12/13 0701 - 12/14 0700 In: 2990.2 [I.V.:388.8; IV Piggyback:2601.5] Out: 850 [Urine:850] Intake/Output this shift: Total I/O In: -  Out: 400 [Urine:400]  General appearance: alert and cooperative Resp: clear to auscultation bilaterally Cardio: regular rate and rhythm GI: Soft on the left side, focally tender on the right side.  Seems to be a little better than yesterday  Lab Results:  Recent Labs    09/24/23 1714 09/25/23 0531  WBC 22.1* 18.1*  HGB 14.3 12.6  HCT 43.0 39.5  PLT 240 208   BMET Recent Labs    09/24/23 1714 09/25/23 0531  NA 125* 131*  K 3.5 3.5  CL 93* 101  CO2 21* 21*  GLUCOSE 127* 108*  BUN 8 7*  CREATININE 0.86 0.72  CALCIUM 8.7* 7.9*   PT/INR No results for input(s): "LABPROT", "INR" in the last 72 hours. ABG No results for input(s): "PHART", "HCO3" in the last 72 hours.  Invalid input(s): "PCO2", "PO2"  Studies/Results: No results found.  Anti-infectives: Anti-infectives (From admission, onward)    Start     Dose/Rate Route Frequency Ordered Stop   09/24/23 2000  piperacillin-tazobactam (ZOSYN) IVPB 3.375 g        3.375 g 12.5 mL/hr over 240 Minutes Intravenous Every 8 hours 09/24/23 1843 10/01/23 2159   09/24/23 1645  cefTRIAXone (ROCEPHIN) 2 g in sodium chloride 0.9 % 100 mL IVPB        2 g 200 mL/hr over 30 Minutes Intravenous  Once 09/24/23 1637 09/24/23 1759   09/24/23 1645  metroNIDAZOLE (FLAGYL) IVPB 500 mg        500 mg 100 mL/hr over 60 Minutes Intravenous  Once  09/24/23 1637 09/24/23 1900       Assessment/Plan: s/p * No surgery found * Continue bowel rest. Continue IV Zosyn for perforated appendicitis Ambulate White count improving, will recheck white count tomorrow  LOS: 1 day    Chevis Pretty III 09/25/2023

## 2023-09-26 LAB — CBC
HCT: 36.9 % (ref 36.0–46.0)
Hemoglobin: 11.8 g/dL — ABNORMAL LOW (ref 12.0–15.0)
MCH: 29.6 pg (ref 26.0–34.0)
MCHC: 32 g/dL (ref 30.0–36.0)
MCV: 92.7 fL (ref 80.0–100.0)
Platelets: 226 10*3/uL (ref 150–400)
RBC: 3.98 MIL/uL (ref 3.87–5.11)
RDW: 13.8 % (ref 11.5–15.5)
WBC: 12.4 10*3/uL — ABNORMAL HIGH (ref 4.0–10.5)
nRBC: 0 % (ref 0.0–0.2)

## 2023-09-26 LAB — BASIC METABOLIC PANEL
Anion gap: 10 (ref 5–15)
BUN: 7 mg/dL — ABNORMAL LOW (ref 8–23)
CO2: 18 mmol/L — ABNORMAL LOW (ref 22–32)
Calcium: 8.1 mg/dL — ABNORMAL LOW (ref 8.9–10.3)
Chloride: 107 mmol/L (ref 98–111)
Creatinine, Ser: 0.54 mg/dL (ref 0.44–1.00)
GFR, Estimated: >60 mL/min (ref >60)
Glucose, Bld: 71 mg/dL (ref 70–99)
Potassium: 3.7 mmol/L (ref 3.5–5.1)
Sodium: 135 mmol/L (ref 135–145)

## 2023-09-26 NOTE — Progress Notes (Signed)
   Subjective/Chief Complaint: Feels a little better today   Objective: Vital signs in last 24 hours: Temp:  [97.7 F (36.5 C)-98.9 F (37.2 C)] 97.7 F (36.5 C) (12/15 0552) Pulse Rate:  [79-100] 79 (12/15 0552) Resp:  [18-20] 18 (12/15 0552) BP: (124-140)/(64-79) 129/73 (12/15 0552) SpO2:  [95 %-99 %] 98 % (12/15 0552) Last BM Date : 09/24/23  Intake/Output from previous day: 12/14 0701 - 12/15 0700 In: 2680.8 [I.V.:2330.8; IV Piggyback:350] Out: 1200 [Urine:1200] Intake/Output this shift: No intake/output data recorded.  General appearance: alert and cooperative Resp: clear to auscultation bilaterally Cardio: regular rate and rhythm GI: soft on left. Focal moderate tenderness on right. Good bs  Lab Results:  Recent Labs    09/24/23 1714 09/25/23 0531  WBC 22.1* 18.1*  HGB 14.3 12.6  HCT 43.0 39.5  PLT 240 208   BMET Recent Labs    09/24/23 1714 09/25/23 0531  NA 125* 131*  K 3.5 3.5  CL 93* 101  CO2 21* 21*  GLUCOSE 127* 108*  BUN 8 7*  CREATININE 0.86 0.72  CALCIUM 8.7* 7.9*   PT/INR No results for input(s): "LABPROT", "INR" in the last 72 hours. ABG No results for input(s): "PHART", "HCO3" in the last 72 hours.  Invalid input(s): "PCO2", "PO2"  Studies/Results: No results found.  Anti-infectives: Anti-infectives (From admission, onward)    Start     Dose/Rate Route Frequency Ordered Stop   09/24/23 2000  piperacillin-tazobactam (ZOSYN) IVPB 3.375 g        3.375 g 12.5 mL/hr over 240 Minutes Intravenous Every 8 hours 09/24/23 1843 10/01/23 2159   09/24/23 1645  cefTRIAXone (ROCEPHIN) 2 g in sodium chloride 0.9 % 100 mL IVPB        2 g 200 mL/hr over 30 Minutes Intravenous  Once 09/24/23 1637 09/24/23 1759   09/24/23 1645  metroNIDAZOLE (FLAGYL) IVPB 500 mg        500 mg 100 mL/hr over 60 Minutes Intravenous  Once 09/24/23 1637 09/24/23 1900       Assessment/Plan: s/p * No surgery found * Advance diet. Allow sips of clears  today Recheck wbc today Continue IV zosyn Perforated appendicitis. Slow improvement. Monitor closely  LOS: 2 days    Sarah Hicks 09/26/2023

## 2023-09-27 MED ORDER — SIMETHICONE 40 MG/0.6ML PO SUSP
40.0000 mg | Freq: Four times a day (QID) | ORAL | Status: DC | PRN
  Administered 2023-09-27 – 2023-10-03 (×8): 40 mg via ORAL
  Filled 2023-09-27 (×9): qty 0.6

## 2023-09-27 MED ORDER — ENOXAPARIN SODIUM 40 MG/0.4ML IJ SOSY
40.0000 mg | PREFILLED_SYRINGE | INTRAMUSCULAR | Status: DC
  Administered 2023-09-27 – 2023-09-28 (×2): 40 mg via SUBCUTANEOUS
  Filled 2023-09-27 (×2): qty 0.4

## 2023-09-27 MED ORDER — TRAMADOL HCL 50 MG PO TABS
50.0000 mg | ORAL_TABLET | Freq: Four times a day (QID) | ORAL | Status: DC | PRN
  Administered 2023-09-27 – 2023-10-04 (×9): 50 mg via ORAL
  Filled 2023-09-27 (×11): qty 1

## 2023-09-27 MED ORDER — ACETAMINOPHEN 500 MG PO TABS
1000.0000 mg | ORAL_TABLET | Freq: Four times a day (QID) | ORAL | Status: DC | PRN
  Administered 2023-09-28 – 2023-10-05 (×5): 1000 mg via ORAL
  Filled 2023-09-27 (×6): qty 2

## 2023-09-27 NOTE — TOC CM/SW Note (Signed)
Transition of Care [facility]) - Inpatient Brief Assessment   Patient Details  Name: Sarah Hicks MRN: [id number] Date of Birth: 11-23-1960  [facility]) CM/SW Contact:    Darleene Cleaver, LCSW Phone Number: 09/27/2023, 3:16 PM   Clinical Narrative:  Patient does not have any SDOH needs, she has insurance, and a PCP.  No anticipated TOC needs.   Transition of Care Asessment: Insurance and Status: Insurance coverage has been reviewed Patient has primary care physician: Yes Home environment has been reviewed: Yes lives with spouse Prior level of function:: Radio broadcast assistant Home Services: No current home services Social Drivers of Health Review: SDOH reviewed no interventions necessary Readmission risk has been reviewed: Yes Transition of care needs: no transition of care needs at this time

## 2023-09-27 NOTE — Plan of Care (Signed)
  Problem: Clinical Measurements: Goal: Will remain free from infection Outcome: Progressing Goal: Diagnostic test results will improve Outcome: Progressing   

## 2023-09-27 NOTE — Plan of Care (Signed)
  Problem: Coping: Goal: Level of anxiety will decrease Outcome: Progressing   Problem: Elimination: Goal: Will not experience complications related to urinary retention Outcome: Progressing   Problem: Pain Management: Goal: General experience of comfort will improve Outcome: Progressing   Problem: Safety: Goal: Ability to remain free from injury will improve Outcome: Progressing

## 2023-09-27 NOTE — Progress Notes (Signed)
Subjective: C/o gas pain.  Overall improved from admission, but feeling bloated.  Eating some liquids, but not taking in a ton.  Ambulating some.  Had some diarrhea yesterday.  Some burping, but no nausea  ROS: See above, otherwise other systems negative  Objective: Vital signs in last 24 hours: Temp:  [97.8 F (36.6 C)-98.8 F (37.1 C)] 98.1 F (36.7 C) (12/16 0533) Pulse Rate:  [76-81] 76 (12/16 0533) Resp:  [15-17] 15 (12/16 0533) BP: (139-156)/(70-78) 139/70 (12/16 0533) SpO2:  [96 %-99 %] 96 % (12/16 0533) Last BM Date : 09/26/23  Intake/Output from previous day: 12/15 0701 - 12/16 0700 In: 2497.9 [P.O.:970; I.V.:1218.8; IV Piggyback:309.1] Out: 350 [Urine:350] Intake/Output this shift: No intake/output data recorded.  PE: Gen: NAD, sitting up in her chair Heart: regular Lungs: CTAB Abd: soft, but with some distention, especially in her upper abdomen, few distant BS, tender still in RLQ, but overall improving  Lab Results:  Recent Labs    09/25/23 0531 09/26/23 0842  WBC 18.1* 12.4*  HGB 12.6 11.8*  HCT 39.5 36.9  PLT 208 226   BMET Recent Labs    09/25/23 0531 09/26/23 0842  NA 131* 135  K 3.5 3.7  CL 101 107  CO2 21* 18*  GLUCOSE 108* 71  BUN 7* 7*  CREATININE 0.72 0.54  CALCIUM 7.9* 8.1*   PT/INR No results for input(s): "LABPROT", "INR" in the last 72 hours. CMP     Component Value Date/Time   NA 135 09/26/2023 0842   NA 140 03/30/2023 0930   K 3.7 09/26/2023 0842   CL 107 09/26/2023 0842   CO2 18 (L) 09/26/2023 0842   GLUCOSE 71 09/26/2023 0842   BUN 7 (L) 09/26/2023 0842   BUN 10 03/30/2023 0930   CREATININE 0.54 09/26/2023 0842   CALCIUM 8.1 (L) 09/26/2023 0842   PROT 8.1 09/24/2023 1714   PROT 7.3 03/30/2023 0930   ALBUMIN 3.8 09/24/2023 1714   ALBUMIN 4.7 03/30/2023 0930   AST 68 (H) 09/24/2023 1714   ALT 70 (H) 09/24/2023 1714   ALKPHOS 107 09/24/2023 1714   BILITOT 2.0 (H) 09/24/2023 1714   BILITOT 0.3 03/30/2023  0930   GFRNONAA >60 09/26/2023 0842   GFRAA 89 12/18/2019 0954   Lipase     Component Value Date/Time   LIPASE 24 09/24/2023 1714       Studies/Results: No results found.  Anti-infectives: Anti-infectives (From admission, onward)    Start     Dose/Rate Route Frequency Ordered Stop   09/24/23 2000  piperacillin-tazobactam (ZOSYN) IVPB 3.375 g        3.375 g 12.5 mL/hr over 240 Minutes Intravenous Every 8 hours 09/24/23 1843 10/01/23 2159   09/24/23 1645  cefTRIAXone (ROCEPHIN) 2 g in sodium chloride 0.9 % 100 mL IVPB        2 g 200 mL/hr over 30 Minutes Intravenous  Once 09/24/23 1637 09/24/23 1759   09/24/23 1645  metroNIDAZOLE (FLAGYL) IVPB 500 mg        500 mg 100 mL/hr over 60 Minutes Intravenous  Once 09/24/23 1637 09/24/23 1900        Assessment/Plan Perforated appendicitis -cont CLD and add simethicone for gas pains  -labs in am -ambulate -continue to monitor closely.  Right now she seems to be improving overall, but if fails to improve could still need an operation. -d/w patient and husband at the bedside.  FEN - CLD VTE - Lovenox ID -  zosyn  GERD - protonix  I reviewed nursing notes, last 24 h vitals and pain scores, last 48 h intake and output, last 24 h labs and trends, and last 24 h imaging results.   LOS: 3 days    [name] , [facility] 09/27/2023, 9:35 AM Please see Amion for pager number during day hours 7:00am-4:30pm or 7:00am -11:30am on weekends

## 2023-09-28 LAB — CBC
HCT: 37.7 % (ref 36.0–46.0)
Hemoglobin: 11.9 g/dL — ABNORMAL LOW (ref 12.0–15.0)
MCH: 29.2 pg (ref 26.0–34.0)
MCHC: 31.6 g/dL (ref 30.0–36.0)
MCV: 92.4 fL (ref 80.0–100.0)
Platelets: 282 10*3/uL (ref 150–400)
RBC: 4.08 MIL/uL (ref 3.87–5.11)
RDW: 13.8 % (ref 11.5–15.5)
WBC: 11.7 10*3/uL — ABNORMAL HIGH (ref 4.0–10.5)
nRBC: 0 % (ref 0.0–0.2)

## 2023-09-28 MED ORDER — ENSURE ENLIVE PO LIQD
237.0000 mL | Freq: Two times a day (BID) | ORAL | Status: DC
  Administered 2023-09-28 – 2023-10-05 (×12): 237 mL via ORAL

## 2023-09-28 NOTE — Progress Notes (Signed)
Subjective: Patient feeling better today with addition of simethicone.  Passing some flatus.  No nausea, but taking in limited liquids just due to decreased appetite.  Up in chair.  Pain in improving still as well  ROS: See above, otherwise other systems negative  Objective: Vital signs in last 24 hours: Temp:  [97.7 F (36.5 C)-98.4 F (36.9 C)] 97.7 F (36.5 C) (12/17 0644) Pulse Rate:  [74-76] 76 (12/17 0644) Resp:  [18] 18 (12/17 0644) BP: (137-145)/(62-84) 137/84 (12/17 0644) SpO2:  [96 %-98 %] 96 % (12/17 0644) Last BM Date : 09/26/23  Intake/Output from previous day: 12/16 0701 - 12/17 0700 In: 1284.1 [P.O.:1200; IV Piggyback:84.1] Out: 750 [Urine:750] Intake/Output this shift: Total I/O In: -  Out: 250 [Urine:250]  PE: Gen: NAD, sitting up in her chair Heart: regular Lungs: CTAB Abd: soft, but with some distention, especially in her upper abdomen, some BS, tender still in RLQ, but overall better today than yesterday  Lab Results:  Recent Labs    09/26/23 0842 09/28/23 0453  WBC 12.4* 11.7*  HGB 11.8* 11.9*  HCT 36.9 37.7  PLT 226 282   BMET Recent Labs    09/26/23 0842  NA 135  K 3.7  CL 107  CO2 18*  GLUCOSE 71  BUN 7*  CREATININE 0.54  CALCIUM 8.1*   PT/INR No results for input(s): "LABPROT", "INR" in the last 72 hours. CMP     Component Value Date/Time   NA 135 09/26/2023 0842   NA 140 03/30/2023 0930   K 3.7 09/26/2023 0842   CL 107 09/26/2023 0842   CO2 18 (L) 09/26/2023 0842   GLUCOSE 71 09/26/2023 0842   BUN 7 (L) 09/26/2023 0842   BUN 10 03/30/2023 0930   CREATININE 0.54 09/26/2023 0842   CALCIUM 8.1 (L) 09/26/2023 0842   PROT 8.1 09/24/2023 1714   PROT 7.3 03/30/2023 0930   ALBUMIN 3.8 09/24/2023 1714   ALBUMIN 4.7 03/30/2023 0930   AST 68 (H) 09/24/2023 1714   ALT 70 (H) 09/24/2023 1714   ALKPHOS 107 09/24/2023 1714   BILITOT 2.0 (H) 09/24/2023 1714   BILITOT 0.3 03/30/2023 0930   GFRNONAA >60 09/26/2023 0842    GFRAA 89 12/18/2019 0954   Lipase     Component Value Date/Time   LIPASE 24 09/24/2023 1714       Studies/Results: No results found.  Anti-infectives: Anti-infectives (From admission, onward)    Start     Dose/Rate Route Frequency Ordered Stop   09/24/23 2000  piperacillin-tazobactam (ZOSYN) IVPB 3.375 g        3.375 g 12.5 mL/hr over 240 Minutes Intravenous Every 8 hours 09/24/23 1843 10/01/23 2159   09/24/23 1645  cefTRIAXone (ROCEPHIN) 2 g in sodium chloride 0.9 % 100 mL IVPB        2 g 200 mL/hr over 30 Minutes Intravenous  Once 09/24/23 1637 09/24/23 1759   09/24/23 1645  metroNIDAZOLE (FLAGYL) IVPB 500 mg        500 mg 100 mL/hr over 60 Minutes Intravenous  Once 09/24/23 1637 09/24/23 1900        Assessment/Plan Perforated appendicitis -Adv to FLD and add Ensure BID, cont simethicone for gas pains  -WBC down to 11K -ambulate -continue to monitor closely.  Right now she seems to be improving overall, but if fails to improve could still need an operation. -d/w patient and husband at the bedside.  FEN - FLD, Ensure VTE - Lovenox  ID - zosyn  GERD - protonix  I reviewed nursing notes, last 24 h vitals and pain scores, last 48 h intake and output, last 24 h labs and trends, and last 24 h imaging results.   LOS: 4 days    [name] , [facility] 09/28/2023, 10:20 AM Please see Amion for pager number during day hours 7:00am-4:30pm or 7:00am -11:30am on weekends

## 2023-09-29 ENCOUNTER — Inpatient Hospital Stay (HOSPITAL_COMMUNITY): Payer: 59

## 2023-09-29 ENCOUNTER — Encounter (HOSPITAL_COMMUNITY): Payer: Self-pay

## 2023-09-29 LAB — PROTIME-INR
INR: 1.1 (ref 0.8–1.2)
Prothrombin Time: 14.3 s (ref 11.4–15.2)

## 2023-09-29 MED ORDER — IOHEXOL 300 MG/ML  SOLN
100.0000 mL | Freq: Once | INTRAMUSCULAR | Status: AC | PRN
  Administered 2023-09-29: 100 mL via INTRAVENOUS

## 2023-09-29 MED ORDER — SODIUM CHLORIDE (PF) 0.9 % IJ SOLN
INTRAMUSCULAR | Status: AC
  Filled 2023-09-29: qty 50

## 2023-09-29 MED ORDER — FENTANYL CITRATE (PF) 100 MCG/2ML IJ SOLN
INTRAMUSCULAR | Status: AC | PRN
  Administered 2023-09-29 (×2): 50 ug via INTRAVENOUS

## 2023-09-29 MED ORDER — MIDAZOLAM HCL 2 MG/2ML IJ SOLN
INTRAMUSCULAR | Status: AC | PRN
  Administered 2023-09-29 (×2): 1 mg via INTRAVENOUS

## 2023-09-29 MED ORDER — MIDAZOLAM HCL 2 MG/2ML IJ SOLN
INTRAMUSCULAR | Status: AC
Start: 2023-09-29 — End: ?
  Filled 2023-09-29: qty 2

## 2023-09-29 MED ORDER — ENOXAPARIN SODIUM 40 MG/0.4ML IJ SOSY
40.0000 mg | PREFILLED_SYRINGE | INTRAMUSCULAR | Status: DC
Start: 2023-09-29 — End: 2023-10-05
  Administered 2023-09-29 – 2023-10-04 (×6): 40 mg via SUBCUTANEOUS
  Filled 2023-09-29 (×6): qty 0.4

## 2023-09-29 MED ORDER — NALOXONE HCL 0.4 MG/ML IJ SOLN
INTRAMUSCULAR | Status: AC
  Filled 2023-09-29: qty 1

## 2023-09-29 MED ORDER — FENTANYL CITRATE (PF) 100 MCG/2ML IJ SOLN
INTRAMUSCULAR | Status: AC
  Filled 2023-09-29: qty 2

## 2023-09-29 MED ORDER — FLUMAZENIL 0.5 MG/5ML IV SOLN
INTRAVENOUS | Status: AC
  Filled 2023-09-29: qty 5

## 2023-09-29 NOTE — Progress Notes (Signed)
Subjective: No significant changes. Patient still reports some RLQ pain, and minimal appetite. Still has some bloating.  Objective: Vital signs in last 24 hours: Temp:  [97.7 F (36.5 C)-98.6 F (37 C)] 97.9 F (36.6 C) (12/18 0517) Pulse Rate:  [74-78] 74 (12/18 0517) Resp:  [15-18] 15 (12/18 0517) BP: (135-145)/(72-81) 143/75 (12/18 0517) SpO2:  [97 %-98 %] 97 % (12/18 0517) Last BM Date : 09/28/23  Intake/Output from previous day: 12/17 0701 - 12/18 0700 In: 2236.3 [P.O.:2030; IV Piggyback:206.3] Out: 1150 [Urine:1150] Intake/Output this shift: No intake/output data recorded.  PE: Gen: resting in bed, NAD Neuro: alert and oriented, no focal deficits Lungs: normal work of breathing on room air Abd: mildly distended but soft, tender to palpation in RLQ  Lab Results:  Recent Labs    09/26/23 0842 09/28/23 0453  WBC 12.4* 11.7*  HGB 11.8* 11.9*  HCT 36.9 37.7  PLT 226 282   BMET Recent Labs    09/26/23 0842  NA 135  K 3.7  CL 107  CO2 18*  GLUCOSE 71  BUN 7*  CREATININE 0.54  CALCIUM 8.1*   PT/INR No results for input(s): "LABPROT", "INR" in the last 72 hours. CMP     Component Value Date/Time   NA 135 09/26/2023 0842   NA 140 03/30/2023 0930   K 3.7 09/26/2023 0842   CL 107 09/26/2023 0842   CO2 18 (L) 09/26/2023 0842   GLUCOSE 71 09/26/2023 0842   BUN 7 (L) 09/26/2023 0842   BUN 10 03/30/2023 0930   CREATININE 0.54 09/26/2023 0842   CALCIUM 8.1 (L) 09/26/2023 0842   PROT 8.1 09/24/2023 1714   PROT 7.3 03/30/2023 0930   ALBUMIN 3.8 09/24/2023 1714   ALBUMIN 4.7 03/30/2023 0930   AST 68 (H) 09/24/2023 1714   ALT 70 (H) 09/24/2023 1714   ALKPHOS 107 09/24/2023 1714   BILITOT 2.0 (H) 09/24/2023 1714   BILITOT 0.3 03/30/2023 0930   GFRNONAA >60 09/26/2023 0842   GFRAA 89 12/18/2019 0954   Lipase     Component Value Date/Time   LIPASE 24 09/24/2023 1714       Studies/Results: No results  found.  Anti-infectives: Anti-infectives (From admission, onward)    Start     Dose/Rate Route Frequency Ordered Stop   09/24/23 2000  piperacillin-tazobactam (ZOSYN) IVPB 3.375 g        3.375 g 12.5 mL/hr over 240 Minutes Intravenous Every 8 hours 09/24/23 1843 10/01/23 2159   09/24/23 1645  cefTRIAXone (ROCEPHIN) 2 g in sodium chloride 0.9 % 100 mL IVPB        2 g 200 mL/hr over 30 Minutes Intravenous  Once 09/24/23 1637 09/24/23 1759   09/24/23 1645  metroNIDAZOLE (FLAGYL) IVPB 500 mg        500 mg 100 mL/hr over 60 Minutes Intravenous  Once 09/24/23 1637 09/24/23 1900        Assessment/Plan Perforated appendicitis -Patient continues to have RLQ pain with bloating and some symptoms of ileus, has not progressed significantly in last 1-2 days. Will repeat CT abd/pelvis today to evaluate for an abscess. If no drainable abscess but no clinical improvement, she may need appendectomy this admission.  FEN - NPO pending CT results VTE - Lovenox ID - zosyn  GERD - protonix  I reviewed nursing notes, last 24 h vitals and pain scores, last 48 h intake and output, last 24 h labs and trends, and last 24 h imaging results.  LOS: 5 days    Fritzi Mandes , MD [facility] 09/29/2023, 8:36 AM Please see Amion for pager number during day hours 7:00am-4:30pm or 7:00am -11:30am on weekends

## 2023-09-29 NOTE — Progress Notes (Signed)
Updated patient on her CT scan results.  Will have IR assess to see if they can drain this area, but if not may need to consider moving forward with OR.  Patient understands all of this.  All questions were answered.  [name] 10:43 AM 09/29/2023

## 2023-09-29 NOTE — Progress Notes (Signed)
Referring Physician(s): Allen,S  Supervising Physician: Ruel Favors  Patient Status:  [facility] - In-pt  Chief Complaint:  Abdominal pain/periappendiceal abscess  Subjective: Patient familiar to IR team from left nephroureteral catheter placement in 2020.  She is a 62 year old female with past medical history significant for arthritis, GERD.  Nephrolithiasis, vitamin D deficiency, scoliosis was admitted to [facility] on 12/13 with persistent abdominal pain and occasional nausea and imaging consistent with perforated appendicitis. She now has a small periappendiceal abscess and request received from surgery for image guided aspiration/drainage.  She is currently afebrile, WBC 11.7, hemoglobin 11.9, platelets normal, creatinine normal, PT/INR pending.  She is on IV Zosyn.  She denies fever, chest pain, dyspnea, cough, back pain, nausea, vomiting or bleeding.  She has had some mild headaches, and above-noted abdominal discomfort.   Past Medical History:  Diagnosis Date   Arthritis    oa   Complication of anesthesia    told to breathe twice after a morton's neuroma surgery 2003, did fine with other surgeries since   GERD (gastroesophageal reflux disease)    Headache    History of kidney stones    passed on own   Kidney stones    Scoliosis    Vitamin D deficiency 03/25/2016      Allergies: Latex, Morphine, and Tape  Medications: Prior to Admission medications   Medication Sig Start Date End Date Taking? Authorizing Provider  acetaminophen (TYLENOL) 500 MG tablet Take 1,000 mg by mouth daily as needed for moderate pain or headache.   Yes [provider]  ADVIL 200 MG CAPS Take 200-400 mg by mouth every 6 (six) hours as needed (for pain or headaches).   Yes [provider]  Ascorbic Acid (VITAMIN C PO) Take 1 tablet by mouth daily.   Yes [provider]  Cholecalciferol (VITAMIN D3 PO) Take 1 capsule by mouth daily.   Yes [provider]  L-LYSINE PO Take 1 tablet by mouth daily.   Yes [provider]  loratadine (CLARITIN) 10 MG tablet Take 10 mg by mouth daily as needed for allergies (if not taking Zyrtec).   Yes [provider]  Multiple Vitamins-Minerals (ONE-A-DAY 50 PLUS PO) Take 1 tablet by mouth daily with breakfast.   Yes [provider]  Omega-3 Fatty Acids (FISH OIL PO) Take 1 capsule by mouth daily.   Yes [provider]  Probiotic Product (PROBIOTIC DAILY PO) Take 1 capsule by mouth daily.   Yes [provider]  ZYRTEC ALLERGY 10 MG tablet Take 10 mg by mouth daily as needed for allergies or rhinitis (if not taking Loratadine).   Yes [provider]  Cholecalciferol 125 MCG (5000 UT) capsule Take 1 capsule (5,000 Units total) by mouth daily. Patient not taking: Reported on 09/24/2023 09/06/18   Adline Potter, NP  rosuvastatin (CRESTOR) 10 MG tablet Take 1 tablet daily at bedtime Patient not taking: Reported on 09/24/2023 03/31/23   Adline Potter, NP     Vital Signs: BP (!) 143/75 (BP Location: Right Arm)   Pulse 74   Temp 97.9 F (36.6 C) (Oral)   Resp 15   Ht 5\' 5"  (1.651 m)   Wt 240 lb (108.9 kg)   LMP 04/13/2013   SpO2 97%   BMI 39.94 kg/m   Physical Exam awake, alert.  Chest clear to auscultation bilaterally.  Heart with regular rate and rhythm.  Abd- soft, mildly distended, few bowel sounds, some diffuse tenderness to  palpation, more so right lower quadrant.  Extremities with full range of motion.  Imaging: No results found.  Labs:  CBC: Recent Labs    09/24/23 1714 09/25/23 0531 09/26/23 0842 09/28/23 0453  WBC 22.1* 18.1* 12.4* 11.7*  HGB 14.3 12.6 11.8* 11.9*  HCT 43.0 39.5 36.9 37.7  PLT 240 208 226 282    COAGS: No results for input(s): "INR", "APTT" in the last 8760 hours.  BMP: Recent Labs    03/30/23 0930 09/24/23 1714 09/25/23 0531 09/26/23 0842  NA 140 125* 131* 135  K 4.6 3.5 3.5 3.7  CL 102 93*  101 107  CO2 20 21* 21* 18*  GLUCOSE 98 127* 108* 71  BUN 10 8 7* 7*  CALCIUM 9.4 8.7* 7.9* 8.1*  CREATININE 0.81 0.86 0.72 0.54  GFRNONAA  --  >60 >60 >60    LIVER FUNCTION TESTS: Recent Labs    03/30/23 0930 09/24/23 1714  BILITOT 0.3 2.0*  AST 37 68*  ALT 36* 70*  ALKPHOS 91 107  PROT 7.3 8.1  ALBUMIN 4.7 3.8    Assessment and Plan: 62 year old female with past medical history significant for arthritis, GERD.  Nephrolithiasis, vitamin D deficiency, scoliosis was admitted to [facility] on 12/13 with persistent abdominal pain and occasional nausea and imaging consistent with perforated appendicitis. She now has a small periappendiceal abscess and request received from surgery for image guided aspiration/drainage.  She is currently afebrile, WBC 11.7, hemoglobin 11.9, platelets normal, creatinine normal, PT/INR pending.  She is on IV Zosyn.  Latest imaging studies have been reviewed by Dr. Miles Costain.Risks and benefits discussed with the patient including bleeding, infection, damage to adjacent structures, bowel perforation/fistula connection, and sepsis.  All of the patient's questions were answered, patient is agreeable to proceed. Consent signed and in chart.  Procedure scheduled for today.   Electronically Signed: D. Jeananne Rama, PA-C 09/29/2023, 11:44 AM   I spent a total of 25 minutes at the the patient's bedside AND on the patient's hospital floor or unit, greater than 50% of which was counseling/coordinating care for CT-guided aspiration/possible drainage of periappendiceal abscess    Patient ID: Sarah Hicks, female   DOB: 12-04-1960, 62 y.o.   MRN: [id number]

## 2023-09-29 NOTE — Plan of Care (Signed)
  Problem: Nutrition: Goal: Adequate nutrition will be maintained Outcome: Progressing   Problem: Elimination: Goal: Will not experience complications related to urinary retention Outcome: Completed/Met   Problem: Safety: Goal: Ability to remain free from injury will improve Outcome: Progressing

## 2023-09-29 NOTE — Procedures (Signed)
Interventional Radiology Procedure Note  Procedure: CT DRAIN RLQ ABSCESS    Complications: None  Estimated Blood Loss:  MIN  Findings: 25CC PUS CX SENT 10FR DRAIN INSERTED     Sharen Counter, MD

## 2023-09-30 LAB — BASIC METABOLIC PANEL
Anion gap: 12 (ref 5–15)
BUN: 6 mg/dL — ABNORMAL LOW (ref 8–23)
CO2: 22 mmol/L (ref 22–32)
Calcium: 8.5 mg/dL — ABNORMAL LOW (ref 8.9–10.3)
Chloride: 103 mmol/L (ref 98–111)
Creatinine, Ser: 0.55 mg/dL (ref 0.44–1.00)
GFR, Estimated: >60 mL/min (ref >60)
Glucose, Bld: 93 mg/dL (ref 70–99)
Potassium: 3.1 mmol/L — ABNORMAL LOW (ref 3.5–5.1)
Sodium: 137 mmol/L (ref 135–145)

## 2023-09-30 LAB — CBC
HCT: 38.3 % (ref 36.0–46.0)
Hemoglobin: 12.4 g/dL (ref 12.0–15.0)
MCH: 29.1 pg (ref 26.0–34.0)
MCHC: 32.4 g/dL (ref 30.0–36.0)
MCV: 89.9 fL (ref 80.0–100.0)
Platelets: 320 10*3/uL (ref 150–400)
RBC: 4.26 MIL/uL (ref 3.87–5.11)
RDW: 13.7 % (ref 11.5–15.5)
WBC: 11.5 10*3/uL — ABNORMAL HIGH (ref 4.0–10.5)
nRBC: 0 % (ref 0.0–0.2)

## 2023-09-30 MED ORDER — SODIUM CHLORIDE 0.9% FLUSH
5.0000 mL | Freq: Three times a day (TID) | INTRAVENOUS | Status: DC
  Administered 2023-09-30 – 2023-10-05 (×14): 5 mL

## 2023-09-30 MED ORDER — PANTOPRAZOLE SODIUM 40 MG PO TBEC
40.0000 mg | DELAYED_RELEASE_TABLET | Freq: Every day | ORAL | Status: DC
  Administered 2023-09-30 – 2023-10-04 (×5): 40 mg via ORAL
  Filled 2023-09-30 (×5): qty 1

## 2023-09-30 NOTE — Progress Notes (Signed)
Subjective: Feeling better today after drain placement.  Gas is now gone and feels less bloated.  Pain seems a little better as well, just sore around JP drain.  Up a bit more last night.  Wants some mashed potatoes  ROS: See above, otherwise other systems negative  Objective: Vital signs in last 24 hours: Temp:  [97.9 F (36.6 C)-98.2 F (36.8 C)] 97.9 F (36.6 C) (12/19 0527) Pulse Rate:  [65-90] 77 (12/19 0527) Resp:  [12-33] 14 (12/19 0527) BP: (138-166)/(70-91) 150/77 (12/19 0527) SpO2:  [96 %-100 %] 96 % (12/19 0527) Last BM Date : 09/28/23  Intake/Output from previous day: 12/18 0701 - 12/19 0700 In: 387.7 [P.O.:240; IV Piggyback:147.7] Out: 850 [Urine:800; Drains:50] Intake/Output this shift: No intake/output data recorded.  PE: Gen: NAD, laying in bed Abd: softer, less distended today, JP in place with some bloody output, but minimal.  50cc noted since return from IR yesterday.  Lab Results:  Recent Labs    09/28/23 0453 09/30/23 0755  WBC 11.7* 11.5*  HGB 11.9* 12.4  HCT 37.7 38.3  PLT 282 320   BMET No results for input(s): "NA", "K", "CL", "CO2", "GLUCOSE", "BUN", "CREATININE", "CALCIUM" in the last 72 hours.  PT/INR Recent Labs    09/29/23 1107  LABPROT 14.3  INR 1.1   CMP     Component Value Date/Time   NA 135 09/26/2023 0842   NA 140 03/30/2023 0930   K 3.7 09/26/2023 0842   CL 107 09/26/2023 0842   CO2 18 (L) 09/26/2023 0842   GLUCOSE 71 09/26/2023 0842   BUN 7 (L) 09/26/2023 0842   BUN 10 03/30/2023 0930   CREATININE 0.54 09/26/2023 0842   CALCIUM 8.1 (L) 09/26/2023 0842   PROT 8.1 09/24/2023 1714   PROT 7.3 03/30/2023 0930   ALBUMIN 3.8 09/24/2023 1714   ALBUMIN 4.7 03/30/2023 0930   AST 68 (H) 09/24/2023 1714   ALT 70 (H) 09/24/2023 1714   ALKPHOS 107 09/24/2023 1714   BILITOT 2.0 (H) 09/24/2023 1714   BILITOT 0.3 03/30/2023 0930   GFRNONAA >60 09/26/2023 0842   GFRAA 89 12/18/2019 0954   Lipase     Component  Value Date/Time   LIPASE 24 09/24/2023 1714       Studies/Results: CT GUIDED PERITONEAL/RETROPERITONEAL FLUID DRAIN BY PERC CATH Result Date: 09/29/2023 INDICATION: Right lower quadrant ruptured appendicitis with abscess EXAM: CT DRAINAGE RIGHT LOWER QUADRANT ABSCESS MEDICATIONS: The patient is currently admitted to the hospital and receiving intravenous antibiotics. The antibiotics were administered within an appropriate time frame prior to the initiation of the procedure. ANESTHESIA/SEDATION: Moderate (conscious) sedation was employed during this procedure. A total of Versed 2.0 mg and Fentanyl 100 mcg was administered intravenously by the radiology nurse. Total intra-service moderate Sedation Time: 15 minutes. The patient's level of consciousness and vital signs were monitored continuously by radiology nursing throughout the procedure under my direct supervision. COMPLICATIONS: None immediate. PROCEDURE: Informed written consent was obtained from the patient after a thorough discussion of the procedural risks, benefits and alternatives. All questions were addressed. Maximal Sterile Barrier Technique was utilized including caps, mask, sterile gowns, sterile gloves, sterile drape, hand hygiene and skin antiseptic. A timeout was performed prior to the initiation of the procedure. Previous imaging reviewed. Patient positioned supine. Noncontrast localization CT performed. The right lower quadrant abscess was localized and marked for a lateral approach. Under sterile conditions and local anesthesia, an 18 gauge 15 cm access was advanced from a  lateral approach into the abscess. Needle position confirmed with CT. Syringe aspiration yielded purulent fluid. Guidewire coiled within the abscess and confirmed with CT. Tract dilatation performed to insert a 10 [place] drain. Drain catheter position confirmed with CT. Syringe aspiration yielded 25 cc purulent fluid. Sample sent for culture. Catheter secured with  Prolene suture and connected to external suction bulb. Sterile dressing applied. No immediate complication. Patient tolerated the procedure well. IMPRESSION: Successful CT-guided right lower quadrant abscess drain. Electronically Signed   By: Judie Petit.  Shick M.D.   On: 09/29/2023 16:23   CT ABDOMEN PELVIS W CONTRAST Result Date: 09/29/2023 CLINICAL DATA:  Perforated appendicitis.  Evaluate for abscess. EXAM: CT ABDOMEN AND PELVIS WITH CONTRAST TECHNIQUE: Multidetector CT imaging of the abdomen and pelvis was performed using the standard protocol following bolus administration of intravenous contrast. RADIATION DOSE REDUCTION: This exam was performed according to the departmental dose-optimization program which includes automated exposure control, adjustment of the mA and/or kV according to patient size and/or use of iterative reconstruction technique. CONTRAST:  OMNIPAQUE IOHEXOL 300 MG/ML  SOLN COMPARISON:  09/24/2023 FINDINGS: Lower chest: Trace dependent atelectasis.  No pleural effusion. Hepatobiliary: No suspicious focal abnormality within the liver parenchyma. Gallbladder is surgically absent. No intrahepatic or extrahepatic biliary dilation. Pancreas: No focal mass lesion. No dilatation of the main duct. No intraparenchymal cyst. No peripancreatic edema. Spleen: No splenomegaly. No suspicious focal mass lesion. Adrenals/Urinary Tract: No adrenal nodule or mass. Right kidney unremarkable. 5 x 9 mm nonobstructing stone interpolar left kidney. No evidence for hydroureter. The urinary bladder appears normal for the degree of distention. Stomach/Bowel: Stomach is unremarkable. No gastric wall thickening. No evidence of outlet obstruction. Duodenum is normally positioned as is the ligament of Treitz. No small bowel obstruction. The wall of the terminal ileum is irregular and mildly thickened, likely secondary. The tip of the appendix is no longer discretely visible. The 3.5 x 1.6 cm right lower quadrant abscess  seen previously is now 4.4 x 2.5 cm on image 64/2, likely incorporating the tip of the appendix with 9 mm stone seen in the rim enhancing fluid collection (axial 66/2 and coronal 47/4). No gross colonic mass. No colonic wall thickening. Vascular/Lymphatic: There is mild atherosclerotic calcification of the abdominal aorta without aneurysm. There is no gastrohepatic or hepatoduodenal ligament lymphadenopathy. No retroperitoneal or mesenteric lymphadenopathy. No pelvic sidewall lymphadenopathy. Reproductive: The uterus is unremarkable.  There is no adnexal mass. Other: Trace free fluid in the cul-de-sac. Musculoskeletal: No worrisome lytic or sclerotic osseous abnormality. Bilateral pars interarticularis defects noted at L5. IMPRESSION: 1. The 3.5 x 1.6 cm right lower quadrant abscess seen previously is now 4.4 x 2.5 cm, likely incorporating the tip of the appendix with 9 mm stone seen in the rim enhancing fluid collection. 2. The wall of the terminal ileum is irregular and mildly thickened, likely secondary. 3. 5 x 9 mm nonobstructing stone interpolar left kidney. 4. Trace free fluid in the cul-de-sac. 5.  Aortic Atherosclerosis (ICD10-I70.0). Electronically Signed   By: Kennith Center M.D.   On: 09/29/2023 13:39    Anti-infectives: Anti-infectives (From admission, onward)    Start     Dose/Rate Route Frequency Ordered Stop   09/24/23 2000  piperacillin-tazobactam (ZOSYN) IVPB 3.375 g        3.375 g 12.5 mL/hr over 240 Minutes Intravenous Every 8 hours 09/24/23 1843 10/01/23 2159   09/24/23 1645  cefTRIAXone (ROCEPHIN) 2 g in sodium chloride 0.9 % 100 mL IVPB  2 g 200 mL/hr over 30 Minutes Intravenous  Once 09/24/23 1637 09/24/23 1759   09/24/23 1645  metroNIDAZOLE (FLAGYL) IVPB 500 mg        500 mg 100 mL/hr over 60 Minutes Intravenous  Once 09/24/23 1637 09/24/23 1900        Assessment/Plan Perforated appendicitis -soft diet so patient can have some mashed potatoes -WBC 11.5K -BMET in  process -ambulate -IR drain placed on 12/18.  Cultures pending -continue conservative management for now  FEN - soft, Ensure VTE - Lovenox ID - zosyn  GERD - protonix  I reviewed nursing notes, last 24 h vitals and pain scores, last 48 h intake and output, last 24 h labs and trends, and last 24 h imaging results.   LOS: 6 days    [name] , [facility] Surgery 09/30/2023, 8:39 AM Please see Amion for pager number during day hours 7:00am-4:30pm or 7:00am -11:30am on weekends

## 2023-09-30 NOTE — Progress Notes (Signed)
Referring Physician(s): Allen,S  Supervising Physician: Simonne Come  Patient Status:  [facility] - In-pt  Chief Complaint: Abdominal pain/periappendiceal abscess    Subjective: Pt sitting up on edge of bed; states that she feels better since abd drain placed yesterday; denies fever,N/V; has ambulated; has some expected soreness at drain site RLQ  Allergies: Latex, Morphine, and Tape  Medications: Prior to Admission medications   Medication Sig Start Date End Date Taking? Authorizing Provider  acetaminophen (TYLENOL) 500 MG tablet Take 1,000 mg by mouth daily as needed for moderate pain or headache.   Yes [provider]  ADVIL 200 MG CAPS Take 200-400 mg by mouth every 6 (six) hours as needed (for pain or headaches).   Yes [provider]  Ascorbic Acid (VITAMIN C PO) Take 1 tablet by mouth daily.   Yes [provider]  Cholecalciferol (VITAMIN D3 PO) Take 1 capsule by mouth daily.   Yes [provider]  L-LYSINE PO Take 1 tablet by mouth daily.   Yes [provider]  loratadine (CLARITIN) 10 MG tablet Take 10 mg by mouth daily as needed for allergies (if not taking Zyrtec).   Yes [provider]  Multiple Vitamins-Minerals (ONE-A-DAY 50 PLUS PO) Take 1 tablet by mouth daily with breakfast.   Yes [provider]  Omega-3 Fatty Acids (FISH OIL PO) Take 1 capsule by mouth daily.   Yes [provider]  Probiotic Product (PROBIOTIC DAILY PO) Take 1 capsule by mouth daily.   Yes [provider]  ZYRTEC ALLERGY 10 MG tablet Take 10 mg by mouth daily as needed for allergies or rhinitis (if not taking Loratadine).   Yes [provider]  Cholecalciferol 125 MCG (5000 UT) capsule Take 1 capsule (5,000 Units total) by mouth daily. Patient not taking: Reported on 09/24/2023 09/06/18   Adline Potter, NP  rosuvastatin (CRESTOR) 10 MG tablet Take 1 tablet daily at bedtime Patient not taking: Reported on  09/24/2023 03/31/23   Adline Potter, NP     Vital Signs: BP (!) 150/77 (BP Location: Left Arm)   Pulse 77   Temp 97.9 F (36.6 C) (Oral)   Resp 14   Ht 5\' 5"  (1.651 m)   Wt 240 lb (108.9 kg)   LMP 04/13/2013   SpO2 96%   BMI 39.94 kg/m   Physical Exam awake/alert; RLQ drain intact, insertion site ok, mildly tender, OP 50 cc blood-tinged fluid; drain flushed without difficulty  Imaging: CT GUIDED PERITONEAL/RETROPERITONEAL FLUID DRAIN BY PERC CATH Result Date: 09/29/2023 INDICATION: Right lower quadrant ruptured appendicitis with abscess EXAM: CT DRAINAGE RIGHT LOWER QUADRANT ABSCESS MEDICATIONS: The patient is currently admitted to the hospital and receiving intravenous antibiotics. The antibiotics were administered within an appropriate time frame prior to the initiation of the procedure. ANESTHESIA/SEDATION: Moderate (conscious) sedation was employed during this procedure. A total of Versed 2.0 mg and Fentanyl 100 mcg was administered intravenously by the radiology nurse. Total intra-service moderate Sedation Time: 15 minutes. The patient's level of consciousness and vital signs were monitored continuously by radiology nursing throughout the procedure under my direct supervision. COMPLICATIONS: None immediate. PROCEDURE: Informed written consent was obtained from the patient after a thorough discussion of the procedural risks, benefits and alternatives. All questions were addressed. Maximal Sterile Barrier Technique was utilized including caps, mask, sterile gowns, sterile gloves, sterile drape, hand hygiene and skin antiseptic. A timeout was performed prior to the initiation of the procedure. Previous imaging reviewed. Patient positioned supine.  Noncontrast localization CT performed. The right lower quadrant abscess was localized and marked for a lateral approach. Under sterile conditions and local anesthesia, an 18 gauge 15 cm access was advanced from a lateral approach into the  abscess. Needle position confirmed with CT. Syringe aspiration yielded purulent fluid. Guidewire coiled within the abscess and confirmed with CT. Tract dilatation performed to insert a 10 [place] drain. Drain catheter position confirmed with CT. Syringe aspiration yielded 25 cc purulent fluid. Sample sent for culture. Catheter secured with Prolene suture and connected to external suction bulb. Sterile dressing applied. No immediate complication. Patient tolerated the procedure well. IMPRESSION: Successful CT-guided right lower quadrant abscess drain. Electronically Signed   By: Judie Petit.  Shick M.D.   On: 09/29/2023 16:23   CT ABDOMEN PELVIS W CONTRAST Result Date: 09/29/2023 CLINICAL DATA:  Perforated appendicitis.  Evaluate for abscess. EXAM: CT ABDOMEN AND PELVIS WITH CONTRAST TECHNIQUE: Multidetector CT imaging of the abdomen and pelvis was performed using the standard protocol following bolus administration of intravenous contrast. RADIATION DOSE REDUCTION: This exam was performed according to the departmental dose-optimization program which includes automated exposure control, adjustment of the mA and/or kV according to patient size and/or use of iterative reconstruction technique. CONTRAST:  OMNIPAQUE IOHEXOL 300 MG/ML  SOLN COMPARISON:  09/24/2023 FINDINGS: Lower chest: Trace dependent atelectasis.  No pleural effusion. Hepatobiliary: No suspicious focal abnormality within the liver parenchyma. Gallbladder is surgically absent. No intrahepatic or extrahepatic biliary dilation. Pancreas: No focal mass lesion. No dilatation of the main duct. No intraparenchymal cyst. No peripancreatic edema. Spleen: No splenomegaly. No suspicious focal mass lesion. Adrenals/Urinary Tract: No adrenal nodule or mass. Right kidney unremarkable. 5 x 9 mm nonobstructing stone interpolar left kidney. No evidence for hydroureter. The urinary bladder appears normal for the degree of distention. Stomach/Bowel: Stomach is  unremarkable. No gastric wall thickening. No evidence of outlet obstruction. Duodenum is normally positioned as is the ligament of Treitz. No small bowel obstruction. The wall of the terminal ileum is irregular and mildly thickened, likely secondary. The tip of the appendix is no longer discretely visible. The 3.5 x 1.6 cm right lower quadrant abscess seen previously is now 4.4 x 2.5 cm on image 64/2, likely incorporating the tip of the appendix with 9 mm stone seen in the rim enhancing fluid collection (axial 66/2 and coronal 47/4). No gross colonic mass. No colonic wall thickening. Vascular/Lymphatic: There is mild atherosclerotic calcification of the abdominal aorta without aneurysm. There is no gastrohepatic or hepatoduodenal ligament lymphadenopathy. No retroperitoneal or mesenteric lymphadenopathy. No pelvic sidewall lymphadenopathy. Reproductive: The uterus is unremarkable.  There is no adnexal mass. Other: Trace free fluid in the cul-de-sac. Musculoskeletal: No worrisome lytic or sclerotic osseous abnormality. Bilateral pars interarticularis defects noted at L5. IMPRESSION: 1. The 3.5 x 1.6 cm right lower quadrant abscess seen previously is now 4.4 x 2.5 cm, likely incorporating the tip of the appendix with 9 mm stone seen in the rim enhancing fluid collection. 2. The wall of the terminal ileum is irregular and mildly thickened, likely secondary. 3. 5 x 9 mm nonobstructing stone interpolar left kidney. 4. Trace free fluid in the cul-de-sac. 5.  Aortic Atherosclerosis (ICD10-I70.0). Electronically Signed   By: Kennith Center M.D.   On: 09/29/2023 13:39    Labs:  CBC: Recent Labs    09/25/23 0531 09/26/23 0842 09/28/23 0453 09/30/23 0755  WBC 18.1* 12.4* 11.7* 11.5*  HGB 12.6 11.8* 11.9* 12.4  HCT 39.5 36.9 37.7 38.3  PLT 208 226  282 320    COAGS: Recent Labs    09/29/23 1107  INR 1.1    BMP: Recent Labs    09/24/23 1714 09/25/23 0531 09/26/23 0842 09/30/23 0755  NA 125* 131* 135  137  K 3.5 3.5 3.7 3.1*  CL 93* 101 107 103  CO2 21* 21* 18* 22  GLUCOSE 127* 108* 71 93  BUN 8 7* 7* 6*  CALCIUM 8.7* 7.9* 8.1* 8.5*  CREATININE 0.86 0.72 0.54 0.55  GFRNONAA >60 >60 >60 >60    LIVER FUNCTION TESTS: Recent Labs    03/30/23 0930 09/24/23 1714  BILITOT 0.3 2.0*  AST 37 68*  ALT 36* 70*  ALKPHOS 91 107  PROT 7.3 8.1  ALBUMIN 4.7 3.8    Assessment and Plan: 62 year old female with past medical history significant for arthritis, GERD.  Nephrolithiasis, vitamin D deficiency, scoliosis was admitted to [facility] on 12/13 with persistent abdominal pain and occasional nausea and imaging consistent with perforated appendicitis. She now has a small periappendiceal abscess ; s/p RLQ drain placement 12/18 (10 fr to JP); afebrile, WBC 11.5(11.7), hgb nl, creat nl, drain fl cx pend, K 3.1- replace   Output by Drain (mL) 09/28/23 0701 - 09/28/23 1900 09/28/23 1901 - 09/29/23 0700 09/29/23 0701 - 09/29/23 1900 09/29/23 1901 - 09/30/23 0700 09/30/23 0701 - 09/30/23 1145  Closed System Drain 1 Right;Anterior RLQ Bulb (JP) 10 Fr.   25 25    Cont current tx/drain flushes /close output monitoring, lab checks; once drain output minimal or if WBC increases obtain f/u CT; other plans as per [facility]   Electronically Signed: D. Jeananne Rama, PA-C 09/30/2023, 11:40 AM   I spent a total of 15 Minutes at the the patient's bedside AND on the patient's hospital floor or unit, greater than 50% of which was counseling/coordinating care for right lower abdominal abscess drain    Patient ID: Sarah Hicks, female   DOB: 1961-07-01, 62 y.o.   MRN: [id number]

## 2023-10-01 MED ORDER — POLYETHYLENE GLYCOL 3350 17 G PO PACK
17.0000 g | PACK | Freq: Every day | ORAL | Status: DC
  Administered 2023-10-01 – 2023-10-03 (×3): 17 g via ORAL
  Filled 2023-10-01 (×4): qty 1

## 2023-10-01 MED ORDER — FLUCONAZOLE 100 MG PO TABS
200.0000 mg | ORAL_TABLET | Freq: Once | ORAL | Status: AC
  Administered 2023-10-01: 200 mg via ORAL
  Filled 2023-10-01: qty 2

## 2023-10-01 MED ORDER — KCL IN DEXTROSE-NACL 20-5-0.45 MEQ/L-%-% IV SOLN
INTRAVENOUS | Status: DC
  Filled 2023-10-01 (×2): qty 1000

## 2023-10-01 MED ORDER — POTASSIUM CHLORIDE CRYS ER 20 MEQ PO TBCR
40.0000 meq | EXTENDED_RELEASE_TABLET | Freq: Two times a day (BID) | ORAL | Status: AC
  Administered 2023-10-01 (×2): 40 meq via ORAL
  Filled 2023-10-01 (×2): qty 2

## 2023-10-01 MED ORDER — NYSTATIN 100000 UNIT/ML MT SUSP
5.0000 mL | Freq: Four times a day (QID) | OROMUCOSAL | Status: AC
  Administered 2023-10-01 – 2023-10-03 (×12): 500000 [IU] via ORAL
  Filled 2023-10-01 (×11): qty 5

## 2023-10-01 NOTE — Plan of Care (Signed)
  Problem: Activity: Goal: Risk for activity intolerance will decrease Outcome: Completed/Met   Problem: Pain Management: Goal: General experience of comfort will improve Outcome: Progressing

## 2023-10-01 NOTE — Progress Notes (Signed)
Mobility Specialist - Progress Note   10/01/23 0928  Mobility  Activity Ambulated with assistance in hallway  Level of Assistance Modified independent, requires aide device or extra time  Assistive Device Other (Comment) (IV Pole)  Distance Ambulated (ft) 250 ft  Activity Response Tolerated well  Mobility Referral Yes  Mobility visit 1 Mobility  Mobility Specialist Start Time (ACUTE ONLY) [id number]  Mobility Specialist Stop Time (ACUTE ONLY) [id number]  Mobility Specialist Time Calculation (min) (ACUTE ONLY) 11 min   Pt received in bed and agreeable to mobility. No complaints during session. Pt to bed after session with all needs met.    [facility]

## 2023-10-01 NOTE — Plan of Care (Signed)
  Problem: Education: Goal: Knowledge of General Education information will improve Description: Including pain rating scale, medication(s)/side effects and non-pharmacologic comfort measures Outcome: Progressing   Problem: Coping: Goal: Level of anxiety will decrease Outcome: Progressing   Problem: Pain Management: Goal: General experience of comfort will improve Outcome: Progressing

## 2023-10-01 NOTE — Progress Notes (Signed)
Referring Physician(s): Allen,S  Supervising Physician: Simonne Come  Patient Status:  [facility] - In-pt  Chief Complaint:  Abdominal pain/periappendiceal abscess   Subjective: Patient doing okay today; continues to have some mild right lower quadrant discomfort; denies fever, nausea, vomiting   Allergies: Latex, Morphine, and Tape  Medications: Prior to Admission medications   Medication Sig Start Date End Date Taking? Authorizing Provider  acetaminophen (TYLENOL) 500 MG tablet Take 1,000 mg by mouth daily as needed for moderate pain or headache.   Yes [provider]  ADVIL 200 MG CAPS Take 200-400 mg by mouth every 6 (six) hours as needed (for pain or headaches).   Yes [provider]  Ascorbic Acid (VITAMIN C PO) Take 1 tablet by mouth daily.   Yes [provider]  Cholecalciferol (VITAMIN D3 PO) Take 1 capsule by mouth daily.   Yes [provider]  L-LYSINE PO Take 1 tablet by mouth daily.   Yes [provider]  loratadine (CLARITIN) 10 MG tablet Take 10 mg by mouth daily as needed for allergies (if not taking Zyrtec).   Yes [provider]  Multiple Vitamins-Minerals (ONE-A-DAY 50 PLUS PO) Take 1 tablet by mouth daily with breakfast.   Yes [provider]  Omega-3 Fatty Acids (FISH OIL PO) Take 1 capsule by mouth daily.   Yes [provider]  Probiotic Product (PROBIOTIC DAILY PO) Take 1 capsule by mouth daily.   Yes [provider]  ZYRTEC ALLERGY 10 MG tablet Take 10 mg by mouth daily as needed for allergies or rhinitis (if not taking Loratadine).   Yes [provider]  Cholecalciferol 125 MCG (5000 UT) capsule Take 1 capsule (5,000 Units total) by mouth daily. Patient not taking: Reported on 09/24/2023 09/06/18   Adline Potter, NP  rosuvastatin (CRESTOR) 10 MG tablet Take 1 tablet daily at bedtime Patient not taking: Reported on 09/24/2023 03/31/23   Adline Potter, NP      Vital Signs: BP (!) 144/76 (BP Location: Left Arm)   Pulse 85   Temp 97.9 F (36.6 C) (Oral)   Resp 20   Ht 5\' 5"  (1.651 m)   Wt 240 lb (108.9 kg)   LMP 04/13/2013   SpO2 96%   BMI 39.94 kg/m   Physical Exam awake, alert.  Right lower quadrant drain intact, insertion site okay, mildly tender to palpation, output 20 cc yesterday, 5 to 10 cc today.  Drain flushed today with return of serosanguineous fluid with some tissue fragments, some discomfort with saline flushing  Imaging: CT GUIDED PERITONEAL/RETROPERITONEAL FLUID DRAIN BY PERC CATH Result Date: 09/29/2023 INDICATION: Right lower quadrant ruptured appendicitis with abscess EXAM: CT DRAINAGE RIGHT LOWER QUADRANT ABSCESS MEDICATIONS: The patient is currently admitted to the hospital and receiving intravenous antibiotics. The antibiotics were administered within an appropriate time frame prior to the initiation of the procedure. ANESTHESIA/SEDATION: Moderate (conscious) sedation was employed during this procedure. A total of Versed 2.0 mg and Fentanyl 100 mcg was administered intravenously by the radiology nurse. Total intra-service moderate Sedation Time: 15 minutes. The patient's level of consciousness and vital signs were monitored continuously by radiology nursing throughout the procedure under my direct supervision. COMPLICATIONS: None immediate. PROCEDURE: Informed written consent was obtained from the patient after a thorough discussion of the procedural risks, benefits and alternatives. All questions were addressed. Maximal Sterile Barrier Technique was utilized including caps, mask, sterile gowns, sterile gloves, sterile drape, hand hygiene and skin antiseptic. A timeout was performed  prior to the initiation of the procedure. Previous imaging reviewed. Patient positioned supine. Noncontrast localization CT performed. The right lower quadrant abscess was localized and marked for a lateral approach. Under sterile conditions and  local anesthesia, an 18 gauge 15 cm access was advanced from a lateral approach into the abscess. Needle position confirmed with CT. Syringe aspiration yielded purulent fluid. Guidewire coiled within the abscess and confirmed with CT. Tract dilatation performed to insert a 10 [place] drain. Drain catheter position confirmed with CT. Syringe aspiration yielded 25 cc purulent fluid. Sample sent for culture. Catheter secured with Prolene suture and connected to external suction bulb. Sterile dressing applied. No immediate complication. Patient tolerated the procedure well. IMPRESSION: Successful CT-guided right lower quadrant abscess drain. Electronically Signed   By: Judie Petit.  Shick M.D.   On: 09/29/2023 16:23   CT ABDOMEN PELVIS W CONTRAST Result Date: 09/29/2023 CLINICAL DATA:  Perforated appendicitis.  Evaluate for abscess. EXAM: CT ABDOMEN AND PELVIS WITH CONTRAST TECHNIQUE: Multidetector CT imaging of the abdomen and pelvis was performed using the standard protocol following bolus administration of intravenous contrast. RADIATION DOSE REDUCTION: This exam was performed according to the departmental dose-optimization program which includes automated exposure control, adjustment of the mA and/or kV according to patient size and/or use of iterative reconstruction technique. CONTRAST:  OMNIPAQUE IOHEXOL 300 MG/ML  SOLN COMPARISON:  09/24/2023 FINDINGS: Lower chest: Trace dependent atelectasis.  No pleural effusion. Hepatobiliary: No suspicious focal abnormality within the liver parenchyma. Gallbladder is surgically absent. No intrahepatic or extrahepatic biliary dilation. Pancreas: No focal mass lesion. No dilatation of the main duct. No intraparenchymal cyst. No peripancreatic edema. Spleen: No splenomegaly. No suspicious focal mass lesion. Adrenals/Urinary Tract: No adrenal nodule or mass. Right kidney unremarkable. 5 x 9 mm nonobstructing stone interpolar left kidney. No evidence for hydroureter. The urinary  bladder appears normal for the degree of distention. Stomach/Bowel: Stomach is unremarkable. No gastric wall thickening. No evidence of outlet obstruction. Duodenum is normally positioned as is the ligament of Treitz. No small bowel obstruction. The wall of the terminal ileum is irregular and mildly thickened, likely secondary. The tip of the appendix is no longer discretely visible. The 3.5 x 1.6 cm right lower quadrant abscess seen previously is now 4.4 x 2.5 cm on image 64/2, likely incorporating the tip of the appendix with 9 mm stone seen in the rim enhancing fluid collection (axial 66/2 and coronal 47/4). No gross colonic mass. No colonic wall thickening. Vascular/Lymphatic: There is mild atherosclerotic calcification of the abdominal aorta without aneurysm. There is no gastrohepatic or hepatoduodenal ligament lymphadenopathy. No retroperitoneal or mesenteric lymphadenopathy. No pelvic sidewall lymphadenopathy. Reproductive: The uterus is unremarkable.  There is no adnexal mass. Other: Trace free fluid in the cul-de-sac. Musculoskeletal: No worrisome lytic or sclerotic osseous abnormality. Bilateral pars interarticularis defects noted at L5. IMPRESSION: 1. The 3.5 x 1.6 cm right lower quadrant abscess seen previously is now 4.4 x 2.5 cm, likely incorporating the tip of the appendix with 9 mm stone seen in the rim enhancing fluid collection. 2. The wall of the terminal ileum is irregular and mildly thickened, likely secondary. 3. 5 x 9 mm nonobstructing stone interpolar left kidney. 4. Trace free fluid in the cul-de-sac. 5.  Aortic Atherosclerosis (ICD10-I70.0). Electronically Signed   By: Kennith Center M.D.   On: 09/29/2023 13:39    Labs:  CBC: Recent Labs    09/25/23 0531 09/26/23 0842 09/28/23 0453 09/30/23 0755  WBC 18.1* 12.4* 11.7* 11.5*  HGB 12.6  11.8* 11.9* 12.4  HCT 39.5 36.9 37.7 38.3  PLT 208 226 282 320    COAGS: Recent Labs    09/29/23 1107  INR 1.1    BMP: Recent Labs     09/24/23 1714 09/25/23 0531 09/26/23 0842 09/30/23 0755  NA 125* 131* 135 137  K 3.5 3.5 3.7 3.1*  CL 93* 101 107 103  CO2 21* 21* 18* 22  GLUCOSE 127* 108* 71 93  BUN 8 7* 7* 6*  CALCIUM 8.7* 7.9* 8.1* 8.5*  CREATININE 0.86 0.72 0.54 0.55  GFRNONAA >60 >60 >60 >60    LIVER FUNCTION TESTS: Recent Labs    03/30/23 0930 09/24/23 1714  BILITOT 0.3 2.0*  AST 37 68*  ALT 36* 70*  ALKPHOS 91 107  PROT 7.3 8.1  ALBUMIN 4.7 3.8    Assessment and Plan: 62 year old female with past medical history significant for arthritis, GERD.  Nephrolithiasis, vitamin D deficiency, scoliosis was admitted to [facility] on 12/13 with persistent abdominal pain and occasional nausea and imaging consistent with perforated appendicitis. She now has a small periappendiceal abscess ; s/p RLQ drain placement 12/18 (10 fr to JP); afebrile, no new labs today; drain fluid cultures with moderate Streptococcus anginosus  Output by Drain (mL) 09/29/23 0701 - 09/29/23 1900 09/29/23 1901 - 09/30/23 0700 09/30/23 0701 - 09/30/23 1900 09/30/23 1901 - 10/01/23 0700 10/01/23 0701 - 10/01/23 1705  Closed System Drain 1 Right;Anterior RLQ Bulb (JP) 10 Fr. 25 25 12 10 5    Continue current treatment for now, if drain output continues to decline recommend follow-up CT   Electronically Signed: D. Jeananne Rama, PA-C 10/01/2023, 5:00 PM   I spent a total of 15 Minutes at the the patient's bedside AND on the patient's hospital floor or unit, greater than 50% of which was counseling/coordinating care for right lower abdominal abscess     Patient ID: Sarah Hicks, female   DOB: [birth year], 62 y.o.   MRN: [id number]

## 2023-10-01 NOTE — Progress Notes (Addendum)
Subjective: No better, no worse.  Ate minimal yesterday.  Drank about 2-3 cups of water/ginger ale.  Husband says urine is dark.  Patient c/o vaginal itching that she typically gets on abx.  Some sore throat as well.  C/o feeling more bloated again today.  Minimal flatus.  No "BM" in 1 week, but had a small amount of diarrhea earlier this week and nothing since then.  ROS: See above, otherwise other systems negative  Objective: Vital signs in last 24 hours: Temp:  [97.4 F (36.3 C)-98 F (36.7 C)] 98 F (36.7 C) (12/20 0709) Pulse Rate:  [71-96] 93 (12/20 0709) Resp:  [18-20] 20 (12/20 0709) BP: (138-155)/(71-90) 155/90 (12/20 0709) SpO2:  [94 %-96 %] 94 % (12/20 0709) Last BM Date : 09/28/23  Intake/Output from previous day: 12/19 0701 - 12/20 0700 In: 390.5 [P.O.:240; IV Piggyback:150.5] Out: 172 [Urine:150; Drains:22] Intake/Output this shift: Total I/O In: 0  Out: 300 [Urine:300]  PE: Gen: NAD, sitting up on the bench in her room HEENT: throat not fully visible as tongue is in the way.  No overt evidence of thrush but patient says pain is further back Abd: softer, slightly more distended today, JP in place with minimal serosang output.  (22cc)  Lab Results:  Recent Labs    09/30/23 0755  WBC 11.5*  HGB 12.4  HCT 38.3  PLT 320   BMET Recent Labs    09/30/23 0755  NA 137  K 3.1*  CL 103  CO2 22  GLUCOSE 93  BUN 6*  CREATININE 0.55  CALCIUM 8.5*    PT/INR Recent Labs    09/29/23 1107  LABPROT 14.3  INR 1.1   CMP     Component Value Date/Time   NA 137 09/30/2023 0755   NA 140 03/30/2023 0930   K 3.1 (L) 09/30/2023 0755   CL 103 09/30/2023 0755   CO2 22 09/30/2023 0755   GLUCOSE 93 09/30/2023 0755   BUN 6 (L) 09/30/2023 0755   BUN 10 03/30/2023 0930   CREATININE 0.55 09/30/2023 0755   CALCIUM 8.5 (L) 09/30/2023 0755   PROT 8.1 09/24/2023 1714   PROT 7.3 03/30/2023 0930   ALBUMIN 3.8 09/24/2023 1714   ALBUMIN 4.7 03/30/2023 0930    AST 68 (H) 09/24/2023 1714   ALT 70 (H) 09/24/2023 1714   ALKPHOS 107 09/24/2023 1714   BILITOT 2.0 (H) 09/24/2023 1714   BILITOT 0.3 03/30/2023 0930   GFRNONAA >60 09/30/2023 0755   GFRAA 89 12/18/2019 0954   Lipase     Component Value Date/Time   LIPASE 24 09/24/2023 1714       Studies/Results: CT GUIDED PERITONEAL/RETROPERITONEAL FLUID DRAIN BY PERC CATH Result Date: 09/29/2023 INDICATION: Right lower quadrant ruptured appendicitis with abscess EXAM: CT DRAINAGE RIGHT LOWER QUADRANT ABSCESS MEDICATIONS: The patient is currently admitted to the hospital and receiving intravenous antibiotics. The antibiotics were administered within an appropriate time frame prior to the initiation of the procedure. ANESTHESIA/SEDATION: Moderate (conscious) sedation was employed during this procedure. A total of Versed 2.0 mg and Fentanyl 100 mcg was administered intravenously by the radiology nurse. Total intra-service moderate Sedation Time: 15 minutes. The patient's level of consciousness and vital signs were monitored continuously by radiology nursing throughout the procedure under my direct supervision. COMPLICATIONS: None immediate. PROCEDURE: Informed written consent was obtained from the patient after a thorough discussion of the procedural risks, benefits and alternatives. All questions were addressed. Maximal Sterile Barrier Technique was  utilized including caps, mask, sterile gowns, sterile gloves, sterile drape, hand hygiene and skin antiseptic. A timeout was performed prior to the initiation of the procedure. Previous imaging reviewed. Patient positioned supine. Noncontrast localization CT performed. The right lower quadrant abscess was localized and marked for a lateral approach. Under sterile conditions and local anesthesia, an 18 gauge 15 cm access was advanced from a lateral approach into the abscess. Needle position confirmed with CT. Syringe aspiration yielded purulent fluid. Guidewire coiled  within the abscess and confirmed with CT. Tract dilatation performed to insert a 10 [place] drain. Drain catheter position confirmed with CT. Syringe aspiration yielded 25 cc purulent fluid. Sample sent for culture. Catheter secured with Prolene suture and connected to external suction bulb. Sterile dressing applied. No immediate complication. Patient tolerated the procedure well. IMPRESSION: Successful CT-guided right lower quadrant abscess drain. Electronically Signed   By: Judie Petit.  Shick M.D.   On: 09/29/2023 16:23    Anti-infectives: Anti-infectives (From admission, onward)    Start     Dose/Rate Route Frequency Ordered Stop   10/01/23 1030  fluconazole (DIFLUCAN) tablet 200 mg        200 mg Oral  Once 10/01/23 0934     09/24/23 2000  piperacillin-tazobactam (ZOSYN) IVPB 3.375 g        3.375 g 12.5 mL/hr over 240 Minutes Intravenous Every 8 hours 09/24/23 1843 10/01/23 2159   09/24/23 1645  cefTRIAXone (ROCEPHIN) 2 g in sodium chloride 0.9 % 100 mL IVPB        2 g 200 mL/hr over 30 Minutes Intravenous  Once 09/24/23 1637 09/24/23 1759   09/24/23 1645  metroNIDAZOLE (FLAGYL) IVPB 500 mg        500 mg 100 mL/hr over 60 Minutes Intravenous  Once 09/24/23 1637 09/24/23 1900        Assessment/Plan Perforated appendicitis with abscess -soft diet as tolerates.  Drank 1 ensure yesterday -WBC 11.5K -ambulate -IR drain placed on 12/18.  Cultures pending, but currently with no growth -continue conservative management for now, but if patient continues to linger with lack of further improvement, she may require an operation.  FEN - soft, Ensure VTE - Lovenox ID - zosyn  Hypokalemia - K 3.1,  give Kdur today and recheck labs in am Constipation - add miralax GERD - protonix Vaginal candida, possible oral candida - diflucan 200mg  x1 dose, Nystatin solution QID x 3 days.  I reviewed nursing notes, Consultant IR notes, last 24 h vitals and pain scores, last 48 h intake and output, and last 24 h  labs and trends.   LOS: 7 days   [name] , [facility] 10/01/2023, 10:04 AM Please see Amion for pager number during day hours 7:00am-4:30pm or 7:00am -11:30am on weekends

## 2023-10-02 LAB — CBC
HCT: 39.3 % (ref 36.0–46.0)
Hemoglobin: 12.5 g/dL (ref 12.0–15.0)
MCH: 29.4 pg (ref 26.0–34.0)
MCHC: 31.8 g/dL (ref 30.0–36.0)
MCV: 92.5 fL (ref 80.0–100.0)
Platelets: 390 10*3/uL (ref 150–400)
RBC: 4.25 MIL/uL (ref 3.87–5.11)
RDW: 13.5 % (ref 11.5–15.5)
WBC: 10.1 10*3/uL (ref 4.0–10.5)
nRBC: 0 % (ref 0.0–0.2)

## 2023-10-02 LAB — BASIC METABOLIC PANEL
Anion gap: 10 (ref 5–15)
BUN: 5 mg/dL — ABNORMAL LOW (ref 8–23)
CO2: 24 mmol/L (ref 22–32)
Calcium: 8.7 mg/dL — ABNORMAL LOW (ref 8.9–10.3)
Chloride: 106 mmol/L (ref 98–111)
Creatinine, Ser: 0.64 mg/dL (ref 0.44–1.00)
GFR, Estimated: >60 mL/min (ref >60)
Glucose, Bld: 105 mg/dL — ABNORMAL HIGH (ref 70–99)
Potassium: 4.1 mmol/L (ref 3.5–5.1)
Sodium: 140 mmol/L (ref 135–145)

## 2023-10-02 NOTE — Progress Notes (Addendum)
Subjective: Feels a bit better, having some bowel function.  Tolerating a diet   Objective: Vital signs in last 24 hours: Temp:  [97.7 F (36.5 C)-98.5 F (36.9 C)] 97.7 F (36.5 C) (12/21 0600) Pulse Rate:  [81-85] 81 (12/21 0600) Resp:  [18] 18 (12/21 0600) BP: (142-148)/(75-82) 142/75 (12/21 0600) SpO2:  [94 %-96 %] 96 % (12/21 0600) Last BM Date : 09/28/23  Intake/Output from previous day: 12/20 0701 - 12/21 0700 In: 2051.6 [P.O.:900; I.V.:1052.6; IV Piggyback:99] Out: 1110 [Urine:1100; Drains:10] Intake/Output this shift: No intake/output data recorded.  PE: Gen: NAD, sitting up on the bench in her room  Abd: softer, slightly distended today, JP in place with minimal serosang output.   Lab Results:  Recent Labs    09/30/23 0755  WBC 11.5*  HGB 12.4  HCT 38.3  PLT 320   BMET Recent Labs    09/30/23 0755  NA 137  K 3.1*  CL 103  CO2 22  GLUCOSE 93  BUN 6*  CREATININE 0.55  CALCIUM 8.5*    PT/INR Recent Labs    09/29/23 1107  LABPROT 14.3  INR 1.1   CMP     Component Value Date/Time   NA 137 09/30/2023 0755   NA 140 03/30/2023 0930   K 3.1 (L) 09/30/2023 0755   CL 103 09/30/2023 0755   CO2 22 09/30/2023 0755   GLUCOSE 93 09/30/2023 0755   BUN 6 (L) 09/30/2023 0755   BUN 10 03/30/2023 0930   CREATININE 0.55 09/30/2023 0755   CALCIUM 8.5 (L) 09/30/2023 0755   PROT 8.1 09/24/2023 1714   PROT 7.3 03/30/2023 0930   ALBUMIN 3.8 09/24/2023 1714   ALBUMIN 4.7 03/30/2023 0930   AST 68 (H) 09/24/2023 1714   ALT 70 (H) 09/24/2023 1714   ALKPHOS 107 09/24/2023 1714   BILITOT 2.0 (H) 09/24/2023 1714   BILITOT 0.3 03/30/2023 0930   GFRNONAA >60 09/30/2023 0755   GFRAA 89 12/18/2019 0954   Lipase     Component Value Date/Time   LIPASE 24 09/24/2023 1714       Studies/Results: No results found.   Anti-infectives: Anti-infectives (From admission, onward)    Start     Dose/Rate Route Frequency Ordered Stop   10/01/23 1030   fluconazole (DIFLUCAN) tablet 200 mg        200 mg Oral  Once 10/01/23 0934 10/01/23 1006   09/24/23 2000  piperacillin-tazobactam (ZOSYN) IVPB 3.375 g        3.375 g 12.5 mL/hr over 240 Minutes Intravenous Every 8 hours 09/24/23 1843 10/01/23 1700   09/24/23 1645  cefTRIAXone (ROCEPHIN) 2 g in sodium chloride 0.9 % 100 mL IVPB        2 g 200 mL/hr over 30 Minutes Intravenous  Once 09/24/23 1637 09/24/23 1759   09/24/23 1645  metroNIDAZOLE (FLAGYL) IVPB 500 mg        500 mg 100 mL/hr over 60 Minutes Intravenous  Once 09/24/23 1637 09/24/23 1900        Assessment/Plan Perforated appendicitis with abscess -reg diet as tolerates.  -WBC stable -ambulate -IR drain placed on 12/18.  Cultures growing strep, awaiting sensitivities -continue conservative management for now FEN - Reg diet, Ensure VTE - Lovenox ID - zosyn, completed 7 day course Constipation - cont miralax GERD - protonix Vaginal candida, possible oral candida - diflucan 200mg  x1 dose, Nystatin solution QID x 3 days.  I reviewed nursing notes, Consultant IR notes, last  24 h vitals and pain scores, last 48 h intake and output, and last 24 h labs and trends.   LOS: 8 days   Sarah Panda, MD  Colorectal and General Surgery [facility]

## 2023-10-03 NOTE — Plan of Care (Signed)
  Problem: Clinical Measurements: Goal: Will remain free from infection Outcome: Progressing Goal: Diagnostic test results will improve Outcome: Progressing   

## 2023-10-03 NOTE — Progress Notes (Signed)
Subjective: Feels a bit better, having better bowel function.  Tolerating a diet   Objective: Vital signs in last 24 hours: Temp:  [97.6 F (36.4 C)-98.3 F (36.8 C)] 97.6 F (36.4 C) (12/22 0545) Pulse Rate:  [78-87] 78 (12/22 0545) Resp:  [16-18] 18 (12/22 0545) BP: (139-158)/(74-83) 141/74 (12/22 0545) SpO2:  [96 %-97 %] 96 % (12/22 0545) Last BM Date : 10/02/23  Intake/Output from previous day: 12/21 0701 - 12/22 0700 In: 497 [P.O.:487; I.V.:5] Out: 1105 [Urine:1100; Drains:5] Intake/Output this shift: No intake/output data recorded.  PE: Gen: NAD, sitting up on the bench in her room  Abd: softer, non- distended today, JP in place with minimal serosang output.   Lab Results:  Recent Labs    10/02/23 0518  WBC 10.1  HGB 12.5  HCT 39.3  PLT 390   BMET Recent Labs    10/02/23 0518  NA 140  K 4.1  CL 106  CO2 24  GLUCOSE 105*  BUN 5*  CREATININE 0.64  CALCIUM 8.7*    PT/INR No results for input(s): "LABPROT", "INR" in the last 72 hours.  CMP     Component Value Date/Time   NA 140 10/02/2023 0518   NA 140 03/30/2023 0930   K 4.1 10/02/2023 0518   CL 106 10/02/2023 0518   CO2 24 10/02/2023 0518   GLUCOSE 105 (H) 10/02/2023 0518   BUN 5 (L) 10/02/2023 0518   BUN 10 03/30/2023 0930   CREATININE 0.64 10/02/2023 0518   CALCIUM 8.7 (L) 10/02/2023 0518   PROT 8.1 09/24/2023 1714   PROT 7.3 03/30/2023 0930   ALBUMIN 3.8 09/24/2023 1714   ALBUMIN 4.7 03/30/2023 0930   AST 68 (H) 09/24/2023 1714   ALT 70 (H) 09/24/2023 1714   ALKPHOS 107 09/24/2023 1714   BILITOT 2.0 (H) 09/24/2023 1714   BILITOT 0.3 03/30/2023 0930   GFRNONAA >60 10/02/2023 0518   GFRAA 89 12/18/2019 0954   Lipase     Component Value Date/Time   LIPASE 24 09/24/2023 1714       Studies/Results: No results found.   Anti-infectives: Anti-infectives (From admission, onward)    Start     Dose/Rate Route Frequency Ordered Stop   10/01/23 1030  fluconazole  (DIFLUCAN) tablet 200 mg        200 mg Oral  Once 10/01/23 0934 10/01/23 1006   09/24/23 2000  piperacillin-tazobactam (ZOSYN) IVPB 3.375 g        3.375 g 12.5 mL/hr over 240 Minutes Intravenous Every 8 hours 09/24/23 1843 10/01/23 1700   09/24/23 1645  cefTRIAXone (ROCEPHIN) 2 g in sodium chloride 0.9 % 100 mL IVPB        2 g 200 mL/hr over 30 Minutes Intravenous  Once 09/24/23 1637 09/24/23 1759   09/24/23 1645  metroNIDAZOLE (FLAGYL) IVPB 500 mg        500 mg 100 mL/hr over 60 Minutes Intravenous  Once 09/24/23 1637 09/24/23 1900        Assessment/Plan Perforated appendicitis with abscess -reg diet  -WBC stable -ambulate -IR drain placed on 12/18.  Cultures growing strep but has completed course of Zosyn -continue conservative management for now FEN - Reg diet, Ensure VTE - Lovenox ID - zosyn, completed 7 day course Constipation - cont miralax GERD - protonix Vaginal candida, possible oral candida - diflucan 200mg  x1 dose, Nystatin solution QID x 3 days. Dispo: Pt's drain appears like it may be able to come out.  Will have IR assess and possible d/c tomorrow   I reviewed nursing notes, Consultant IR notes, last 24 h vitals and pain scores, last 48 h intake and output, and last 24 h labs and trends.   LOS: 9 days   Vanita Panda, MD  Colorectal and General Surgery [facility]

## 2023-10-04 ENCOUNTER — Inpatient Hospital Stay (HOSPITAL_COMMUNITY): Payer: 59

## 2023-10-04 LAB — AEROBIC/ANAEROBIC CULTURE W GRAM STAIN (SURGICAL/DEEP WOUND): Special Requests: NORMAL

## 2023-10-04 MED ORDER — IOHEXOL 300 MG/ML  SOLN
100.0000 mL | Freq: Once | INTRAMUSCULAR | Status: AC | PRN
  Administered 2023-10-04: 100 mL via INTRAVENOUS

## 2023-10-04 NOTE — Progress Notes (Signed)
   Subjective/Chief Complaint: No complaints Tolerating po Drain with 10 cc out last 24 hours   Objective: Vital signs in last 24 hours: Temp:  [98.3 F (36.8 C)-98.7 F (37.1 C)] 98.4 F (36.9 C) (12/23 0556) Pulse Rate:  [82-87] 82 (12/23 0556) Resp:  [16-17] 16 (12/23 0556) BP: (137-147)/(73-94) 137/73 (12/23 0556) SpO2:  [96 %-97 %] 96 % (12/23 0556) Last BM Date : 10/04/23  Intake/Output from previous day: 12/22 0701 - 12/23 0700 In: 855 [P.O.:840; I.V.:15] Out: 1510 [Urine:1500; Drains:10] Intake/Output this shift: No intake/output data recorded.  Exam: Awake and alert Comfortable Abdomen soft, obese, mildly tender RLQ Drain output darker  Lab Results:  Recent Labs    10/02/23 0518  WBC 10.1  HGB 12.5  HCT 39.3  PLT 390   BMET Recent Labs    10/02/23 0518  NA 140  K 4.1  CL 106  CO2 24  GLUCOSE 105*  BUN 5*  CREATININE 0.64  CALCIUM 8.7*   PT/INR No results for input(s): "LABPROT", "INR" in the last 72 hours. ABG No results for input(s): "PHART", "HCO3" in the last 72 hours.  Invalid input(s): "PCO2", "PO2"  Studies/Results: No results found.  Anti-infectives: Anti-infectives (From admission, onward)    Start     Dose/Rate Route Frequency Ordered Stop   10/01/23 1030  fluconazole (DIFLUCAN) tablet 200 mg        200 mg Oral  Once 10/01/23 0934 10/01/23 1006   09/24/23 2000  piperacillin-tazobactam (ZOSYN) IVPB 3.375 g        3.375 g 12.5 mL/hr over 240 Minutes Intravenous Every 8 hours 09/24/23 1843 10/01/23 1700   09/24/23 1645  cefTRIAXone (ROCEPHIN) 2 g in sodium chloride 0.9 % 100 mL IVPB        2 g 200 mL/hr over 30 Minutes Intravenous  Once 09/24/23 1637 09/24/23 1759   09/24/23 1645  metroNIDAZOLE (FLAGYL) IVPB 500 mg        500 mg 100 mL/hr over 60 Minutes Intravenous  Once 09/24/23 1637 09/24/23 1900       Assessment/Plan: Perforated appendicitis with abscess  -drain output down but consistency has changed.   Will  repeat CT abd/pelvis today.  May need drain study to evaluate for a fistula.  FEN - Reg diet, Ensure VTE - Lovenox ID - zosyn, completed 7 day course Constipation - cont miralax GERD - protonix Vaginal candida, possible oral candida - diflucan 200mg  x1 dose, Nystatin solution QID x 3 days.   Abigail Miyamoto MD 10/04/2023

## 2023-10-04 NOTE — Plan of Care (Signed)
  Problem: Education: Goal: Knowledge of General Education information will improve Description Including pain rating scale, medication(s)/side effects and non-pharmacologic comfort measures Outcome: Progressing   Problem: Health Behavior/Discharge Planning: Goal: Ability to manage health-related needs will improve Outcome: Progressing   Problem: Clinical Measurements: Goal: Ability to maintain clinical measurements within normal limits will improve Outcome: Progressing Goal: Respiratory complications will improve Outcome: Progressing   Problem: Coping: Goal: Level of anxiety will decrease Outcome: Progressing   

## 2023-10-05 LAB — CBC
HCT: 43.9 % (ref 36.0–46.0)
Hemoglobin: 13.8 g/dL (ref 12.0–15.0)
MCH: 28.9 pg (ref 26.0–34.0)
MCHC: 31.4 g/dL (ref 30.0–36.0)
MCV: 92 fL (ref 80.0–100.0)
Platelets: 390 10*3/uL (ref 150–400)
RBC: 4.77 MIL/uL (ref 3.87–5.11)
RDW: 13.2 % (ref 11.5–15.5)
WBC: 11.1 10*3/uL — ABNORMAL HIGH (ref 4.0–10.5)
nRBC: 0 % (ref 0.0–0.2)

## 2023-10-05 MED ORDER — ONDANSETRON HCL 4 MG PO TABS: 4.0000 mg | ORAL_TABLET | Freq: Three times a day (TID) | ORAL | 0 refills | Status: AC | PRN

## 2023-10-05 MED ORDER — AMOXICILLIN-POT CLAVULANATE 875-125 MG PO TABS: 1.0000 | ORAL_TABLET | Freq: Two times a day (BID) | ORAL | 0 refills | Status: AC

## 2023-10-05 MED ORDER — TRAMADOL HCL 50 MG PO TABS: 50.0000 mg | ORAL_TABLET | Freq: Four times a day (QID) | ORAL | 0 refills | Status: DC | PRN

## 2023-10-05 NOTE — Progress Notes (Signed)
Patient discharged home, IV removed, JP drain removed by MD Magnus Ivan prior to discharge, discharge paperwork provided and explained to patient as well as patient's husband, patient as well as patient's husband verbalized understanding.

## 2023-10-05 NOTE — Discharge Summary (Signed)
Physician Discharge Summary  Patient ID: Sarah Hicks MRN: [id number] DOB/AGE: 06-29-1961 62 y.o.  Admit date: 09/24/2023 Discharge date: 10/05/2023  Admission Diagnoses:  Discharge Diagnoses:  Principal Problem:   Appendicitis with perforation   Discharged Condition: good  Hospital Course: Patient admitted with acute appendicitis which was perforated showing an abscess and appendicolith on CT scan.  The patient was placed on antibiotics and interventional radiology was asked to see the patient and subsequently placed a drain in the right lower quadrant.  Over the neck several days her white blood count improved.  The drainage decreased significantly in the drain.  Her repeat CT scan on 12/23 showed resolution of the abscess.  At this point, she was doing well.  She was having bowel movements and tolerating regular diet and had no fever.  The decision was made to remove the drain and discharge patient to home.  Consults: Interventional radiology  Significant Diagnostic Studies:   Treatments: IR placed intra-abdominal drain and abscess, IV antibiotics  Discharge Exam: Blood pressure 127/68, pulse 82, temperature 98.1 F (36.7 C), temperature source Oral, resp. rate 16, height 5\' 5"  (1.651 m), weight 108.9 kg, last menstrual period 04/13/2013, SpO2 95%. She appears well on exam Lungs are clear Cardiovascular regular rate and rhythm Abdomen is soft and nontender Drain is serosanguineous.  It was removed and a dry dressing was placed  Disposition: Discharge disposition: 01-Home or Self Care        Allergies as of 10/05/2023       Reactions   Latex Rash   Morphine Nausea And Vomiting   Tape Rash, Other (See Comments)   No tapes that contain latex        Medication List     TAKE these medications    acetaminophen 500 MG tablet Commonly known as: TYLENOL Take 1,000 mg by mouth daily as needed for moderate pain or headache.   Advil 200 MG Caps Generic drug:  Ibuprofen Take 200-400 mg by mouth every 6 (six) hours as needed (for pain or headaches).   amoxicillin-clavulanate 875-125 MG tablet Commonly known as: AUGMENTIN Take 1 tablet by mouth 2 (two) times daily for 5 days.   VITAMIN D3 PO Take 1 capsule by mouth daily.   Cholecalciferol 125 MCG (5000 UT) capsule Take 1 capsule (5,000 Units total) by mouth daily.   FISH OIL PO Take 1 capsule by mouth daily.   L-LYSINE PO Take 1 tablet by mouth daily.   loratadine 10 MG tablet Commonly known as: CLARITIN Take 10 mg by mouth daily as needed for allergies (if not taking Zyrtec).   ondansetron 4 MG tablet Commonly known as: Zofran Take 1 tablet (4 mg total) by mouth every 8 (eight) hours as needed for nausea or vomiting.   ONE-A-DAY 50 PLUS PO Take 1 tablet by mouth daily with breakfast.   PROBIOTIC DAILY PO Take 1 capsule by mouth daily.   rosuvastatin 10 MG tablet Commonly known as: Crestor Take 1 tablet daily at bedtime   traMADol 50 MG tablet Commonly known as: Ultram Take 1 tablet (50 mg total) by mouth every 6 (six) hours as needed for moderate pain (pain score 4-6) or severe pain (pain score 7-10).   VITAMIN C PO Take 1 tablet by mouth daily.   ZyrTEC Allergy 10 MG tablet Generic drug: cetirizine Take 10 mg by mouth daily as needed for allergies or rhinitis (if not taking Loratadine).        Follow-up Information  Fritzi Mandes, MD. Schedule an appointment as soon as possible for a visit in 3 week(s).   Specialty: General Surgery Contact information: [address] [place] [address] [phone]                 Signed: Abigail Miyamoto 10/05/2023, 8:05 AM

## 2023-10-05 NOTE — Discharge Instructions (Signed)
You may shower  Cover the drain site with a dry gauze and change as needed.  This should stop draining in 2 to 3 days  You may take Advil and Tylenol also for pain  Call the office should she develop any fever or increasing abdominal pain at [phone]

## 2023-10-05 NOTE — Progress Notes (Signed)
Patient ID: Sarah Hicks, female   DOB: Mar 08, 1961, 62 y.o.   MRN: [id number]   CT scans yesterday showed resolution of the right lower quadrant abdominal abscess.  The drain is only put out 5 cc over the last 24 hours.  I removed the drain without incidence  She reports she is feeling well and is ready to go home.  Her abdomen is soft and nontender  Plan: Discharge home

## 2023-10-18 ENCOUNTER — Ambulatory Visit: Payer: 59 | Admitting: Adult Health

## 2023-11-08 ENCOUNTER — Other Ambulatory Visit: Payer: Self-pay | Admitting: Surgery

## 2023-11-08 DIAGNOSIS — K3532 Acute appendicitis with perforation and localized peritonitis, without abscess: Secondary | ICD-10-CM

## 2023-11-16 ENCOUNTER — Ambulatory Visit
Admission: RE | Admit: 2023-11-16 | Discharge: 2023-11-16 | Disposition: A | Discharge status: Home / Self Care | Payer: Medicare HMO | Source: Ambulatory Visit | Attending: Surgery

## 2023-11-16 DIAGNOSIS — K3532 Acute appendicitis with perforation and localized peritonitis, without abscess: Secondary | ICD-10-CM

## 2023-11-16 MED ORDER — IOPAMIDOL (ISOVUE-300) INJECTION 61%
100.0000 mL | Freq: Once | INTRAVENOUS | Status: AC | PRN
  Administered 2023-11-16: 100 mL via INTRAVENOUS

## 2023-11-24 ENCOUNTER — Telehealth: Payer: Self-pay | Admitting: Adult Health

## 2023-11-24 ENCOUNTER — Other Ambulatory Visit: Payer: Self-pay | Admitting: Adult Health

## 2023-11-24 DIAGNOSIS — N950 Postmenopausal bleeding: Secondary | ICD-10-CM

## 2023-11-24 NOTE — Telephone Encounter (Signed)
Sarah Hicks aware that I received Message from Dr Freida Busman about her having vaginal bleeding and thickened endometrium on CT. She had perforated appendix in December. I have made appt for 2/29/25 for pelvic US and see me after. She is aware if the endometrium is thickened on [place] will need biopsy.

## 2023-11-24 NOTE — Progress Notes (Signed)
Will get pelvic US 12/01/23 at 11:30 am in office to assess PMB and thickened endometrium on CT

## 2023-12-01 ENCOUNTER — Ambulatory Visit: Payer: Medicare HMO | Admitting: Adult Health

## 2023-12-01 ENCOUNTER — Other Ambulatory Visit: Payer: Medicare HMO

## 2023-12-07 ENCOUNTER — Ambulatory Visit: Payer: Medicare HMO | Admitting: Radiology

## 2023-12-07 DIAGNOSIS — N950 Postmenopausal bleeding: Secondary | ICD-10-CM | POA: Diagnosis not present

## 2023-12-07 NOTE — Progress Notes (Signed)
 Gyn Ultrasound: TA and TV imaging performed - Chaperone: Quila - vinyl probe cover used Anteverted uterus normal in size, no evidence of fibroids, symmetrical myometrial walls Endometrial thickness = 6.6 mm with irregular ill-defined margins Avascular cavity and canal, no evidence of intracavitary focal soft tissue defects Atrophic normal ovaries, mobile - neg adnexal regions - neg CDS - no free fluid present

## 2023-12-08 ENCOUNTER — Telehealth: Payer: Self-pay | Admitting: Adult Health

## 2023-12-08 NOTE — Telephone Encounter (Signed)
 Pt aware of [place] and that endometrium thickened, will get endometrial biopsy. 12/24/23 with Dr Despina Hidden

## 2023-12-24 ENCOUNTER — Ambulatory Visit: Payer: Medicare HMO | Admitting: Obstetrics & Gynecology

## 2023-12-24 ENCOUNTER — Encounter: Payer: Self-pay | Admitting: Obstetrics & Gynecology

## 2023-12-24 ENCOUNTER — Other Ambulatory Visit (HOSPITAL_COMMUNITY)
Admission: RE | Admit: 2023-12-24 | Discharge: 2023-12-24 | Disposition: A | Discharge status: Home / Self Care | Source: Ambulatory Visit | Attending: Obstetrics & Gynecology | Admitting: Obstetrics & Gynecology

## 2023-12-24 VITALS — BP 156/82 | HR 84

## 2023-12-24 DIAGNOSIS — N950 Postmenopausal bleeding: Secondary | ICD-10-CM | POA: Insufficient documentation

## 2023-12-24 DIAGNOSIS — R9389 Abnormal findings on diagnostic imaging of other specified body structures: Secondary | ICD-10-CM

## 2023-12-24 DIAGNOSIS — E7800 Pure hypercholesterolemia, unspecified: Secondary | ICD-10-CM

## 2023-12-24 NOTE — Addendum Note (Signed)
 Addended by: Annamarie Dawley on: 12/24/2023 11:32 AM   Modules accepted: Orders

## 2023-12-24 NOTE — Progress Notes (Signed)
 Endometrial Biopsy Procedure Note  Pre-operative Diagnosis: Post menopausal bleeding with thickened/complex endometrium 6.6 mm on sonogram  Post-operative Diagnosis: same  Indications: postmenopausal bleeding  Procedure Details   Urine pregnancy test was not done.  The risks (including infection, bleeding, pain, and uterine perforation) and benefits of the procedure were explained to the patient and Written informed consent was obtained.  Antibiotic prophylaxis against endocarditis was not indicated.   The patient was placed in the dorsal lithotomy position.  Bimanual exam showed the uterus to be in the neutral position.  A Graves' speculum inserted in the vagina, and the cervix prepped with povidone iodine.  Endocervical curettage with a Kevorkian curette was not performed.   A sharp tenaculum was applied to the anterior lip of the cervix for stabilization.  A sterile uterine sound was used to sound the uterus to a depth of 6.5 cm.  A Pipelle endometrial aspirator was used to sample the endometrium.  Sample was sent for pathologic examination.  Condition: Stable  Complications: None  Plan:  The patient was advised to call for any fever or for prolonged or severe pain or bleeding. She was advised to use OTC analgesics as needed for mild to moderate pain. She was advised to avoid vaginal intercourse for 48 hours or until the bleeding has completely stopped.  Attending Physician Documentation: I performed the endometrial biopsy

## 2023-12-25 LAB — LIPID PANEL WITH LDL/HDL RATIO
Cholesterol, Total: 210 mg/dL — ABNORMAL HIGH (ref 100–199)
HDL: 54 mg/dL (ref >39)
LDL Chol Calc (NIH): 101 mg/dL — ABNORMAL HIGH (ref 0–99)
LDL/HDL Ratio: 1.9 ratio (ref 0.0–3.2)
Triglycerides: 326 mg/dL — ABNORMAL HIGH (ref 0–149)
VLDL Cholesterol Cal: 55 mg/dL — ABNORMAL HIGH (ref 5–40)

## 2023-12-25 LAB — CBC WITH DIFFERENTIAL/PLATELET
Basophils Absolute: 0 10*3/uL (ref 0.0–0.2)
Basos: 0 %
EOS (ABSOLUTE): 0.2 10*3/uL (ref 0.0–0.4)
Eos: 3 %
Hematocrit: 45.7 % (ref 34.0–46.6)
Hemoglobin: 14.9 g/dL (ref 11.1–15.9)
Immature Grans (Abs): 0 10*3/uL (ref 0.0–0.1)
Immature Granulocytes: 0 %
Lymphocytes Absolute: 3.2 10*3/uL — ABNORMAL HIGH (ref 0.7–3.1)
Lymphs: 35 %
MCH: 29.3 pg (ref 26.6–33.0)
MCHC: 32.6 g/dL (ref 31.5–35.7)
MCV: 90 fL (ref 79–97)
Monocytes Absolute: 0.5 10*3/uL (ref 0.1–0.9)
Monocytes: 5 %
Neutrophils Absolute: 5.2 10*3/uL (ref 1.4–7.0)
Neutrophils: 57 %
Platelets: 280 10*3/uL (ref 150–450)
RBC: 5.08 x10E6/uL (ref 3.77–5.28)
RDW: 13.5 % (ref 11.7–15.4)
WBC: 9.2 10*3/uL (ref 3.4–10.8)

## 2023-12-27 LAB — SURGICAL PATHOLOGY

## 2023-12-29 ENCOUNTER — Encounter: Payer: Self-pay | Admitting: Obstetrics & Gynecology

## 2023-12-29 ENCOUNTER — Telehealth: Admitting: Obstetrics & Gynecology

## 2023-12-29 DIAGNOSIS — E7800 Pure hypercholesterolemia, unspecified: Secondary | ICD-10-CM | POA: Diagnosis not present

## 2023-12-29 DIAGNOSIS — N950 Postmenopausal bleeding: Secondary | ICD-10-CM | POA: Diagnosis not present

## 2023-12-29 NOTE — Progress Notes (Signed)
 Mychart connect visit: video + audio Pt is at home  I am in my office Total time 10 minutes   Follow up appointment  EMBx results  Chief Complaint  Patient presents with   Follow-up    Endo BX results    Last menstrual period 04/13/2013.   SURGICAL PATHOLOGY CASE: [id number] PATIENT: Sarah Hicks Surgical Pathology Report     Clinical History: PMB, thickened endometrium (cm)     FINAL MICROSCOPIC DIAGNOSIS:  A. ENDOMETRIUM, BIOPSY: Inactive endometrium. Negative for endometrial intraepithelial neoplasia (EIN) and malignancy. Scant fragment of inflamed endocervix with microglandular hyperplasia.   GROSS DESCRIPTION: Specimen is received in formalin, and consists of a 1.2 x 1.2 x 0.4 cm aggregate of red-brown soft tissue and clotted blood.  Specimen is entirely submitted in 1 cassette.  Lovey Newcomer 12/24/2023)  Final Diagnosis performed by Jimmy Picket, MD.   Electronically signed 12/27/2023    MEDS ordered this encounter: No orders of the defined types were placed in this encounter.   Orders for this encounter: No orders of the defined types were placed in this encounter.   Impression + Management Plan   ICD-10-CM   1. PMB (postmenopausal bleeding): Biopsy benign inactive endometrium no further treatment is necessary  N95.0     2. Hypercholesteremia: reviewed her labs recommend starting with red yeast rice and niacin  E78.00       Follow Up: No follow-ups on file.     All questions were answered.  Past Medical History:  Diagnosis Date   Arthritis    oa   Complication of anesthesia    told to breathe twice after a morton's neuroma surgery 2003, did fine with other surgeries since   GERD (gastroesophageal reflux disease)    Headache    History of kidney stones    passed on own   Kidney stones    Scoliosis    Vitamin D deficiency 03/25/2016    Past Surgical History:  Procedure Laterality Date   ANKLE SURGERY Right 10 year ago   cervical  cryoablation  1998   CHOLECYSTECTOMY  17 years ago   COLONOSCOPY N/A 09/13/2013   Procedure: COLONOSCOPY;  Surgeon: Malissa Hippo, MD;  Location: [facility];  Service: Endoscopy;  Laterality: N/A;  730-moved to 820 Ann to notify pt   COLONOSCOPY N/A 02/27/2021   Procedure: COLONOSCOPY;  Surgeon: Malissa Hippo, MD;  Location: [facility];  Service: Endoscopy;  Laterality: N/A;  am   FOOT SURGERY     Morton's neuroma   HOT HEMOSTASIS  02/27/2021   Procedure: HOT HEMOSTASIS (ARGON PLASMA COAGULATION/BICAP);  Surgeon: Malissa Hippo, MD;  Location: [facility];  Service: Endoscopy;;   IR URETERAL STENT LEFT NEW ACCESS W/O SEP NEPHROSTOMY CATH  09/19/2019   KNEE SURGERY Left 10 years ago   torn ligemant and tendon repair   NEPHROLITHOTOMY Left 09/19/2019   Procedure: NEPHROLITHOTOMY PERCUTANEOUS;  Surgeon: Bjorn Pippin, MD;  Location: [facility];  Service: Urology;  Laterality: Left;   POLYPECTOMY  02/27/2021   Procedure: POLYPECTOMY;  Surgeon: Malissa Hippo, MD;  Location: [facility];  Service: Endoscopy;;   TOTAL KNEE ARTHROPLASTY Right 10/25/2017   Procedure: RIGHT TOTAL KNEE ARTHROPLASTY;  Surgeon: Ollen Gross, MD;  Location: [facility];  Service: Orthopedics;  Laterality: Right;  Adductor Block   TUBAL LIGATION  19 years ago    OB History     Gravida  2   Para  2   Term  2  Preterm      AB      Living  0      SAB      IAB      Ectopic      Multiple      Live Births  0           Allergies  Allergen Reactions   Latex Rash   Morphine Nausea And Vomiting   Tape Rash and Other (See Comments)    No tapes that contain latex    Social History   Socioeconomic History   Marital status: Married    Spouse name: Not on file   Number of children: Not on file   Years of education: Not on file   Highest education level: Not on file  Occupational History   Not on file  Tobacco Use   Smoking status: Never   Smokeless tobacco: Never  Vaping Use   Vaping  status: Never Used  Substance and Sexual Activity   Alcohol use: No   Drug use: No   Sexual activity: Yes    Birth control/protection: Post-menopausal, Surgical    Comment: tubal  Other Topics Concern   Not on file  Social History Narrative   Not on file   Social Drivers of Health   Financial Resource Strain: Low Risk  (03/30/2023)   Overall Financial Resource Strain (CARDIA)    Difficulty of Paying Living Expenses: Not hard at all  Food Insecurity: No Food Insecurity (09/24/2023)   Hunger Vital Sign    Worried About Running Out of Food in the Last Year: Never true    Ran Out of Food in the Last Year: Never true  Transportation Needs: No Transportation Needs (09/24/2023)   PRAPARE - Administrator, Civil Service (Medical): No    Lack of Transportation (Non-Medical): No  Physical Activity: Insufficiently Active (03/30/2023)   Exercise Vital Sign    Days of Exercise per Week: 3 days    Minutes of Exercise per Session: 30 min  Stress: No Stress Concern Present (03/30/2023)   [employer] of Occupational Health - Occupational Stress Questionnaire    Feeling of Stress : Not at all  Social Connections: Socially Integrated (03/30/2023)   Social Connection and Isolation Panel [NHANES]    Frequency of Communication with Friends and Family: More than three times a week    Frequency of Social Gatherings with Friends and Family: More than three times a week    Attends Religious Services: More than 4 times per year    Active Member of [employer] or Organizations: Yes    Attends Engineer, structural: More than 4 times per year    Marital Status: Married    Family History  Problem Relation Age of Onset   Hypertension Mother    Cancer Father        lung   Hypertension Father    Heart disease Maternal Grandmother        CHF   Hypertension Brother    Hypertension Sister    Breast cancer Neg Hx

## 2024-03-30 ENCOUNTER — Ambulatory Visit: Admitting: Adult Health

## 2024-03-30 ENCOUNTER — Other Ambulatory Visit: Payer: Self-pay | Admitting: Adult Health

## 2024-03-30 ENCOUNTER — Ambulatory Visit
Admission: RE | Admit: 2024-03-30 | Discharge: 2024-03-30 | Disposition: A | Discharge status: Home / Self Care | Source: Ambulatory Visit | Attending: Adult Health | Admitting: Adult Health

## 2024-03-30 ENCOUNTER — Encounter: Payer: Self-pay | Admitting: Adult Health

## 2024-03-30 VITALS — BP 137/85 | HR 84 | Ht 65.0 in | Wt 227.0 lb

## 2024-03-30 DIAGNOSIS — Z01419 Encounter for gynecological examination (general) (routine) without abnormal findings: Secondary | ICD-10-CM

## 2024-03-30 DIAGNOSIS — Z1211 Encounter for screening for malignant neoplasm of colon: Secondary | ICD-10-CM

## 2024-03-30 DIAGNOSIS — Z1231 Encounter for screening mammogram for malignant neoplasm of breast: Secondary | ICD-10-CM

## 2024-03-30 DIAGNOSIS — Z1331 Encounter for screening for depression: Secondary | ICD-10-CM | POA: Diagnosis not present

## 2024-03-30 LAB — HEMOCCULT GUIAC POC 1CARD (OFFICE): Fecal Occult Blood, POC: NEGATIVE

## 2024-03-30 NOTE — Progress Notes (Signed)
 Patient ID: Sarah Hicks, female   DOB: [birth year], 63 y.o.   MRN: [id number] History of Present Illness: Sarah Hicks is a 63 year old white female, married, PM in for a well woman gyn exam.     Component Value Date/Time   DIAGPAP  03/30/2023 0842    - Negative for intraepithelial lesion or malignancy (NILM)   DIAGPAP  12/18/2019 0910    - Negative for intraepithelial lesion or malignancy (NILM)   HPVHIGH Negative 03/30/2023 0842   HPVHIGH Negative 12/18/2019 0910   ADEQPAP  03/30/2023 0842    Satisfactory for evaluation; transformation zone component PRESENT.   ADEQPAP  12/18/2019 0910    Satisfactory for evaluation; transformation zone component PRESENT.   PCP is Dr Sarah Hicks    Current Medications, Allergies, Past Medical History, Past Surgical History, Family History and Social History were reviewed in [employer] [facility] electronic medical record.     Review of Systems: Patient denies any headaches, hearing loss, fatigue, blurred vision, shortness of breath, chest pain, abdominal pain, problems with bowel movements, urination, or intercourse. No joint pain or mood swings.  Denies any vaginal bleeding    Physical Exam:BP 137/85 (BP Location: Left Arm, Patient Position: Sitting, Cuff Size: Large)   Pulse 84   Ht 5' 5 (1.651 m)   Wt 227 lb (103 kg)   LMP 04/13/2013   BMI 37.77 kg/m   General:  Well developed, well nourished, no acute distress Skin:  Warm and dry Neck:  Midline trachea, normal thyroid, good ROM, no lymphadenopathy, no carotid bruits heard  Lungs; Clear to auscultation bilaterally Breast:  No dominant palpable mass, retraction, or nipple discharge Cardiovascular: Regular rate and rhythm Abdomen:  Soft, non tender, no hepatosplenomegaly Pelvic:  External genitalia is normal in appearance, no lesions.  The vagina is pale.Urethra has no lesions or masses. The cervix is smooth.  Uterus is felt to be normal size, shape, and contour.  No adnexal masses or tenderness  noted.Bladder is non tender, no masses felt. Rectal: Good sphincter tone, no polyps, or hemorrhoids felt.  Hemoccult negative. Extremities/musculoskeletal:  No swelling or varicosities noted, no clubbing or cyanosis Psych:  No mood changes, alert and cooperative,seems happy AA is 0 Fall risk is low    03/30/2024    8:47 AM 03/30/2023    8:44 AM 03/25/2022   10:06 AM  Depression screen PHQ 2/9  Decreased Interest 0 0 0  Down, Depressed, Hopeless 0 0 0  PHQ - 2 Score 0 0 0  Altered sleeping 0 0 0  Tired, decreased energy 0 0 0  Change in appetite 0 0 0  Feeling bad or failure about yourself  0 0 0  Trouble concentrating 0 0 0  Moving slowly or fidgety/restless 0 0 0  Suicidal thoughts 0 0 0  PHQ-9 Score 0 0 0       03/30/2024    8:47 AM 03/30/2023    8:44 AM 03/25/2022   10:07 AM 03/04/2021    8:38 AM  GAD 7 : Generalized Anxiety Score  Nervous, Anxious, on Edge 0 0 0 0  Control/stop worrying 0 0 0 0  Worry too much - different things 0 0 0 0  Trouble relaxing 0 0 0 0  Restless 0 0 0 0  Easily annoyed or irritable 0 0 0 0  Afraid - awful might happen 0 0 0 0  Total GAD 7 Score 0 0 0 0    Examination chaperoned by Sarah Rodes LPN  Impression and plan: 1. Encounter for well woman exam with routine gynecological exam (Primary) Physical in 1 year Pap in 2027 Get mammogram  Labs with PCP in December  Colonoscopy 2027   2. Encounter for screening fecal occult blood testing  Hemoccult was negative

## 2024-03-31 ENCOUNTER — Ambulatory Visit: Admitting: Adult Health

## 2024-04-05 ENCOUNTER — Ambulatory Visit: Payer: Self-pay | Admitting: Adult Health

## 2024-07-31 ENCOUNTER — Other Ambulatory Visit: Payer: Self-pay | Admitting: Adult Health

## 2024-07-31 MED ORDER — SULFAMETHOXAZOLE-TRIMETHOPRIM 800-160 MG PO TABS
1.0000 | ORAL_TABLET | Freq: Two times a day (BID) | ORAL | 0 refills | Status: AC
Start: 2024-07-31 — End: ?

## 2024-07-31 NOTE — Progress Notes (Signed)
 Rx septra  ds
